# Patient Record
Sex: Male | Born: 1945 | Race: Black or African American | Hispanic: No | Marital: Single | Smoking: Former smoker
Health system: Southern US, Community
[De-identification: ages and names within clinical notes are randomized; demographics above are authoritative.]

## PROBLEM LIST (undated history)

## (undated) DIAGNOSIS — E785 Hyperlipidemia, unspecified: Secondary | ICD-10-CM

## (undated) DIAGNOSIS — I482 Chronic atrial fibrillation, unspecified: Secondary | ICD-10-CM

## (undated) DIAGNOSIS — E119 Type 2 diabetes mellitus without complications: Secondary | ICD-10-CM

## (undated) DIAGNOSIS — I10 Essential (primary) hypertension: Secondary | ICD-10-CM

## (undated) DIAGNOSIS — I428 Other cardiomyopathies: Secondary | ICD-10-CM

## (undated) HISTORY — PX: CARDIAC DEFIBRILLATOR PLACEMENT: SHX171

---

## 2006-01-05 ENCOUNTER — Emergency Department: Payer: Self-pay | Admitting: Emergency Medicine

## 2006-01-05 ENCOUNTER — Other Ambulatory Visit: Payer: Self-pay

## 2015-11-11 ENCOUNTER — Encounter: Payer: Self-pay | Admitting: Emergency Medicine

## 2015-11-11 ENCOUNTER — Inpatient Hospital Stay
Admission: EM | Admit: 2015-11-11 | Discharge: 2015-11-23 | DRG: 357 | Disposition: A | Discharge status: Home / Self Care | Payer: Medicare Other | Attending: Surgery | Admitting: Surgery

## 2015-11-11 ENCOUNTER — Inpatient Hospital Stay: Payer: Medicare Other

## 2015-11-11 ENCOUNTER — Emergency Department: Payer: Medicare Other

## 2015-11-11 ENCOUNTER — Encounter
Admission: EM | Disposition: A | Discharge status: Home / Self Care | Payer: Self-pay | Source: Home / Self Care | Attending: Surgery

## 2015-11-11 DIAGNOSIS — E43 Unspecified severe protein-calorie malnutrition: Secondary | ICD-10-CM | POA: Insufficient documentation

## 2015-11-11 DIAGNOSIS — I509 Heart failure, unspecified: Secondary | ICD-10-CM

## 2015-11-11 DIAGNOSIS — E1122 Type 2 diabetes mellitus with diabetic chronic kidney disease: Secondary | ICD-10-CM | POA: Diagnosis present

## 2015-11-11 DIAGNOSIS — K9189 Other postprocedural complications and disorders of digestive system: Secondary | ICD-10-CM

## 2015-11-11 DIAGNOSIS — I959 Hypotension, unspecified: Secondary | ICD-10-CM | POA: Diagnosis present

## 2015-11-11 DIAGNOSIS — Z7901 Long term (current) use of anticoagulants: Secondary | ICD-10-CM | POA: Diagnosis not present

## 2015-11-11 DIAGNOSIS — E118 Type 2 diabetes mellitus with unspecified complications: Secondary | ICD-10-CM

## 2015-11-11 DIAGNOSIS — I482 Chronic atrial fibrillation, unspecified: Secondary | ICD-10-CM

## 2015-11-11 DIAGNOSIS — I420 Dilated cardiomyopathy: Secondary | ICD-10-CM

## 2015-11-11 DIAGNOSIS — I130 Hypertensive heart and chronic kidney disease with heart failure and stage 1 through stage 4 chronic kidney disease, or unspecified chronic kidney disease: Secondary | ICD-10-CM | POA: Diagnosis present

## 2015-11-11 DIAGNOSIS — N183 Chronic kidney disease, stage 3 (moderate): Secondary | ICD-10-CM | POA: Diagnosis present

## 2015-11-11 DIAGNOSIS — D729 Disorder of white blood cells, unspecified: Secondary | ICD-10-CM

## 2015-11-11 DIAGNOSIS — Z9114 Patient's other noncompliance with medication regimen: Secondary | ICD-10-CM | POA: Diagnosis not present

## 2015-11-11 DIAGNOSIS — Z95810 Presence of automatic (implantable) cardiac defibrillator: Secondary | ICD-10-CM

## 2015-11-11 DIAGNOSIS — E1140 Type 2 diabetes mellitus with diabetic neuropathy, unspecified: Secondary | ICD-10-CM | POA: Diagnosis present

## 2015-11-11 DIAGNOSIS — E876 Hypokalemia: Secondary | ICD-10-CM | POA: Diagnosis present

## 2015-11-11 DIAGNOSIS — K56609 Unspecified intestinal obstruction, unspecified as to partial versus complete obstruction: Secondary | ICD-10-CM

## 2015-11-11 DIAGNOSIS — R140 Abdominal distension (gaseous): Secondary | ICD-10-CM

## 2015-11-11 DIAGNOSIS — K559 Vascular disorder of intestine, unspecified: Secondary | ICD-10-CM

## 2015-11-11 DIAGNOSIS — Z87891 Personal history of nicotine dependence: Secondary | ICD-10-CM | POA: Diagnosis not present

## 2015-11-11 DIAGNOSIS — Z880 Allergy status to penicillin: Secondary | ICD-10-CM

## 2015-11-11 DIAGNOSIS — K5660 Unspecified intestinal obstruction: Secondary | ICD-10-CM | POA: Diagnosis present

## 2015-11-11 DIAGNOSIS — K567 Ileus, unspecified: Secondary | ICD-10-CM | POA: Diagnosis present

## 2015-11-11 DIAGNOSIS — Z79899 Other long term (current) drug therapy: Secondary | ICD-10-CM

## 2015-11-11 DIAGNOSIS — Z833 Family history of diabetes mellitus: Secondary | ICD-10-CM | POA: Diagnosis not present

## 2015-11-11 DIAGNOSIS — E785 Hyperlipidemia, unspecified: Secondary | ICD-10-CM | POA: Diagnosis present

## 2015-11-11 DIAGNOSIS — I10 Essential (primary) hypertension: Secondary | ICD-10-CM

## 2015-11-11 DIAGNOSIS — I5022 Chronic systolic (congestive) heart failure: Secondary | ICD-10-CM

## 2015-11-11 DIAGNOSIS — Z4659 Encounter for fitting and adjustment of other gastrointestinal appliance and device: Secondary | ICD-10-CM

## 2015-11-11 DIAGNOSIS — Z794 Long term (current) use of insulin: Secondary | ICD-10-CM

## 2015-11-11 DIAGNOSIS — I472 Ventricular tachycardia: Secondary | ICD-10-CM | POA: Diagnosis present

## 2015-11-11 DIAGNOSIS — K55029 Acute infarction of small intestine, extent unspecified: Principal | ICD-10-CM

## 2015-11-11 DIAGNOSIS — I493 Ventricular premature depolarization: Secondary | ICD-10-CM | POA: Diagnosis present

## 2015-11-11 DIAGNOSIS — R1030 Lower abdominal pain, unspecified: Secondary | ICD-10-CM

## 2015-11-11 DIAGNOSIS — Z9119 Patient's noncompliance with other medical treatment and regimen: Secondary | ICD-10-CM

## 2015-11-11 HISTORY — DX: Other cardiomyopathies: I42.8

## 2015-11-11 HISTORY — DX: Hyperlipidemia, unspecified: E78.5

## 2015-11-11 HISTORY — DX: Essential (primary) hypertension: I10

## 2015-11-11 HISTORY — PX: PERIPHERAL VASCULAR CATHETERIZATION: SHX172C

## 2015-11-11 HISTORY — DX: Chronic atrial fibrillation, unspecified: I48.20

## 2015-11-11 HISTORY — DX: Type 2 diabetes mellitus without complications: E11.9

## 2015-11-11 LAB — COMPREHENSIVE METABOLIC PANEL
ALK PHOS: 92 U/L (ref 38–126)
ALT: 23 U/L (ref 17–63)
AST: 26 U/L (ref 15–41)
Albumin: 4 g/dL (ref 3.5–5.0)
Anion gap: 7 (ref 5–15)
BUN: 27 mg/dL — AB (ref 6–20)
CALCIUM: 8.8 mg/dL — AB (ref 8.9–10.3)
CO2: 24 mmol/L (ref 22–32)
CREATININE: 1.54 mg/dL — AB (ref 0.61–1.24)
Chloride: 104 mmol/L (ref 101–111)
GFR, EST AFRICAN AMERICAN: 52 mL/min — AB (ref >60)
GFR, EST NON AFRICAN AMERICAN: 45 mL/min — AB (ref >60)
Glucose, Bld: 267 mg/dL — ABNORMAL HIGH (ref 65–99)
Potassium: 4 mmol/L (ref 3.5–5.1)
Sodium: 135 mmol/L (ref 135–145)
Total Bilirubin: 1.6 mg/dL — ABNORMAL HIGH (ref 0.3–1.2)
Total Protein: 7.9 g/dL (ref 6.5–8.1)

## 2015-11-11 LAB — URINALYSIS COMPLETE WITH MICROSCOPIC (ARMC ONLY)
Bilirubin Urine: NEGATIVE
Glucose, UA: 50 mg/dL — AB
Leukocytes, UA: NEGATIVE
Nitrite: NEGATIVE
PH: 5 (ref 5.0–8.0)
SPECIFIC GRAVITY, URINE: 1.015 (ref 1.005–1.030)
SQUAMOUS EPITHELIAL / LPF: NONE SEEN

## 2015-11-11 LAB — LIPASE, BLOOD: Lipase: 26 U/L (ref 11–51)

## 2015-11-11 LAB — CBC
HCT: 41.2 % (ref 40.0–52.0)
Hemoglobin: 13.6 g/dL (ref 13.0–18.0)
MCH: 27.6 pg (ref 26.0–34.0)
MCHC: 33 g/dL (ref 32.0–36.0)
MCV: 83.8 fL (ref 80.0–100.0)
PLATELETS: 175 10*3/uL (ref 150–440)
RBC: 4.92 MIL/uL (ref 4.40–5.90)
RDW: 17.2 % — AB (ref 11.5–14.5)
WBC: 10.7 10*3/uL — AB (ref 3.8–10.6)

## 2015-11-11 LAB — MRSA PCR SCREENING: MRSA BY PCR: NEGATIVE

## 2015-11-11 LAB — APTT: aPTT: 34 seconds (ref 24–36)

## 2015-11-11 LAB — GLUCOSE, CAPILLARY
GLUCOSE-CAPILLARY: 234 mg/dL — AB (ref 65–99)
Glucose-Capillary: 246 mg/dL — ABNORMAL HIGH (ref 65–99)
Glucose-Capillary: 242 mg/dL — ABNORMAL HIGH (ref 65–99)

## 2015-11-11 LAB — PROTIME-INR
INR: 2.28
Prothrombin Time: 24.9 seconds — ABNORMAL HIGH (ref 11.4–15.0)

## 2015-11-11 LAB — TROPONIN I: Troponin I: 0.03 ng/mL (ref <0.03)

## 2015-11-11 LAB — LACTIC ACID, PLASMA: Lactic Acid, Venous: 1.9 mmol/L (ref 0.5–1.9)

## 2015-11-11 LAB — HEPARIN LEVEL (UNFRACTIONATED): HEPARIN UNFRACTIONATED: 2 [IU]/mL — AB (ref 0.30–0.70)

## 2015-11-11 SURGERY — VISCERAL ARTERY INTERVENTION
Anesthesia: Moderate Sedation

## 2015-11-11 MED ORDER — HEPARIN (PORCINE) IN NACL 2-0.9 UNIT/ML-% IJ SOLN
INTRAMUSCULAR | Status: AC
  Filled 2015-11-11: qty 1000

## 2015-11-11 MED ORDER — FAMOTIDINE IN NACL 20-0.9 MG/50ML-% IV SOLN
20.0000 mg | Freq: Two times a day (BID) | INTRAVENOUS | Status: DC
  Administered 2015-11-11 – 2015-11-14 (×6): 20 mg via INTRAVENOUS
  Filled 2015-11-11 (×9): qty 50

## 2015-11-11 MED ORDER — LIDOCAINE-EPINEPHRINE (PF) 1 %-1:200000 IJ SOLN
INTRAMUSCULAR | Status: DC | PRN
  Administered 2015-11-11: 10 mL

## 2015-11-11 MED ORDER — METRONIDAZOLE IN NACL 5-0.79 MG/ML-% IV SOLN
500.0000 mg | Freq: Once | INTRAVENOUS | Status: AC
  Administered 2015-11-11: 500 mg via INTRAVENOUS
  Filled 2015-11-11: qty 100

## 2015-11-11 MED ORDER — DIATRIZOATE MEGLUMINE & SODIUM 66-10 % PO SOLN
15.0000 mL | Freq: Once | ORAL | Status: AC
  Administered 2015-11-11: 15 mL via ORAL

## 2015-11-11 MED ORDER — CIPROFLOXACIN IN D5W 400 MG/200ML IV SOLN
400.0000 mg | Freq: Two times a day (BID) | INTRAVENOUS | Status: DC
  Administered 2015-11-12 – 2015-11-15 (×7): 400 mg via INTRAVENOUS
  Filled 2015-11-11 (×9): qty 200

## 2015-11-11 MED ORDER — CHLORHEXIDINE GLUCONATE CLOTH 2 % EX PADS: 6.0000 | MEDICATED_PAD | Freq: Once | CUTANEOUS | Status: DC

## 2015-11-11 MED ORDER — MORPHINE SULFATE (PF) 2 MG/ML IV SOLN
2.0000 mg | INTRAVENOUS | Status: DC | PRN
  Administered 2015-11-11 – 2015-11-12 (×3): 2 mg via INTRAVENOUS
  Filled 2015-11-11 (×3): qty 1

## 2015-11-11 MED ORDER — ONDANSETRON HCL 4 MG/2ML IJ SOLN
4.0000 mg | Freq: Once | INTRAMUSCULAR | Status: AC
  Administered 2015-11-11: 4 mg via INTRAVENOUS
  Filled 2015-11-11: qty 2

## 2015-11-11 MED ORDER — MORPHINE SULFATE (PF) 4 MG/ML IV SOLN
4.0000 mg | Freq: Once | INTRAVENOUS | Status: AC
  Administered 2015-11-11: 4 mg via INTRAVENOUS
  Filled 2015-11-11: qty 1

## 2015-11-11 MED ORDER — HEPARIN BOLUS VIA INFUSION
4000.0000 [IU] | Freq: Once | INTRAVENOUS | Status: DC
  Filled 2015-11-11: qty 4000

## 2015-11-11 MED ORDER — IOPAMIDOL (ISOVUE-300) INJECTION 61%
100.0000 mL | Freq: Once | INTRAVENOUS | Status: AC | PRN
  Administered 2015-11-11: 100 mL via INTRAVENOUS

## 2015-11-11 MED ORDER — ALTEPLASE 2 MG IJ SOLR
INTRAMUSCULAR | Status: DC | PRN
  Administered 2015-11-11: 4 mg

## 2015-11-11 MED ORDER — FENTANYL CITRATE (PF) 100 MCG/2ML IJ SOLN
INTRAMUSCULAR | Status: AC
  Filled 2015-11-11: qty 4

## 2015-11-11 MED ORDER — CIPROFLOXACIN IN D5W 400 MG/200ML IV SOLN
INTRAVENOUS | Status: AC
  Filled 2015-11-11: qty 200

## 2015-11-11 MED ORDER — CIPROFLOXACIN IN D5W 400 MG/200ML IV SOLN
400.0000 mg | Freq: Once | INTRAVENOUS | Status: AC
  Administered 2015-11-11: 400 mg via INTRAVENOUS
  Filled 2015-11-11: qty 200

## 2015-11-11 MED ORDER — IOPAMIDOL (ISOVUE-300) INJECTION 61%
INTRAVENOUS | Status: DC | PRN
Start: 2015-11-11 — End: 2015-11-11
  Administered 2015-11-11: 55 mL via INTRA_ARTERIAL

## 2015-11-11 MED ORDER — LIDOCAINE-EPINEPHRINE (PF) 1 %-1:200000 IJ SOLN
INTRAMUSCULAR | Status: AC
  Filled 2015-11-11: qty 30

## 2015-11-11 MED ORDER — SODIUM CHLORIDE 0.9 % IV BOLUS (SEPSIS)
1000.0000 mL | Freq: Once | INTRAVENOUS | Status: AC
  Administered 2015-11-11: 1000 mL via INTRAVENOUS

## 2015-11-11 MED ORDER — SODIUM CHLORIDE 0.9 % IV SOLN
INTRAVENOUS | Status: DC
  Administered 2015-11-12: 07:00:00 via INTRAVENOUS

## 2015-11-11 MED ORDER — FENTANYL CITRATE (PF) 100 MCG/2ML IJ SOLN
INTRAMUSCULAR | Status: DC | PRN
  Administered 2015-11-11: 50 ug via INTRAVENOUS

## 2015-11-11 MED ORDER — HYDRALAZINE HCL 20 MG/ML IJ SOLN: 10.0000 mg | INTRAMUSCULAR | Status: DC | PRN

## 2015-11-11 MED ORDER — SODIUM CHLORIDE 0.9 % IV BOLUS (SEPSIS): 1000.0000 mL | Freq: Once | INTRAVENOUS | Status: DC

## 2015-11-11 MED ORDER — MIDAZOLAM HCL 5 MG/5ML IJ SOLN
INTRAMUSCULAR | Status: AC
  Filled 2015-11-11: qty 10

## 2015-11-11 MED ORDER — ONDANSETRON HCL 4 MG/2ML IJ SOLN: 4.0000 mg | Freq: Four times a day (QID) | INTRAMUSCULAR | Status: DC | PRN

## 2015-11-11 MED ORDER — INSULIN ASPART 100 UNIT/ML ~~LOC~~ SOLN: 0.0000 [IU] | Freq: Three times a day (TID) | SUBCUTANEOUS | Status: DC

## 2015-11-11 MED ORDER — SODIUM CHLORIDE 0.9 % IV SOLN
INTRAVENOUS | Status: DC
  Administered 2015-11-11: 18:00:00 via INTRAVENOUS

## 2015-11-11 MED ORDER — HEPARIN (PORCINE) IN NACL 100-0.45 UNIT/ML-% IJ SOLN: 14.0000 [IU]/kg/h | INTRAMUSCULAR | Status: DC

## 2015-11-11 MED ORDER — HEPARIN (PORCINE) IN NACL 100-0.45 UNIT/ML-% IJ SOLN
1400.0000 [IU]/h | INTRAMUSCULAR | Status: DC
  Administered 2015-11-11: 1000 [IU]/h via INTRAVENOUS
  Administered 2015-11-12: 1300 [IU]/h via INTRAVENOUS
  Administered 2015-11-13: 1400 [IU]/h via INTRAVENOUS
  Filled 2015-11-11 (×6): qty 250

## 2015-11-11 MED ORDER — ONDANSETRON 4 MG PO TBDP
4.0000 mg | ORAL_TABLET | Freq: Four times a day (QID) | ORAL | Status: DC | PRN
  Administered 2015-11-15: 4 mg via ORAL
  Filled 2015-11-11 (×2): qty 1

## 2015-11-11 MED ORDER — METRONIDAZOLE IN NACL 5-0.79 MG/ML-% IV SOLN
500.0000 mg | Freq: Three times a day (TID) | INTRAVENOUS | Status: DC
  Administered 2015-11-11 – 2015-11-15 (×11): 500 mg via INTRAVENOUS
  Filled 2015-11-11 (×13): qty 100

## 2015-11-11 MED ORDER — MIDAZOLAM HCL 2 MG/2ML IJ SOLN
INTRAMUSCULAR | Status: DC | PRN
  Administered 2015-11-11: 2 mg via INTRAVENOUS

## 2015-11-11 MED ORDER — IPRATROPIUM-ALBUTEROL 0.5-2.5 (3) MG/3ML IN SOLN
3.0000 mL | Freq: Four times a day (QID) | RESPIRATORY_TRACT | Status: DC
  Administered 2015-11-11 – 2015-11-14 (×10): 3 mL via RESPIRATORY_TRACT
  Filled 2015-11-11 (×9): qty 3

## 2015-11-11 SURGICAL SUPPLY — 20 items
BALLN ULTRVRSE 3X80X75 (BALLOONS) ×3
BALLN ULTRVRSE 5X80X75C (BALLOONS) ×3
BALLOON ULTRVRSE 3X80X75 (BALLOONS) ×1 IMPLANT
BALLOON ULTRVRSE 5X80X75C (BALLOONS) ×1 IMPLANT
CATH 5FR REUT (CATHETERS) ×3 IMPLANT
CATH KA2 5FR 65CM (CATHETERS) ×3 IMPLANT
CATH PIG 70CM (CATHETERS) ×3 IMPLANT
DEVICE PRESTO INFLATION (MISCELLANEOUS) ×6 IMPLANT
DEVICE SAFEGUARD 24CM (GAUZE/BANDAGES/DRESSINGS) ×3 IMPLANT
DEVICE SOLENT PROXI 90CM (CATHETERS) ×3 IMPLANT
DEVICE STARCLOSE SE CLOSURE (Vascular Products) ×3 IMPLANT
GLIDEWIRE STIFF .35X180X3 HYDR (WIRE) ×3 IMPLANT
PACK ANGIOGRAPHY (CUSTOM PROCEDURE TRAY) ×6 IMPLANT
SHEATH ANL2 6FRX45 HC (SHEATH) ×3 IMPLANT
SHEATH BRITE TIP 5FRX11 (SHEATH) ×3 IMPLANT
SYR MEDRAD MARK V 150ML (SYRINGE) ×3 IMPLANT
TOWEL OR 17X26 4PK STRL BLUE (TOWEL DISPOSABLE) ×3 IMPLANT
TUBING CONTRAST HIGH PRESS 72 (TUBING) ×3 IMPLANT
WIRE J 3MM .035X145CM (WIRE) ×3 IMPLANT
WIRE MAGIC TOR.035 180C (WIRE) ×3 IMPLANT

## 2015-11-11 NOTE — Consult Note (Signed)
[facility] Vascular Consult Note  MRN : [id number]  Alex Vance is a 70 y.o. (07-04-1945) male who presents with chief complaint of  Chief Complaint  Patient presents with  . Dizziness  . Abdominal Pain  .  History of Present Illness: I am asked by Dr. Reita Cliche in the emergency department to see the patient regarding his abdominal pain. Patient presents to the emergency room with 2 day history of abdominal pain. It became most severe overnight and this morning. It is a dull, aching abdominal pain that is essentially unrelenting. It encompasses most of his abdomen. It has a crampy nature to it as well. He reports nausea and poor appetite as well. He has no fever. He has a long history of significant heart disease, and has a known dilated cardiomyopathy and congestive heart failure as well as atrial fibrillation. As part of his workup in the emergency room he had a CT scan which I have independently reviewed. This demonstrates an occlusion of the SMA beyond its second branch without significant atherosclerotic disease of the SMA consistent with embolic occlusion of the SMA. The celiac and IMA appear patent. There is associated bowel wall thickening consistent with intestinal ischemia. No pneumatosis or portal venous air was seen.  Current Facility-Administered Medications  Medication Dose Route Frequency Provider Last Rate Last Dose  . ciprofloxacin (CIPRO) 400 MG/200ML IVPB           . 0.9 %  sodium chloride infusion   Intravenous Continuous Diego F Pabon, MD      . 0.9 %  sodium chloride infusion   Intravenous Continuous Algernon Huxley, MD      . Chlorhexidine Gluconate Cloth 2 % PADS 6 each  6 each Topical Once Algernon Huxley, MD      . Derrill Memo ON 11/12/2015] ciprofloxacin (CIPRO) IVPB 400 mg  400 mg Intravenous Q12H Diego F Pabon, MD      . famotidine (PEPCID) IVPB 20 mg premix  20 mg Intravenous Q12H Diego F Pabon, MD      . fentaNYL (SUBLIMAZE) 100 MCG/2ML injection            . heparin bolus via infusion 4,000 Units  4,000 Units Intravenous Once Sheema M Hallaji, RPH       Followed by  . heparin ADULT infusion 100 units/mL (25000 units/274mL sodium chloride 0.45%)  1,000 Units/hr Intravenous Continuous Sheema M Hallaji, RPH 10 mL/hr at 11/11/15 1624 1,000 Units/hr at 11/11/15 1624  . hydrALAZINE (APRESOLINE) injection 10 mg  10 mg Intravenous Q2H PRN Jules Husbands, MD      . Derrill Memo ON 11/12/2015] insulin aspart (novoLOG) injection 0-15 Units  0-15 Units Subcutaneous TID WC Diego F Pabon, MD      . ipratropium-albuterol (DUONEB) 0.5-2.5 (3) MG/3ML nebulizer solution 3 mL  3 mL Nebulization QID Idelle Crouch, MD   3 mL at 11/11/15 1807  . [START ON 11/12/2015] metroNIDAZOLE (FLAGYL) IVPB 500 mg  500 mg Intravenous Q8H Diego F Pabon, MD      . midazolam (VERSED) 5 MG/5ML injection           . morphine 2 MG/ML injection 2 mg  2 mg Intravenous Q2H PRN Diego F Pabon, MD      . ondansetron (ZOFRAN-ODT) disintegrating tablet 4 mg  4 mg Oral Q6H PRN Diego F Pabon, MD       Or  . ondansetron (ZOFRAN) injection 4 mg  4 mg Intravenous Q6H PRN Diego  F Pabon, MD      . sodium chloride 0.9 % bolus 1,000 mL  1,000 mL Intravenous Once Jules Husbands, MD        Past Medical History:  Diagnosis Date  . Diabetes mellitus without complication ([place])   . Hyperlipemia   . Hypertension     Past Surgical History:  Procedure Laterality Date  . CARDIAC DEFIBRILLATOR PLACEMENT      Social History Social History  Substance Use Topics  . Smoking status: Former Research scientist (life sciences)  . Smokeless tobacco: Never Used  . Alcohol use No  Married, spouse accompanies today.  Family History No family history of bleeding disorders, clotting disorders, autoimmune diseases, or aneurysms  Allergies  Allergen Reactions  . Penicillins Rash    Has patient had a PCN reaction causing immediate rash, facial/tongue/throat swelling, SOB or lightheadedness with hypotension: Yes Has patient had a PCN  reaction causing severe rash involving mucus membranes or skin necrosis: No Has patient had a PCN reaction that required hospitalization No Has patient had a PCN reaction occurring within the last 10 years: No If all of the above answers are "NO", then may proceed with Cephalosporin use.     REVIEW OF SYSTEMS (Negative unless checked)  Constitutional: [] Weight loss  [] Fever  [] Chills Cardiac: [] Chest pain   [] Chest pressure   [x] Palpitations   [] Shortness of breath when laying flat   [] Shortness of breath at rest   [x] Shortness of breath with exertion. Vascular:  [] Pain in legs with walking   [] Pain in legs at rest   [] Pain in legs when laying flat   [] Claudication   [] Pain in feet when walking  [] Pain in feet at rest  [] Pain in feet when laying flat   [] History of DVT   [] Phlebitis   [x] Swelling in legs   [] Varicose veins   [] Non-healing ulcers Pulmonary:   [] Uses home oxygen   [] Productive cough   [] Hemoptysis   [] Wheeze  [] COPD   [] Asthma Neurologic:  [] Dizziness  [] Blackouts   [] Seizures   [] History of stroke   [] History of TIA  [] Aphasia   [] Temporary blindness   [] Dysphagia   [] Weakness or numbness in arms   [] Weakness or numbness in legs Musculoskeletal:  [] Arthritis   [] Joint swelling   [] Joint pain   [] Low back pain Hematologic:  [] Easy bruising  [] Easy bleeding   [] Hypercoagulable state   [] Anemic  [] Hepatitis Gastrointestinal:  [] Blood in stool   [] Vomiting blood  [] Gastroesophageal reflux/heartburn   [] Difficulty swallowing. Genitourinary:  [x] Chronic kidney disease   [] Difficult urination  [] Frequent urination  [] Burning with urination   [] Blood in urine Skin:  [] Rashes   [] Ulcers   [] Wounds Psychological:  [] History of anxiety   []  History of major depression.  Physical Examination  Vitals:   11/11/15 1500 11/11/15 1630 11/11/15 1645 11/11/15 1744  BP: (!) 170/146 (!) 165/93  (!) 152/91  Pulse: 65   63  Resp: (!) 23 (!) 22  20  Temp:    97.9 F (36.6 C)  TempSrc:     Axillary  SpO2: 98%  97% 99%  Weight:    111.9 kg (246 lb 11.1 oz)  Height:    6\' 2"  (1.88 m)   Body mass index is 31.67 kg/m. Gen:  WD/WN,Obese African-American male who appears uncomfortable in his bed Head: [place]/AT, No temporalis wasting. Prominent temp pulse not noted. Ear/Nose/Throat: Hearing grossly intact, nares w/o erythema or drainage, oropharynx w/o Erythema/Exudate Eyes: PERRLA, EOMI.  Neck: Supple, no nuchal rigidity.  No JVD.  Pulmonary:  Good air movement, equal bilaterally.  Cardiac: Irregularly irregular Vascular:  Vessel Right Left  Radial Palpable Palpable  Ulnar Palpable Palpable  Brachial Palpable Palpable  Carotid Palpable, without bruit Palpable, without bruit  Aorta Not palpable N/A  Femoral Palpable Palpable  Popliteal Palpable Not Palpable  PT Not Palpable Not Palpable  DP Palpable Palpable   Gastrointestinal: soft, No guarding or rebound but the patient is diffusely tender throughout his abdomen. Musculoskeletal: M/S 5/5 throughout.  Extremities without ischemic changes.  No deformity or atrophy. 2+ bilateral lower extremity edema Neurologic: CN 2-12 intact. Pain and light touch intact in extremities.  Symmetrical.  Speech is fluent. Motor exam as listed above. Psychiatric: Judgment intact, Mood & affect appropriate for pt's clinical situation. Dermatologic: No rashes or ulcers noted.  No cellulitis or open wounds. Lymph : No Cervical, Axillary, or Inguinal lymphadenopathy.      CBC Lab Results  Component Value Date   WBC 10.7 (H) 11/11/2015   HGB 13.6 11/11/2015   HCT 41.2 11/11/2015   MCV 83.8 11/11/2015   PLT 175 11/11/2015    BMET    Component Value Date/Time   NA 135 11/11/2015 1020   K 4.0 11/11/2015 1020   CL 104 11/11/2015 1020   CO2 24 11/11/2015 1020   GLUCOSE 267 (H) 11/11/2015 1020   BUN 27 (H) 11/11/2015 1020   CREATININE 1.54 (H) 11/11/2015 1020   CALCIUM 8.8 (L) 11/11/2015 1020   GFRNONAA 45 (L) 11/11/2015 1020   GFRAA  52 (L) 11/11/2015 1020   Estimated Creatinine Clearance: 62 mL/min (by C-G formula based on SCr of 1.54 mg/dL).  COAG Lab Results  Component Value Date   INR 2.28 11/11/2015    Radiology Dg Chest 2 View  Result Date: 11/11/2015 CLINICAL DATA:  70 year old male with CHF. EXAM: CHEST  2 VIEW COMPARISON:  01/05/2006 chest radiograph FINDINGS: Cardiomegaly and mild pulmonary vascular congestion noted. A left pacemaker/ ICD noted with tips overlying the right atrium and right ventricle. An NG tube is present with tip difficult to visualize. Mild bibasilar atelectasis noted. There is no evidence of pneumothorax or definite pleural effusion. IMPRESSION: Cardiomegaly with mild pulmonary vascular congestion and mild bibasilar atelectasis. Electronically Signed   By: Margarette Canada M.D.   On: 11/11/2015 17:46  Ct Abdomen Pelvis W Contrast  Result Date: 11/11/2015 CLINICAL DATA:  Lower abdominal pain for 2 days. EXAM: CT ABDOMEN AND PELVIS WITH CONTRAST TECHNIQUE: Multidetector CT imaging of the abdomen and pelvis was performed using the standard protocol following bolus administration of intravenous contrast. CONTRAST:  111mL ISOVUE-300 IOPAMIDOL (ISOVUE-300) INJECTION 61% COMPARISON:  None. FINDINGS: Mild atelectasis in the lung bases. No other acute abnormalities seen within the lung bases. There is a fat containing umbilical hernia. A small amount of free fluid is seen within the pelvis. The stomach is mildly distended. The duodenum is normal in appearance. There is dilatation of small bowel from the proximal jejunum to the distal jejunum or proximal ileum. The most proximal dilated loops of small bowel such as on series 2, image 38 are also thick walled and edematous in appearance. The thick walled small bowel covers a long segment. Beyond the transition point, best seen on coronal image 32, the small bowel is decompressed. Colonic diverticulosis is seen without diverticulitis. The appendix is normal in  appearance. Atherosclerosis is seen in the non aneurysmal aorta. There is hepatic steatosis. The gallbladder, portal vein, and adrenal glands are normal. No acute abnormalities  seen in the spleen. The pancreas is normal in appearance as well. The small cysts are seen in the kidneys with no hydronephrosis, stones, or suspicious masses. No adenopathy identified in the abdomen. The abdominal aorta is atherosclerotic. There is an abrupt cut off of the SMA as seen on axial image 44 and coronal image 65 with re- cannulization more distally. This is consistent with thrombus in the SMA, likely resulting in the thickened small bowel loops. The thrombus covers a distance of 4.7 cm. The pelvis demonstrates a small amount of free fluid. No adenopathy or mass. The bladder, prostate, and seminal vesicles are normal. The visualized bones are normal. IMPRESSION: 1. There is a small bowel obstruction with the transition point in the left lower quadrant. The thick walled loops of proximal small bowel are likely due to the SMA thrombus resulting in ischemia within these loops. 2. Low-attenuation in the right hepatic lobe on delayed images 1 and 2 is nonspecific and not seen on portal venous phase imaging. Recommend comparison with older films if available. 3. Prominent nodes in the gastrohepatic ligament and porta hepatis are likely reactive. Findings, including SMA thrombus and likely ischemic small bowel loops, were called to Dr. Reita Cliche at 3:45 p.m. Electronically Signed   By: Dorise Bullion III M.D   On: 11/11/2015 15:52    Assessment/Plan 1. 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 Abdominal pain likely due to intestinal ischemia from this.  Have discussed with Dr. Dahlia Byes and will attempt revascularization this evening.  Believe best option will be endovascular approach, and this was discussed with the patient.  May ultimately still require bowel resection, but will monitor closely.  Continue anticoagulation.  To angio  ASAP. 2. Dilated cardiomyopathy/CHF. Likely the cause of number 1. Agree with Echo and heparin drip 3. DM. Monitor sugars.  He has some atherosclerotic disease but the SMA is largely spared.  4. A. Fib. Along with number two, the cause of the embolus.  Agree with anticoagulation and Echo.   Ladawn Boullion, MD  11/11/2015 6:23 PM

## 2015-11-11 NOTE — Consult Note (Signed)
History and Physical    Maksymilian Steimle [id number] DOB: Sep 23, 1945 DOA: 11/11/2015  Referring physician: Dr. Dahlia Byes PCP: No primary care provider on file.  Specialists: none  Chief Complaint: abdominal pain with N/V/D  HPI: Nollie Dipirro is a 70 y.o. male has a past medical history significant for chronic a-fib on coumadin, dilated cardiomyopathy with AICD in place, Dm, and HTN being admitted by Surgery today for intestinal ischemia. Currently therapeutic on coumadin. Now on Heparin drip per Surgery. Denis CP or SOB. BP elevated but heart rate well controlled. Has mild CKD as well. Sugars are stable  Review of Systems: The patient denies anorexia, fever, weight loss,, vision loss, decreased hearing, hoarseness, chest pain, syncope, dyspnea on exertion,  balance deficits, hemoptysis,  melena, hematochezia, severe indigestion/heartburn, hematuria, incontinence, genital sores, muscle weakness, suspicious skin lesions, transient blindness, difficulty walking, depression, unusual weight change, abnormal bleeding, enlarged lymph nodes, angioedema, and breast masses.   Past Medical History:  Diagnosis Date  . Diabetes mellitus without complication ([place])   . Hyperlipemia   . Hypertension    Past Surgical History:  Procedure Laterality Date  . CARDIAC DEFIBRILLATOR PLACEMENT     Social History:  reports that he has quit smoking. He has never used smokeless tobacco. He reports that he does not drink alcohol or use drugs.  Allergies  Allergen Reactions  . Penicillins Rash    Has patient had a PCN reaction causing immediate rash, facial/tongue/throat swelling, SOB or lightheadedness with hypotension: Yes Has patient had a PCN reaction causing severe rash involving mucus membranes or skin necrosis: No Has patient had a PCN reaction that required hospitalization No Has patient had a PCN reaction occurring within the last 10 years: No If all of the above answers are "NO", then may proceed  with Cephalosporin use.    No family history on file.  Prior to Admission medications   Medication Sig Start Date End Date Taking? Authorizing Provider  carvedilol (COREG) 25 MG tablet Take 25 mg by mouth 2 (two) times daily. 05/11/15  Yes Historical Provider, MD  furosemide (LASIX) 40 MG tablet Take 40-80 mg by mouth See admin instructions. Take 2 tablets (80 mg) by mouth every morning, and Take 1 tablet by mouth every night at bedtime. 09/24/15  Yes Historical Provider, MD  gabapentin (NEURONTIN) 800 MG tablet Take 800 mg by mouth daily as needed (for pain.).  08/22/15  Yes Historical Provider, MD  LEVEMIR FLEXTOUCH 100 UNIT/ML Pen Inject 65 Units into the skin 2 (two) times daily. 11/09/15  Yes Historical Provider, MD  losartan (COZAAR) 100 MG tablet Take 100 mg by mouth daily. 08/22/15  Yes Historical Provider, MD  metFORMIN (GLUCOPHAGE) 500 MG tablet Take 1,000 mg by mouth 2 (two) times daily with a meal. 05/11/15  Yes Historical Provider, MD  omeprazole (PRILOSEC) 20 MG capsule Take 20 mg by mouth daily. 09/24/15  Yes Historical Provider, MD  simvastatin (ZOCOR) 20 MG tablet Take 20 mg by mouth at bedtime.   Yes Historical Provider, MD  spironolactone (ALDACTONE) 25 MG tablet Take 25 mg by mouth daily. 11/08/15  Yes Historical Provider, MD  VICTOZA 18 MG/3ML SOPN Inject 1.8 mg into the skin daily. 09/24/15  Yes Historical Provider, MD  XARELTO 20 MG TABS tablet Take 20 mg by mouth at bedtime. 09/24/15  Yes Historical Provider, MD   Physical Exam: Vitals:   11/11/15 1415 11/11/15 1500 11/11/15 1630 11/11/15 1645  BP:  (!) 170/146 (!) 165/93   Pulse: (!) 48  65    Resp: 13 (!) 23 (!) 22   SpO2: 96% 98%  97%  Weight:      Height:         General:  No apparent distress, WDWN, [place]/AT  Eyes: PERRL, EOMI, no scleral icterus, conjunctiva clear  ENT: moist oropharynx without exudate, TM's benign, dentition fair  Neck: supple, no lymphadenopathy. No bruits or thyromegaly  Cardiovascular: irregularly  irregular without MRG; 2+ peripheral pulses, no JVD, 2+ peripheral edema  Respiratory: basilar rales, good air movement without wheezing, rhonchi. No dullness. Respiratory effort normal  Abdomen: slightly distended, tender to palpation, hypoactive bowel sounds,  guarding, no rebound  Skin: no rashes or lesions  Musculoskeletal: normal bulk and tone, no joint swelling  Psychiatric: normal mood and affect, A&OX3  Neurologic: CN 2-12 grossly intact, Motor strength 5/5 in all 4 groups with symmetric DTR's and non-focal sensory exam  Labs on Admission:  Basic Metabolic Panel:  Recent Labs Lab 11/11/15 1020  NA 135  K 4.0  CL 104  CO2 24  GLUCOSE 267*  BUN 27*  CREATININE 1.54*  CALCIUM 8.8*   Liver Function Tests:  Recent Labs Lab 11/11/15 1020  AST 26  ALT 23  ALKPHOS 92  BILITOT 1.6*  PROT 7.9  ALBUMIN 4.0    Recent Labs Lab 11/11/15 1020  LIPASE 26   No results for input(s): AMMONIA in the last 168 hours. CBC:  Recent Labs Lab 11/11/15 1020  WBC 10.7*  HGB 13.6  HCT 41.2  MCV 83.8  PLT 175   Cardiac Enzymes:  Recent Labs Lab 11/11/15 1020  TROPONINI 0.03*    BNP (last 3 results) No results for input(s): BNP in the last 8760 hours.  ProBNP (last 3 results) No results for input(s): PROBNP in the last 8760 hours.  CBG:  Recent Labs Lab 11/11/15 1022  GLUCAP 246*    Radiological Exams on Admission: Ct Abdomen Pelvis W Contrast  Result Date: 11/11/2015 CLINICAL DATA:  Lower abdominal pain for 2 days. EXAM: CT ABDOMEN AND PELVIS WITH CONTRAST TECHNIQUE: Multidetector CT imaging of the abdomen and pelvis was performed using the standard protocol following bolus administration of intravenous contrast. CONTRAST:  145mL ISOVUE-300 IOPAMIDOL (ISOVUE-300) INJECTION 61% COMPARISON:  None. FINDINGS: Mild atelectasis in the lung bases. No other acute abnormalities seen within the lung bases. There is a fat containing umbilical hernia. A small amount  of free fluid is seen within the pelvis. The stomach is mildly distended. The duodenum is normal in appearance. There is dilatation of small bowel from the proximal jejunum to the distal jejunum or proximal ileum. The most proximal dilated loops of small bowel such as on series 2, image 38 are also thick walled and edematous in appearance. The thick walled small bowel covers a long segment. Beyond the transition point, best seen on coronal image 32, the small bowel is decompressed. Colonic diverticulosis is seen without diverticulitis. The appendix is normal in appearance. Atherosclerosis is seen in the non aneurysmal aorta. There is hepatic steatosis. The gallbladder, portal vein, and adrenal glands are normal. No acute abnormalities seen in the spleen. The pancreas is normal in appearance as well. The small cysts are seen in the kidneys with no hydronephrosis, stones, or suspicious masses. No adenopathy identified in the abdomen. The abdominal aorta is atherosclerotic. There is an abrupt cut off of the SMA as seen on axial image 44 and coronal image 65 with re- cannulization more distally. This is consistent with thrombus in  the SMA, likely resulting in the thickened small bowel loops. The thrombus covers a distance of 4.7 cm. The pelvis demonstrates a small amount of free fluid. No adenopathy or mass. The bladder, prostate, and seminal vesicles are normal. The visualized bones are normal. IMPRESSION: 1. There is a small bowel obstruction with the transition point in the left lower quadrant. The thick walled loops of proximal small bowel are likely due to the SMA thrombus resulting in ischemia within these loops. 2. Low-attenuation in the right hepatic lobe on delayed images 1 and 2 is nonspecific and not seen on portal venous phase imaging. Recommend comparison with older films if available. 3. Prominent nodes in the gastrohepatic ligament and porta hepatis are likely reactive. Findings, including SMA thrombus  and likely ischemic small bowel loops, were called to Dr. Reita Cliche at 3:45 p.m. Electronically Signed   By: Dorise Bullion III M.D   On: 11/11/2015 15:52   EKG: Independently reviewed.  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 CXR ordered. Will begin SVN's and prn O2. Please call with questions.  Diet: per Surgery Fluids: per Surgery DVT Prophylaxis: Heparin drip  Code Status: FULL  Family Communication: yes  Disposition Plan: SNF  Time spent: 45 min

## 2015-11-11 NOTE — ED Notes (Signed)
Patient transported to CT 

## 2015-11-11 NOTE — Op Note (Signed)
[facility] Percutaneous Study/Intervention Procedural Note   Date of Surgery: 11/11/2015  Surgeon(s): Janah Mcculloh   Assistants:None  Pre-operative Diagnosis: Acute mesenteric ischemia with SMA occlusion Post-operative diagnosis: Same  Procedure(s) Performed: 1. Ultrasound guidance for vascular access right femoral artery 2. Catheter placement into SMA and into ileocolic branch of SMA from right femoral approach 3. Aortogram and selective SMA angiogram 4. Catheter directed thrombolysis with 4 mg of TPA to the superior mesenteric artery and ileocolic branch of the SMA  5.  Mechanical rheolytic thrombectomy to the SMA and the ileocolic branch of the SMA with the AngioJet proxy catheter  6.  Percutaneous transluminal angioplasty of the superior mesenteric artery in its mid to distal segment with 5 mm diameter by 6 cm length angioplasty balloon  7.  Percutaneous transluminal angioplasty of the ileocolic branch of the SMA with 3 mm diameter by 8 cm length angioplasty balloon 8. StarClose closure device right femoral artery  Anesthesia: local with moderate conscious sedation for approximately 30 minutes using 2 mg of Versed and 50 mcg of Fentanyl  Fluoro Time: 8.5 minutes  Contrast: 55 cc  Indications: Patient is a 70 year old male who presents with acute mesenteric ischemia and a CT scan suggesting occlusion of the SMA.  Angiogram is performed to evaluate the lesion more thoroughly and potentially allow treatment. Risks and benefits were discussed and informed consent was obtained  Procedure: The patient was identified and appropriate procedural time out was performed. The patient was then placed supine on the table and prepped and draped in the usual sterile fashion.Moderate conscious sedation was administered during a face to face encounter with the patient with the RN monitoring  their vital signs, mental status, telemetry and pulse oximetry throughout the procedure.  Ultrasound was used to evaluate the right common femoral artery. It was patent . A digital ultrasound image was acquired. A Seldinger needle was used to access the right common femoral artery under direct ultrasound guidance and a permanent image was performed. A 0.035 J wire was advanced without resistance and a 5Fr sheath was placed. Pigtail catheter was then placed into the aorta and initially an AP aortogram was performed which showed normal origins of the renal artery and what appeared to be normal flow of the celiac artery. Even on the AP aortogram image it was clear that the SMA was occluded beyond its initial branches abruptly. Lateral projection view was performed which showed typical orientations of the celiac and SMA.  I then selective cannulated the SMA with a VS 1 catheter. Selective imaging of the superior mesenteric artery demonstrated Abrupt occlusion just beyond the middle colic vessel consistent with embolization and essentially no flow distally on initial imaging .  I exchanged for a 6 [place] Ansell sheath over a Magic torque wire and used a Kumpe catheter to get out into what was the ileocolic branch of the SMA to perform treatment. I then instilled 4 mg of TPA and a power pulse spray fashion using the AngioJet proxy catheter the mid to distal SMA and the ileocolic branch. This was allowed to dwell for about 10-15 minutes and then I performed mechanical rheolytic thrombectomy to the mid to distal SMA as well as out the ileocolic branch. 3 passes were performed with the AngioJet proxy catheter on separate instances with a total of about 200 cc of effluent returned with the mechanical rheolytic thrombectomy. After the first pass with the AngioJet catheter, there was still a significant lesion in the mid to  distal SMA. I elected to treat this lesion with a 5 mm diameter by 6 cm length angioplasty balloon  to help marginate the thrombus. This was inflated to 8 atm for 1 minute. On completion imaging, the main SMA down to the ileocolic branch now had reasonably flow. 2 more passes were then performed with the AngioJet proxy catheter but after the first when there was still very little flow in the ileocolic branch. The second one there was some flow but still a clearly occlusive thrombus was seen at the origin of the ileocolic branch on the SMA. I elected to treat this area with a 3 mm diameter by 8 cm length angioplasty balloon this was used to treat the ileocolic branch and was inflated to 12 atm for 1 minute. Completion angiogram showed restore patency of this branch with less than 50% residual stenosis. There did still appear to be some abrupt termination some of the tertiary branches out near the bowel, but the main artery was now opened. I felt we had improved her perfusion to her bowel as much as possible and I elected to terminate the procedure. The guide catheter was removed and oblique arteriogram was performed of the right femoral artery. StarClose closure device was deployed in the usual fashion with excellent hemostatic result.  Findings:  Aortogram: normal renal arteries and origins of celiac and SMA.  Normal aorta and iliac arteries SMA: Abrupt occlusion just beyond the middle colic vessel consistent with embolization and essentially no flow distally on initial imaging.     Disposition: Patient was taken to the recovery room in stable condition having tolerated the procedure well.   Alex Vance 11/11/2015 7:45 PM

## 2015-11-11 NOTE — ED Triage Notes (Signed)
Pt reports dizziness for past weeks and for past 4 days abd pain, vomiting and diarrhea. Also c/o ulcer like area to mid back. Family thinks it may have been a bug bite.

## 2015-11-11 NOTE — ED Provider Notes (Addendum)
[facility] Emergency Department Provider Note   ____________________________________________  Time seen: I have reviewed the triage vital signs and the triage nursing note.  HISTORY  Chief Complaint Dizziness and Abdominal Pain   Historian Patient and spouse and daughter  HPI Alex Vance is a 70 y.o. male with a history of hypertension, hyperlipidemia and diabetes is here for abdominal pain and cramping in the mid and lower abdomen also with vomiting and diarrhea since this morning. Watery diarrhea, nonbloody. No bloody or bilious emesis. No fever. No sick contacts. No significant travel or food exposures.  No chest pain or trouble breathing.  Pain is moderate in about 7 or 8 out of 10 at present.    Past Medical History:  Diagnosis Date  . Diabetes mellitus without complication ([place])   . Hyperlipemia   . Hypertension     There are no active problems to display for this patient.   Past Surgical History:  Procedure Laterality Date  . CARDIAC DEFIBRILLATOR PLACEMENT        Allergies Penicillins  No family history on file.  Social History Social History  Substance Use Topics  . Smoking status: Former Research scientist (life sciences)  . Smokeless tobacco: Never Used  . Alcohol use No    Review of Systems  Constitutional: Negative for fever. Eyes: Negative for visual changes. ENT: Negative for sore throat. Cardiovascular: Negative for chest pain. Respiratory: Negative for shortness of breath. Gastrointestinal: Positive for abdominal pain, vomiting and diarrhea as per history of present illness. Genitourinary: Negative for dysuria. Musculoskeletal: Negative for back pain. Skin: Negative for rash. Neurological: Negative for headache. 10 point Review of Systems otherwise negative ____________________________________________   PHYSICAL EXAM:  VITAL SIGNS: ED Triage Vitals  Enc Vitals Group     BP 11/11/15 0956 (!) 163/96     Pulse Rate 11/11/15  0956 (!) 56     Resp 11/11/15 0956 20     Temp --      Temp src --      SpO2 11/11/15 0956 100 %     Weight 11/11/15 0956 241 lb (109.3 kg)     Height 11/11/15 0956 6\' 2"  (1.88 m)     Head Circumference --      Peak Flow --      Pain Score 11/11/15 1003 10     Pain Loc --      Pain Edu? --      Excl. in [place]? --      Constitutional: Alert and oriented. Well appearing and in no distress. HEENT   Head: Normocephalic and atraumatic.      Eyes: Conjunctivae are normal. PERRL. Normal extraocular movements.      Ears:         Nose: No congestion/rhinnorhea.   Mouth/Throat: Mucous membranes are moist.   Neck: No stridor. Cardiovascular/Chest: Bradycardic irregularly irregular rhythm.  No murmurs, rubs, or gallops. Respiratory: Normal respiratory effort without tachypnea nor retractions. Breath sounds are clear and equal bilaterally. No wheezes/rales/rhonchi. Gastrointestinal: Soft. No distention, no guarding, no rebound. Moderate tenderness in the mid and lower abdomen..    Genitourinary/rectal:Deferred Musculoskeletal: Nontender with normal range of motion in all extremities. No joint effusions.  No lower extremity tenderness.  No edema. Neurologic:  Normal speech and language. No gross or focal neurologic deficits are appreciated. Skin:  Skin is warm, dry and intact. No rash noted. Psychiatric: Mood and affect are normal. Speech and behavior are normal. Patient exhibits appropriate insight and judgment.  ____________________________________________  EKG I, Lisa Roca, MD, the attending physician have personally viewed and interpreted all ECGs.  52 bpm. Atrial fibrillation with slow ventricular response. Left axis deviation. Intermittent paced rhythm ____________________________________________  LABS (pertinent positives/negatives)  Labs Reviewed  COMPREHENSIVE METABOLIC PANEL - Abnormal; Notable for the following:       Result Value   Glucose, Bld 267 (*)    BUN 27  (*)    Creatinine, Ser 1.54 (*)    Calcium 8.8 (*)    Total Bilirubin 1.6 (*)    GFR calc non Af Amer 45 (*)    GFR calc Af Amer 52 (*)    All other components within normal limits  CBC - Abnormal; Notable for the following:    WBC 10.7 (*)    RDW 17.2 (*)    All other components within normal limits  URINALYSIS COMPLETEWITH MICROSCOPIC ([facility] ONLY) - Abnormal; Notable for the following:    Color, Urine YELLOW (*)    APPearance CLEAR (*)    Glucose, UA 50 (*)    Ketones, ur TRACE (*)    Hgb urine dipstick 1+ (*)    Protein, ur >500 (*)    Bacteria, UA RARE (*)    All other components within normal limits  TROPONIN I - Abnormal; Notable for the following:    Troponin I 0.03 (*)    All other components within normal limits  GLUCOSE, CAPILLARY - Abnormal; Notable for the following:    Glucose-Capillary 246 (*)    All other components within normal limits  LIPASE, BLOOD  CBG MONITORING, ED    ____________________________________________  RADIOLOGY All Xrays were viewed by me. Imaging interpreted by Radiologist.  CT abdomen and pelvis with contrast:  I spoke with the reading radiologist who indicated there is a SMA thrombus and concern for ischemic bowel as well as small bowel obstruction  IMPRESSION: 1. There is a small bowel obstruction with the transition point in the left lower quadrant. The thick walled loops of proximal small bowel are likely due to the SMA thrombus resulting in ischemia within these loops. 2. Low-attenuation in the right hepatic lobe on delayed images 1 and 2 is nonspecific and not seen on portal venous phase imaging. Recommend comparison with older films if available. 3. Prominent nodes in the gastrohepatic ligament and porta hepatis are likely reactive. Findings, including SMA thrombus and likely ischemic small bowel loops, were called to Dr. Reita Cliche at 3:45 p.m. __________________________________________  PROCEDURES  Procedure(s) performed:  None  Critical Care performed: CRITICAL CARE Performed by: Lisa Roca   Total critical care time: 30 minutes  Critical care time was exclusive of separately billable procedures and treating other patients.  Critical care was necessary to treat or prevent imminent or life-threatening deterioration.  Critical care was time spent personally by me on the following activities: development of treatment plan with patient and/or surrogate as well as nursing, discussions with consultants, evaluation of patient's response to treatment, examination of patient, obtaining history from patient or surrogate, ordering and performing treatments and interventions, ordering and review of laboratory studies, ordering and review of radiographic studies, pulse oximetry and re-evaluation of patient's condition.   ____________________________________________   ED COURSE / ASSESSMENT AND PLAN  Pertinent labs & imaging results that were available during my care of the patient were reviewed by me and considered in my medical decision making (see chart for details).   In summary this patient's symptoms seem to be potentially viral gastritis try to [place] with vomiting  and diarrhea and abdominal cramping, however given the amount of his pain I did discuss with them obtaining CT for further evaluation for intra-abdominal source of moderate to severe pain.  Patient was given IV fluids as well as pain and nausea medication.   Addended to include I spoke with the radiologist that there is not only a small bowel obstruction but also thrombus in the SMA with thickened jejunum bowel loops concerning for ischemic colitis.  Patient started on heparin bolus and drip. Antibiotics were also initiated, patient reports allergy to penicillin and so Cipro metronidazole were chosen.  I consulted vascular surgery as well as general surgery.  I spoke with the OR nurse and Dr. Dahlia Byes will come down to the ED to evaluate the  patient.  I updated the family. It turns out he is supposed to be on Xarelto, but has been missing his doses.  Emergency Department care transferred to Dr. Dineen Kid at shift change 4p.m.  General surgery consultation is pending.   CONSULTATIONS:   Vascular surgery, Dr. Lucky Cowboy recommends heparin anticoagulation.  General surgery, Dr. Dahlia Byes.   Patient / Family / Caregiver informed of clinical course, medical decision-making process, and agree with plan.   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 Any transcriptional errors that result from this process are unintentional    Lisa Roca, MD 11/11/15 [place], MD 11/11/15 [phone]

## 2015-11-11 NOTE — ED Notes (Signed)
Critical lab reported to Reita Cliche, MD. Charge nurse informed. Preparing room family informed

## 2015-11-11 NOTE — H&P (Addendum)
Patient ID: Alex Vance, male   DOB: 1945-06-09, 70 y.o.   MRN: [id number]  HPI Morio Dukart is a 70 y.o. male multiple comorbidities including a fib on xarelto ( not compliant) CHF, ( seen [facility] has defibrillator no recent echos, assuming EF < 35% NYH II) DM. Presented with diffuse abdominal pain for the last 2 days associated with nausea and vomiting. The pain is in the lower abdomen is intermittent and aching in nature, improved with morphine received in the ER. No precipitating factors. He had some loose bowel movements but no hematochezia or melena. He is only able to walk with a walker for about 20 yards before developing dyspnea on exertion. Apparently was supposed to be answered all to but hasn't taken the doses in the last few days. Workup in the ER included a CT scan of the abdomen and pelvis that I have personally reviewed showing evidence of dilated loops of small bowel and also evidence of a thrombosed on the SMA. There is no evidence of pneumatosis and there is no evidence of free air. Changes are consistent with ischemic bowel from an embolic event  HPI  Past Medical History:  Diagnosis Date  . Diabetes mellitus without complication ([place])   . Hyperlipemia   . Hypertension     Past Surgical History:  Procedure Laterality Date  . CARDIAC DEFIBRILLATOR PLACEMENT      No family history on file.  Social History Social History  Substance Use Topics  . Smoking status: Former Research scientist (life sciences)  . Smokeless tobacco: Never Used  . Alcohol use No    Allergies  Allergen Reactions  . Penicillins Rash    Has patient had a PCN reaction causing immediate rash, facial/tongue/throat swelling, SOB or lightheadedness with hypotension: Yes Has patient had a PCN reaction causing severe rash involving mucus membranes or skin necrosis: No Has patient had a PCN reaction that required hospitalization No Has patient had a PCN reaction occurring within the last 10 years: No If all of the above  answers are "NO", then may proceed with Cephalosporin use.    Current Facility-Administered Medications  Medication Dose Route Frequency Provider Last Rate Last Dose  . 0.9 %  sodium chloride infusion   Intravenous Continuous Diego F Pabon, MD      . ciprofloxacin (CIPRO) IVPB 400 mg  400 mg Intravenous Once Lisa Roca, MD 200 mL/hr at 11/11/15 1615 400 mg at 11/11/15 1615  . ciprofloxacin (CIPRO) IVPB 400 mg  400 mg Intravenous Q12H Diego F Pabon, MD      . famotidine (PEPCID) IVPB 20 mg premix  20 mg Intravenous Q12H Diego F Pabon, MD      . heparin bolus via infusion 4,000 Units  4,000 Units Intravenous Once Sheema M Hallaji, RPH       Followed by  . heparin ADULT infusion 100 units/mL (25000 units/23mL sodium chloride 0.45%)  1,000 Units/hr Intravenous Continuous Sheema M Hallaji, RPH 10 mL/hr at 11/11/15 1624 1,000 Units/hr at 11/11/15 1624  . hydrALAZINE (APRESOLINE) injection 10 mg  10 mg Intravenous Q2H PRN Jules Husbands, MD      . Derrill Memo ON 11/12/2015] insulin aspart (novoLOG) injection 0-15 Units  0-15 Units Subcutaneous TID WC Diego F Pabon, MD      . metroNIDAZOLE (FLAGYL) IVPB 500 mg  500 mg Intravenous Once Lisa Roca, MD 100 mL/hr at 11/11/15 1610 500 mg at 11/11/15 1610  . metroNIDAZOLE (FLAGYL) IVPB 500 mg  500 mg Intravenous Q8H Diego Sarita Haver, MD      .  morphine 2 MG/ML injection 2 mg  2 mg Intravenous Q2H PRN Diego F Pabon, MD      . ondansetron (ZOFRAN-ODT) disintegrating tablet 4 mg  4 mg Oral Q6H PRN Diego F Pabon, MD       Or  . ondansetron (ZOFRAN) injection 4 mg  4 mg Intravenous Q6H PRN Diego F Pabon, MD      . sodium chloride 0.9 % bolus 1,000 mL  1,000 mL Intravenous Once Jules Husbands, MD       Current Outpatient Prescriptions  Medication Sig Dispense Refill  . carvedilol (COREG) 25 MG tablet Take 25 mg by mouth 2 (two) times daily.    . furosemide (LASIX) 40 MG tablet Take 40-80 mg by mouth See admin instructions. Take 2 tablets (80 mg) by mouth every  morning, and Take 1 tablet by mouth every night at bedtime.    . gabapentin (NEURONTIN) 800 MG tablet Take 800 mg by mouth daily as needed (for pain.).     Marland Kitchen LEVEMIR FLEXTOUCH 100 UNIT/ML Pen Inject 65 Units into the skin 2 (two) times daily.    Marland Kitchen losartan (COZAAR) 100 MG tablet Take 100 mg by mouth daily.    . metFORMIN (GLUCOPHAGE) 500 MG tablet Take 1,000 mg by mouth 2 (two) times daily with a meal.    . omeprazole (PRILOSEC) 20 MG capsule Take 20 mg by mouth daily.    . simvastatin (ZOCOR) 20 MG tablet Take 20 mg by mouth at bedtime.    Marland Kitchen spironolactone (ALDACTONE) 25 MG tablet Take 25 mg by mouth daily.    Marland Kitchen VICTOZA 18 MG/3ML SOPN Inject 1.8 mg into the skin daily.    Alveda Reasons 20 MG TABS tablet Take 20 mg by mouth at bedtime.       Review of Systems A 10 point review of systems was asked and was negative except for the information on the HPI  Physical Exam Blood pressure (!) 170/146, pulse 65, resp. rate (!) 23, height 6\' 2"  (1.88 m), weight 109.3 kg (241 lb), SpO2 98 %. CONSTITUTIONAL: NAD chronically ill EYES: Pupils are equal, round, and reactive to light, Sclera are non-icteric. EARS, NOSE, MOUTH AND THROAT: The oropharynx is clear. The oral mucosa is pink and moist. Hearing is intact to voice. LYMPH NODES:  Lymph nodes in the neck are normal. RESPIRATORY:  Lungs are clear. There is normal respiratory effort, with equal breath sounds bilaterally, and without pathologic use of accessory muscles. CARDIOVASCULAR: Heart is iregular without murmurs, gallops, or rubs. GI: The abdomen is soft,mild diffuse tenderness, no peritonitis GU: Rectal deferred.   MUSCULOSKELETAL: Normal muscle strength and tone. Pedal edema pitting upt o. knees  SKIN: Turgor is good and there are no pathologic skin lesions or ulcers. NEUROLOGIC: Motor and sensation is grossly normal. Cranial nerves are grossly intact. PSYCH:  Oriented to person, place and time. Affect is normal.  Data Reviewed  I have  personally reviewed the patient's imaging, laboratory findings and medical records.    Assessment Plan Giving bowel from SMA embolus likely from his A. fib on a debilitated patient with multiple comorbidities including heart failure, hypertension, diabetes and a fib. We will start with medical management with heparin drip, NG tube, nothing by mouth, IV antibiotics and gentle resuscitation given his heart failure. I have discussed with Dr. Lucky Cowboy from vascular surgery in detail about this case and he will taking to the [facility] for an angiogram and possible catheter directed TPA. Interestingly his INR is  therapeutic suggesting that he is anticoagulated.  No Need for emergent General  surgical intervention. If he does not improve with thrombolyzes and anticoagulation may need exploratory laparotomy with bowel resection. Does not seem to be the case at this time. We will also get echocardiogram and asked narcotics from internal medicine and cardiology to consult in this complex patient.  Caroleen Hamman, MD FACS General Surgeon 11/11/2015, 5:01 PM

## 2015-11-11 NOTE — Progress Notes (Signed)
Patient seen and examined for serial abdominal exam. Patient had B awoken from sleep for his exam. He is resting in bed comfortably without current complaints.  On exam the patient's abdomen is soft and nontender  Given his presenting symptoms and findings during vascular intervention Gen. surgery will continue performing serial abdominal exams throughout the night.  Clayburn Pert, MD FACS General Surgeon [facility]

## 2015-11-11 NOTE — ED Notes (Signed)
Pts family upset about pts BP, report he did not take his medication today. EDP made aware. pts family states that they will give him his home medications that they brought with them

## 2015-11-11 NOTE — Progress Notes (Addendum)
ANTICOAGULATION CONSULT NOTE - Initial Consult  Pharmacy Consult for heparin drip Indication: Superior Mesenteric Artery Thrombosis   Allergies  Allergen Reactions  . Penicillins Rash    Patient Measurements: Height: 6\' 2"  (188 cm) Weight: 241 lb (109.3 kg) IBW/kg (Calculated) : 82.2 Heparin Dosing Weight: 104 kg  Vital Signs: BP: 170/146 (07/23 1500) Pulse Rate: 65 (07/23 1500)  Labs:  Recent Labs  11/11/15 1020  HGB 13.6  HCT 41.2  PLT 175  CREATININE 1.54*  TROPONINI 0.03*    Estimated Creatinine Clearance: 61.2 mL/min (by C-G formula based on SCr of 1.54 mg/dL).   Medical History: Past Medical History:  Diagnosis Date  . Diabetes mellitus without complication ([place])   . Hyperlipemia   . 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 Pharmacy consulted for heparin drip management.     Goal of Therapy:  Heparin level 0.3-0.7 units/ml Monitor platelets by anticoagulation protocol: Yes   Plan:  Give 4000 units bolus x 1 Start heparin infusion at 1000 units/hr Check anti-Xa level in 6 hours and daily while on heparin Continue to monitor H&H and platelets   After labs resulted it was discovered that patient was on rivaroxaban 20mg  daily at home.  Ordered baseline heparin level.  Will need to use HL and aPTT to titrate heparin.   Nancy Fetter, PharmD Clinical Pharmacist 11/11/2015 4:25 PM

## 2015-11-11 NOTE — Consult Note (Signed)
Full note to follow Acute mesenteric ischemia, likely embolic Will attempt angiogram with possible revascularization today

## 2015-11-11 NOTE — Progress Notes (Signed)
To radiology special procedures for angiogram and possible revascularization of SMA by Dr Lucky Cowboy. Consent on chart. Heparin at 1000u/hr. Report given to vascular lab staff and Dr Lucky Cowboy. Afib on monitor. VSS. sats good on room air.

## 2015-11-11 NOTE — ED Notes (Signed)
Report called to CCU pam

## 2015-11-12 ENCOUNTER — Encounter: Payer: Self-pay | Admitting: Student

## 2015-11-12 ENCOUNTER — Inpatient Hospital Stay: Payer: Medicare Other

## 2015-11-12 ENCOUNTER — Inpatient Hospital Stay
Admit: 2015-11-12 | Discharge: 2015-11-12 | Disposition: A | Discharge status: Home / Self Care | Payer: Medicare Other | Attending: Vascular Surgery | Admitting: Vascular Surgery

## 2015-11-12 DIAGNOSIS — I5022 Chronic systolic (congestive) heart failure: Secondary | ICD-10-CM

## 2015-11-12 DIAGNOSIS — K559 Vascular disorder of intestine, unspecified: Secondary | ICD-10-CM

## 2015-11-12 DIAGNOSIS — I482 Chronic atrial fibrillation: Secondary | ICD-10-CM

## 2015-11-12 LAB — ECHOCARDIOGRAM COMPLETE
HEIGHTINCHES: 74 in
Weight: 3947.12 oz

## 2015-11-12 LAB — BASIC METABOLIC PANEL
Anion gap: 8 (ref 5–15)
BUN: 31 mg/dL — ABNORMAL HIGH (ref 6–20)
CALCIUM: 7.8 mg/dL — AB (ref 8.9–10.3)
CHLORIDE: 108 mmol/L (ref 101–111)
CO2: 22 mmol/L (ref 22–32)
CREATININE: 1.48 mg/dL — AB (ref 0.61–1.24)
GFR calc non Af Amer: 47 mL/min — ABNORMAL LOW (ref >60)
GFR, EST AFRICAN AMERICAN: 55 mL/min — AB (ref >60)
GLUCOSE: 293 mg/dL — AB (ref 65–99)
Potassium: 3.8 mmol/L (ref 3.5–5.1)
Sodium: 138 mmol/L (ref 135–145)

## 2015-11-12 LAB — CBC
HCT: 37.2 % — ABNORMAL LOW (ref 40.0–52.0)
HEMOGLOBIN: 12.5 g/dL — AB (ref 13.0–18.0)
MCH: 28 pg (ref 26.0–34.0)
MCHC: 33.6 g/dL (ref 32.0–36.0)
MCV: 83.3 fL (ref 80.0–100.0)
PLATELETS: 160 10*3/uL (ref 150–440)
RBC: 4.47 MIL/uL (ref 4.40–5.90)
RDW: 17.4 % — ABNORMAL HIGH (ref 11.5–14.5)
WBC: 15.6 10*3/uL — ABNORMAL HIGH (ref 3.8–10.6)

## 2015-11-12 LAB — GLUCOSE, CAPILLARY
GLUCOSE-CAPILLARY: 295 mg/dL — AB (ref 65–99)
GLUCOSE-CAPILLARY: 205 mg/dL — AB (ref 65–99)
Glucose-Capillary: 164 mg/dL — ABNORMAL HIGH (ref 65–99)
Glucose-Capillary: 176 mg/dL — ABNORMAL HIGH (ref 65–99)
Glucose-Capillary: 282 mg/dL — ABNORMAL HIGH (ref 65–99)

## 2015-11-12 LAB — LACTIC ACID, PLASMA
LACTIC ACID, VENOUS: 2.8 mmol/L — AB (ref 0.5–1.9)
Lactic Acid, Venous: 3.5 mmol/L (ref 0.5–1.9)
Lactic Acid, Venous: 2.8 mmol/L (ref 0.5–1.9)

## 2015-11-12 LAB — APTT
APTT: 43 s — AB (ref 24–36)
APTT: 48 s — AB (ref 24–36)
APTT: 57 s — AB (ref 24–36)
aPTT: 49 seconds — ABNORMAL HIGH (ref 24–36)

## 2015-11-12 LAB — HEMOGLOBIN A1C: Hgb A1c MFr Bld: 9.1 % — ABNORMAL HIGH (ref 4.0–6.0)

## 2015-11-12 LAB — HEPARIN LEVEL (UNFRACTIONATED): HEPARIN UNFRACTIONATED: 0.97 [IU]/mL — AB (ref 0.30–0.70)

## 2015-11-12 MED ORDER — INSULIN DETEMIR 100 UNIT/ML ~~LOC~~ SOLN
8.0000 [IU] | Freq: Two times a day (BID) | SUBCUTANEOUS | Status: DC
  Administered 2015-11-12 (×2): 8 [IU] via SUBCUTANEOUS
  Filled 2015-11-12 (×5): qty 0.08

## 2015-11-12 MED ORDER — INSULIN ASPART 100 UNIT/ML ~~LOC~~ SOLN
0.0000 [IU] | SUBCUTANEOUS | Status: DC
  Administered 2015-11-12: 8 [IU] via SUBCUTANEOUS
  Administered 2015-11-12: 3 [IU] via SUBCUTANEOUS
  Administered 2015-11-12: 5 [IU] via SUBCUTANEOUS
  Administered 2015-11-12: 8 [IU] via SUBCUTANEOUS
  Administered 2015-11-13 (×4): 3 [IU] via SUBCUTANEOUS
  Administered 2015-11-13: 5 [IU] via SUBCUTANEOUS
  Filled 2015-11-12: qty 5
  Filled 2015-11-12 (×3): qty 3
  Filled 2015-11-12: qty 8
  Filled 2015-11-12: qty 4
  Filled 2015-11-12: qty 5
  Filled 2015-11-12: qty 8
  Filled 2015-11-12: qty 3

## 2015-11-12 MED ORDER — SODIUM CHLORIDE 0.9 % IV BOLUS (SEPSIS)
1000.0000 mL | Freq: Once | INTRAVENOUS | Status: AC
  Administered 2015-11-12: 1000 mL via INTRAVENOUS

## 2015-11-12 NOTE — Progress Notes (Signed)
*  PRELIMINARY RESULTS* Echocardiogram 2D Echocardiogram has been performed.  Sherrie Sport 11/12/2015, 2:35 PM

## 2015-11-12 NOTE — Progress Notes (Signed)
[facility]  Daily Progress Note   Subjective  - 1 Day Post-Op  No pain this morning.  Feeling much better.  No N/V.  NG tube out  Objective Vitals:   11/12/15 0000 11/12/15 0100 11/12/15 0500 11/12/15 0759  BP:  139/90 128/83   Pulse:  90 68   Resp: 20 (!) 21 17   Temp: 98.8 F (37.1 C)  98.2 F (36.8 C)   TempSrc: Oral  Oral   SpO2:   94% 94%  Weight:      Height:        Intake/Output Summary (Last 24 hours) at 11/12/15 0851 Last data filed at 11/12/15 0600  Gross per 24 hour  Intake             1660 ml  Output             1800 ml  Net             -140 ml    PULM  CTAB CV  RRR VASC  Access site C/D/I ABD    Soft, ND/NT  Laboratory CBC    Component Value Date/Time   WBC 15.6 (H) 11/12/2015 0414   HGB 12.5 (L) 11/12/2015 0414   HCT 37.2 (L) 11/12/2015 0414   PLT 160 11/12/2015 0414    BMET    Component Value Date/Time   NA 138 11/12/2015 0414   K 3.8 11/12/2015 0414   CL 108 11/12/2015 0414   CO2 22 11/12/2015 0414   GLUCOSE 293 (H) 11/12/2015 0414   BUN 31 (H) 11/12/2015 0414   CREATININE 1.48 (H) 11/12/2015 0414   CALCIUM 7.8 (L) 11/12/2015 0414   GFRNONAA 47 (L) 11/12/2015 0414   GFRAA 55 (L) 11/12/2015 0414    Assessment/Planning: POD #1 s/p SMA intervention for embolic occlusion   Doing well this am  Denies pain  Abdomen soft  Looks much better this am.  Hopefully will not need any surgery/resection  Appreciate General Surgery evaluation as well.  Will defer decision on whether or not to start clears to General Surgery service.  No other recs from vascular POV at this point other than continued heparin drip    Rushton Early  11/12/2015, 8:51 AM

## 2015-11-12 NOTE — Progress Notes (Signed)
[facility] NAME: Alex Vance    MR#:  [id number]  DATE OF BIRTH:  [birth year]  SUBJECTIVE:  Came in with abdominal pain found to have ischemic colon S/p angiogram/plasty of SMA due to occlusion Doing well. In the chair. No fever or abd pain REVIEW OF SYSTEMS:   Review of Systems  Constitutional: Negative for chills, fever and weight loss.  HENT: Negative for ear discharge, ear pain and nosebleeds.   Eyes: Negative for blurred vision, pain and discharge.  Respiratory: Negative for sputum production, shortness of breath, wheezing and stridor.   Cardiovascular: Negative for chest pain, palpitations, orthopnea and PND.  Gastrointestinal: Negative for abdominal pain, diarrhea, nausea and vomiting.  Genitourinary: Negative for frequency and urgency.  Musculoskeletal: Negative for back pain and joint pain.  Neurological: Positive for weakness. Negative for sensory change, speech change and focal weakness.  Psychiatric/Behavioral: Negative for depression and hallucinations. The patient is not nervous/anxious.    Tolerating Diet:npo Tolerating [id number]   DRUG ALLERGIES:   Allergies  Allergen Reactions  . Penicillins Rash    Has patient had a PCN reaction causing immediate rash, facial/tongue/throat swelling, SOB or lightheadedness with hypotension: Yes Has patient had a PCN reaction causing severe rash involving mucus membranes or skin necrosis: No Has patient had a PCN reaction that required hospitalization No Has patient had a PCN reaction occurring within the last 10 years: No If all of the above answers are "NO", then may proceed with Cephalosporin use.    VITALS:  Blood pressure 108/65, pulse 70, temperature 97.7 F (36.5 C), temperature source Oral, resp. rate 16, height 6\' 2"  (1.88 m), weight 111.9 kg (246 lb 11.1 oz), SpO2 95 %.  PHYSICAL EXAMINATION:   Physical Exam  GENERAL:  70 y.o.-year-old patient  lying in the bed with no acute distress.  EYES: Pupils equal, round, reactive to light and accommodation. No scleral icterus. Extraocular muscles intact.  HEENT: Head atraumatic, normocephalic. Oropharynx and nasopharynx clear.  NECK:  Supple, no jugular venous distention. No thyroid enlargement, no tenderness.  LUNGS: Normal breath sounds bilaterally, no wheezing, rales, rhonchi. No use of accessory muscles of respiration.  CARDIOVASCULAR: S1, S2 normal. No murmurs, rubs, or gallops.  ABDOMEN: Soft, nontender, nondistended. Bowel sounds present. No organomegaly or mass.  EXTREMITIES: No cyanosis, clubbing or edema b/l.    NEUROLOGIC: Cranial nerves II through XII are intact. No focal Motor or sensory deficits b/l.   PSYCHIATRIC:  patient is alert and oriented x 3.  SKIN: No obvious rash, lesion, or ulcer.   LABORATORY PANEL:  CBC  Recent Labs Lab 11/12/15 0414  WBC 15.6*  HGB 12.5*  HCT 37.2*  PLT 160    Chemistries   Recent Labs Lab 11/11/15 1020 11/12/15 0414  NA 135 138  K 4.0 3.8  CL 104 108  CO2 24 22  GLUCOSE 267* 293*  BUN 27* 31*  CREATININE 1.54* 1.48*  CALCIUM 8.8* 7.8*  AST 26  --   ALT 23  --   ALKPHOS 92  --   BILITOT 1.6*  --    Cardiac Enzymes  Recent Labs Lab 11/11/15 1020  TROPONINI 0.03*   RADIOLOGY:  Dg Chest 2 View  Result Date: 11/11/2015 CLINICAL DATA:  70 year old male with CHF. EXAM: CHEST  2 VIEW COMPARISON:  01/05/2006 chest radiograph FINDINGS: Cardiomegaly and mild pulmonary vascular congestion noted. A left pacemaker/ ICD noted with tips overlying the right atrium and right ventricle.  An NG tube is present with tip difficult to visualize. Mild bibasilar atelectasis noted. There is no evidence of pneumothorax or definite pleural effusion. IMPRESSION: Cardiomegaly with mild pulmonary vascular congestion and mild bibasilar atelectasis. Electronically Signed   By: Margarette Canada M.D.   On: 11/11/2015 17:46  Ct Abdomen Pelvis W  Contrast  Result Date: 11/11/2015 CLINICAL DATA:  Lower abdominal pain for 2 days. EXAM: CT ABDOMEN AND PELVIS WITH CONTRAST TECHNIQUE: Multidetector CT imaging of the abdomen and pelvis was performed using the standard protocol following bolus administration of intravenous contrast. CONTRAST:  146mL ISOVUE-300 IOPAMIDOL (ISOVUE-300) INJECTION 61% COMPARISON:  None. FINDINGS: Mild atelectasis in the lung bases. No other acute abnormalities seen within the lung bases. There is a fat containing umbilical hernia. A small amount of free fluid is seen within the pelvis. The stomach is mildly distended. The duodenum is normal in appearance. There is dilatation of small bowel from the proximal jejunum to the distal jejunum or proximal ileum. The most proximal dilated loops of small bowel such as on series 2, image 38 are also thick walled and edematous in appearance. The thick walled small bowel covers a long segment. Beyond the transition point, best seen on coronal image 32, the small bowel is decompressed. Colonic diverticulosis is seen without diverticulitis. The appendix is normal in appearance. Atherosclerosis is seen in the non aneurysmal aorta. There is hepatic steatosis. The gallbladder, portal vein, and adrenal glands are normal. No acute abnormalities seen in the spleen. The pancreas is normal in appearance as well. The small cysts are seen in the kidneys with no hydronephrosis, stones, or suspicious masses. No adenopathy identified in the abdomen. The abdominal aorta is atherosclerotic. There is an abrupt cut off of the SMA as seen on axial image 44 and coronal image 65 with re- cannulization more distally. This is consistent with thrombus in the SMA, likely resulting in the thickened small bowel loops. The thrombus covers a distance of 4.7 cm. The pelvis demonstrates a small amount of free fluid. No adenopathy or mass. The bladder, prostate, and seminal vesicles are normal. The visualized bones are normal.  IMPRESSION: 1. There is a small bowel obstruction with the transition point in the left lower quadrant. The thick walled loops of proximal small bowel are likely due to the SMA thrombus resulting in ischemia within these loops. 2. Low-attenuation in the right hepatic lobe on delayed images 1 and 2 is nonspecific and not seen on portal venous phase imaging. Recommend comparison with older films if available. 3. Prominent nodes in the gastrohepatic ligament and porta hepatis are likely reactive. Findings, including SMA thrombus and likely ischemic small bowel loops, were called to Dr. Reita Cliche at 3:45 p.m. Electronically Signed   By: Dorise Bullion III M.D   On: 11/11/2015 15:52  Dg Abd Acute W/chest  Result Date: 11/12/2015 CLINICAL DATA:  Mesenteric ischemia. EXAM: DG ABDOMEN ACUTE W/ 1V CHEST COMPARISON:  11/11/2015.  CT 11/11/2015. FINDINGS: Cardiac pacer with lead tips in right atrium right ventricle. Cardiomegaly with normal pulmonary vascularity. Low lung volumes with mild bibasilar atelectasis. Mild infiltrate right lung base cannot be excluded. Multiple dilated loops of small bowel noted. These findings are worrisome for small bowel obstruction. No free air. Vascular calcifications are noted. IMPRESSION: 1.  Mild right base atelectasis and/or infiltrate. 2. Dilated loops of small bowel worrisome for small bowel obstruction. 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  Dilated cardiomyopathy ([place]) -stable -cont cardiac meds  3.  Type II diabetes mellitus with complication (HCC) -cont home meds and started on levemir 8 units bid  4.  Chronic systolic CHF (congestive heart failure) (HCC) -stable. Pt not in HF  5.  HTN -stable  6  Chronic atrial fibrillation (HCC) -will resume xarelto once heparin is  d/ced Case discussed with Care Management/Social Worker. Management plans discussed with the patient, family and they are in agreement.  CODE STATUS:full  DVT Prophylaxis: heparin gtt  TOTAL TIME TAKING CARE OF THIS PATIENT: 30* minutes.  >50% time spent on counselling and coordination of care pt and wife  POSSIBLE D/C IN 1-2DAYS, DEPENDING ON CLINICAL CONDITION.  Note: This dictation was prepared with Dragon dictation along with smaller phrase technology. Any transcriptional errors that result from this process are unintentional.  Ronisha Herringshaw M.D on 11/12/2015 at 2:00 PM  Between 7am to 6pm - Pager - [phone]  After 6pm go to [email or web address] - password [email or web address] [facility]  [facility]  Office  [phone]  CC: Primary care physician; No primary care provider on file.

## 2015-11-12 NOTE — Progress Notes (Signed)
70 yr old male POD#1 from SMA intervention for embolic occlusion.  Patient states that he is feeling much better.  He denies any abdominal pain since procedure performed.  He is up and sitting in chair, very comforatble.  He denies any flatus or BM but also has done well with NG tube discontinued.    Vitals:   11/12/15 1500 11/12/15 1600  BP: 104/71 104/61  Pulse: 82 71  Resp: 16 19  Temp:     I/O last 3 completed shifts: In: 82 [I.V.:2621; Other:450; IV Piggyback:1000] Out: 1800 [Urine:1800] Total I/O In: 400 [IV Piggyback:400] Out: 400 [Urine:400]   PE:  Gen: NAD Res: CTAb/L  Cardio: RRR Abd: soft, non-tender, non-distended   A/P:  Patient continuing to improve, benign exam now and throughout the day.  Lactic acid trended and coming down from 3.5 to 2.8, more saline given today.  Will continue to trend.  Given that this and the creatine are continuing to improve, along with benign exam no emergent need for surgical intervention at this time.  At present OR is down and if he were to change and need intervention before noon tomorrow he would have to be transferred to another facility.  This was discussed with the patient and family and they are happy that his labs are slowly improving and understand the need for transfer if he were to worsen.    DM with hyperglycemia: appreciate IM help, lantus started today and q 4 checks

## 2015-11-12 NOTE — Progress Notes (Signed)
Critical Lactic of 2.8 called to Dr. Marko Plume in general surgery. Per Dr Ronnell Guadal[place]ara will continue to trend lactic value again in 3 hrs to ensure it is trending down. Per Dr Marko Plume she will update family at bedside shortly.

## 2015-11-12 NOTE — Progress Notes (Signed)
  Serial Exam Performed  Patient inadvertently pulled NG Tube. He denies any Nausea at this time. Had minimal output prior to pulling tube.  Abdomen is benign and non-tender at this time.  Urine in Foley is sanguinous  Discussed with nurse that it is ok to leave NGT out for now. If patient has any nausea then they are to replace the NGT. Will continue to follow closely  Clayburn Pert, MD [facility]

## 2015-11-12 NOTE — Care Management (Signed)
Patient sitting in bedside chair currently sleeping. He is on CPAP. Patient is on Chronic Xarelto however has missed some doses per wife due to forgetting to take. RNCM should continue to follow for new treatment medications. Patient is diabetic also.

## 2015-11-12 NOTE — Progress Notes (Signed)
ANTICOAGULATION CONSULT NOTE - Initial Consult  Pharmacy Consult for heparin drip Indication: Superior Mesenteric Artery Thrombosis   Allergies  Allergen Reactions  . Penicillins Rash    Has patient had a PCN reaction causing immediate rash, facial/tongue/throat swelling, SOB or lightheadedness with hypotension: Yes Has patient had a PCN reaction causing severe rash involving mucus membranes or skin necrosis: No Has patient had a PCN reaction that required hospitalization No Has patient had a PCN reaction occurring within the last 10 years: No If all of the above answers are "NO", then may proceed with Cephalosporin use.    Patient Measurements: Height: 6\' 2"  (188 cm) Weight: 246 lb 11.1 oz (111.9 kg) IBW/kg (Calculated) : 82.2 Heparin Dosing Weight: 104 kg  Vital Signs: Temp: 98 F (36.7 C) (07/24 1400) Temp Source: Oral (07/24 1400) BP: 108/65 (07/24 1100) Pulse Rate: 70 (07/24 1200)  Labs:  Recent Labs  11/11/15 1020 11/12/15 0009 11/12/15 0414 11/12/15 0800 11/12/15 1428  HGB 13.6  --  12.5*  --   --   HCT 41.2  --  37.2*  --   --   PLT 175  --  160  --   --   APTT 34 43*  --  49* 57*  LABPROT 24.9*  --   --   --   --   INR 2.28  --   --   --   --   HEPARINUNFRC 2.00* 0.97*  --   --   --   CREATININE 1.54*  --  1.48*  --   --   TROPONINI 0.03*  --   --   --   --     Estimated Creatinine Clearance: 64.5 mL/min (by C-G formula based on SCr of 1.48 mg/dL).   Medical History: Past Medical History:  Diagnosis Date  . Chronic atrial fibrillation (HCC)    a. on Xarelto  . Diabetes mellitus without complication ([place])   . Hyperlipemia   . Hypertension   . Nonischemic cardiomyopathy [facility])    a. s/p [employer] ICD placement 2008, generator change 11/2013  b. 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 Patient has a h/o afib on Xarelto with noncompliance.  Pharmacy consulted for heparin drip management.     Goal of Therapy:  APTT 68-109 Heparin level 0.3-0.7 units/ml Monitor platelets by anticoagulation protocol: Yes   Plan:  APTT 57. Will increase heparin infusion to 1300 units/hr and recheck aPTT in 6 hours.   Ulice Dash, PharmD Clinical Pharmacist  11/12/2015 3:25 PM

## 2015-11-12 NOTE — Progress Notes (Signed)
ANTICOAGULATION CONSULT NOTE - Initial Consult  Pharmacy Consult for heparin drip Indication: Superior Mesenteric Artery Thrombosis   Allergies  Allergen Reactions  . Penicillins Rash    Has patient had a PCN reaction causing immediate rash, facial/tongue/throat swelling, SOB or lightheadedness with hypotension: Yes Has patient had a PCN reaction causing severe rash involving mucus membranes or skin necrosis: No Has patient had a PCN reaction that required hospitalization No Has patient had a PCN reaction occurring within the last 10 years: No If all of the above answers are "NO", then may proceed with Cephalosporin use.    Patient Measurements: Height: 6\' 2"  (188 cm) Weight: 246 lb 11.1 oz (111.9 kg) IBW/kg (Calculated) : 82.2 Heparin Dosing Weight: 104 kg  Vital Signs: Temp: 97.7 F (36.5 C) (07/24 0800) Temp Source: Oral (07/24 0800) BP: 100/60 (07/24 1000) Pulse Rate: 76 (07/24 1000)  Labs:  Recent Labs  11/11/15 1020 11/12/15 0009 11/12/15 0414 11/12/15 0800  HGB 13.6  --  12.5*  --   HCT 41.2  --  37.2*  --   PLT 175  --  160  --   APTT 34 43*  --  49*  LABPROT 24.9*  --   --   --   INR 2.28  --   --   --   HEPARINUNFRC 2.00* 0.97*  --   --   CREATININE 1.54*  --  1.48*  --   TROPONINI 0.03*  --   --   --     Estimated Creatinine Clearance: 64.5 mL/min (by C-G formula based on SCr of 1.48 mg/dL).   Medical History: Past Medical History:  Diagnosis Date  . Chronic atrial fibrillation (HCC)    a. on Xarelto  . Diabetes mellitus without complication ([place])   . Hyperlipemia   . Hypertension   . Nonischemic cardiomyopathy [facility])    a. s/p [employer] ICD placement 2008, generator change 11/2013  b. 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 Patient has a h/o afib on Xarelto with noncompliance. Pharmacy consulted for heparin drip management.     Goal of  Therapy:  APTT 68-109 Heparin level 0.3-0.7 units/ml Monitor platelets by anticoagulation protocol: Yes   Plan:  APTT 49. Will increase heparin infusion to 1200 units/hr and recheck aPTT in 6 hours.   Ulice Dash, PharmD Clinical Pharmacist  11/12/2015 11:51 AM

## 2015-11-12 NOTE — Progress Notes (Signed)
Inpatient Diabetes Program Recommendations  AACE/ADA: New Consensus Statement on Inpatient Glycemic Control (2015)  Target Ranges:  Prepandial:   less than 140 mg/dL      Peak postprandial:   less than 180 mg/dL (1-2 hours)      Critically ill patients:  140 - 180 mg/dL   Results for LADD, GLAWSON (MRN [id number]) as of 11/12/2015 09:02  Ref. Range 11/11/2015 10:22 11/11/2015 17:43 11/11/2015 21:46 11/12/2015 07:40  Glucose-Capillary Latest Ref Range: 65 - 99 mg/dL 246 (H) 234 (H) 242 (H) 295 (H)   Review of Glycemic Control  Diabetes history: DM2 Outpatient Diabetes medications: Levemir 65 units BID, Metformin 1000 mg BID, Victoza 1.8 mg daily Current orders for Inpatient glycemic control: Novolog 0-15 units Q4H  Inpatient Diabetes Program Recommendations:  Insulin - Basal: Please consider ordering Levemir 8 units BID (based on 111 kg x 0.15 units). HgbA1C: Last A1C by PCP 10.9% on 07/19/15. Current A1C in process.  Thanks, Barnie Alderman, RN, MSN, CDE Diabetes Coordinator Inpatient Diabetes Program [phone] (Team Pager from [place] to [place]) [phone] ([place] office) [phone] [facility] office) [phone] [facility] office)

## 2015-11-12 NOTE — Progress Notes (Signed)
ANTICOAGULATION CONSULT NOTE - Initial Consult  Pharmacy Consult for heparin drip Indication: Superior Mesenteric Artery Thrombosis   Allergies  Allergen Reactions  . Penicillins Rash    Has patient had a PCN reaction causing immediate rash, facial/tongue/throat swelling, SOB or lightheadedness with hypotension: Yes Has patient had a PCN reaction causing severe rash involving mucus membranes or skin necrosis: No Has patient had a PCN reaction that required hospitalization No Has patient had a PCN reaction occurring within the last 10 years: No If all of the above answers are "NO", then may proceed with Cephalosporin use.    Patient Measurements: Height: 6\' 2"  (188 cm) Weight: 246 lb 11.1 oz (111.9 kg) IBW/kg (Calculated) : 82.2 Heparin Dosing Weight: 104 kg  Vital Signs: Temp: 98.8 F (37.1 C) (07/24 0000) Temp Source: Oral (07/24 0000) BP: 139/90 (07/24 0100) Pulse Rate: 90 (07/24 0100)  Labs:  Recent Labs  11/11/15 1020 11/12/15 0009  HGB 13.6  --   HCT 41.2  --   PLT 175  --   APTT 34 43*  LABPROT 24.9*  --   INR 2.28  --   HEPARINUNFRC 2.00* 0.97*  CREATININE 1.54*  --   TROPONINI 0.03*  --     Estimated Creatinine Clearance: 62 mL/min (by C-G formula based on SCr of 1.54 mg/dL).   Medical History: Past Medical History:  Diagnosis Date  . Diabetes mellitus without complication ([place])   . Hyperlipemia   . 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 Pharmacy consulted for heparin drip management.     Goal of Therapy:  Heparin level 0.3-0.7 units/ml Monitor platelets by anticoagulation protocol: Yes   Plan:  HL 0.97, aPTT 43. Patient takes rivaroxaban at home, so will dose off aPTT until levels correlate. Increase to 1100 units/hr, will recheck aPTT in 6 hours.  Tyreke Kaeser A. [place], [place].D., BCPS Clinical Pharmacist 11/12/2015 1:59 AM

## 2015-11-12 NOTE — Consult Note (Signed)
Cardiology Consult    Patient ID: Alex Vance MRN: [id number], DOB/AGE: March 16, 1946   Admit date: 11/11/2015 Date of Consult: 11/12/2015  Primary Physician: No primary care provider on file. Reason for Consult: Cardiomyopathy, Atrial Fibrillation Primary Cardiologist: [facility] - Dr. Lehman Prom Requesting Provider: Dr. Dahlia Byes   History of Present Illness    Alex Vance is a 70 y.o. male with past medical history of nonischemic cardiomyopathy (s/p [employer] ICD implant in 2008, recent generator change in [id number]), chronic systolic CHF (EF 99991111 by echo in 2015), chronic atrial fibrillation (on Xarelto), Type 2 DM, HTN, and HLD who presented to [facility] on 11/11/2015 for evaluation of abdominal pain.  Patient reports his symptoms started earlier that morning with the abdominal pain presenting first, then subsequent nausea, vomiting, and diarrhea. Denies any melena, hematochezia, or hematemesis.  He reports having chronic atrial fibrillation for many years and was switched from Coumadin to Xarelto over two years ago. Over the past few weeks, he has missed several doses of his Xarelto. Says he just forgets to take the medication at night.   From a CHF standpoint, he denies any recent orthopnea or PND. Reports having chronic dyspnea with exertion but denies any acute worsening of his symptoms. No chest discomfort or palpitations. Does have lower extremity edema but takes daily Furosemide.   Labs upon admission showed a WBC of 10.7, Hgb 13.6, platelets 175. Na+ 135, K+ 4.0, creatinine 1.54. Lipase 25. Initial troponin 0.03. EKG showing atrial fibrillation, HR 52 with V-paced complexes. Abdominal CT showed a small bowel obstruction with the transition point in the left lower quadrant, with the thick walled loops of proximal small bowel likely due to the SMA thrombus resulting in ischemia within these loops.   He was started on Heparin and General Surgery along with Vascular Surgery were  consulted. He was taken for angiography with catheter directed thrombolysis and mechanical thrombectomy to the SMA on 7/23.   This morning, he reports his abdominal pain has resolved along with his nausea and vomiting. Telemetry shows rate-controlled atrial fibrillation.   Past Medical History   Past Medical History:  Diagnosis Date  . Diabetes mellitus without complication ([place])   . Hyperlipemia   . Hypertension     Past Surgical History:  Procedure Laterality Date  . CARDIAC DEFIBRILLATOR PLACEMENT       Allergies  Allergies  Allergen Reactions  . Penicillins Rash    Has patient had a PCN reaction causing immediate rash, facial/tongue/throat swelling, SOB or lightheadedness with hypotension: Yes Has patient had a PCN reaction causing severe rash involving mucus membranes or skin necrosis: No Has patient had a PCN reaction that required hospitalization No Has patient had a PCN reaction occurring within the last 10 years: No If all of the above answers are "NO", then may proceed with Cephalosporin use.    Inpatient Medications    . ciprofloxacin  400 mg Intravenous Q12H  . famotidine (PEPCID) IV  20 mg Intravenous Q12H  . heparin  4,000 Units Intravenous Once  . insulin aspart  0-15 Units Subcutaneous Q4H  . ipratropium-albuterol  3 mL Nebulization QID  . metronidazole  500 mg Intravenous Q8H  . sodium chloride  1,000 mL Intravenous Once    Family History    Family History  Problem Relation Age of Onset  . Diabetes Father     Social History    Social History   Social History  . Marital status: Single    Spouse name: N/A  .  Number of children: N/A  . Years of education: N/A   Occupational History  . Not on file.   Social History Main Topics  . Smoking status: Former Research scientist (life sciences)  . Smokeless tobacco: Never Used  . Alcohol use No  . Drug use: No  . Sexual activity: Not on file   Other Topics Concern  . Not on file   Social History Narrative  . No  narrative on file     Review of Systems    General:  No chills, fever, night sweats or weight changes.  Cardiovascular:  No chest pain, dyspnea on exertion, orthopnea, palpitations, paroxysmal nocturnal dyspnea. Positive for lower extremity edema.  Dermatological: No rash, lesions/masses Respiratory: No cough, dyspnea Urologic: No hematuria, dysuria Abdominal:   No bright red blood per rectum, melena, or hematemesis. Positive for abdominal pain, nausea, vomiting, and diarrhea. Neurologic:  No visual changes, wkns, changes in mental status. All other systems reviewed and are otherwise negative except as noted above.  Physical Exam    Blood pressure 128/83, pulse 68, temperature 98.2 F (36.8 C), temperature source Oral, resp. rate 17, height 6\' 2"  (1.88 m), weight 246 lb 11.1 oz (111.9 kg), SpO2 94 %.  General: Pleasant, overweight African American male appearing in NAD. Psych: Normal affect. Neuro: Alert and oriented X 3. Moves all extremities spontaneously. HEENT: Normal  Neck: Supple without bruits or JVD. Lungs:  Resp regular and unlabored, CTA without wheezing or rales. Heart: Irregularly irregular, no s3, s4, or murmurs. Abdomen: Soft, non-tender, non-distended, BS + x 4.  Extremities: No clubbing or cyanosis. Trace lower extremity edema bilaterally. DP/PT/Radials 1+ and equal bilaterally.  Labs    Troponin (Point of Care Test) No results for input(s): TROPIPOC in the last 72 hours.  Recent Labs  11/11/15 1020  TROPONINI 0.03*   Lab Results  Component Value Date   WBC 15.6 (H) 11/12/2015   HGB 12.5 (L) 11/12/2015   HCT 37.2 (L) 11/12/2015   MCV 83.3 11/12/2015   PLT 160 11/12/2015     Recent Labs Lab 11/11/15 1020 11/12/15 0414  NA 135 138  K 4.0 3.8  CL 104 108  CO2 24 22  BUN 27* 31*  CREATININE 1.54* 1.48*  CALCIUM 8.8* 7.8*  PROT 7.9  --   BILITOT 1.6*  --   ALKPHOS 92  --   ALT 23  --   AST 26  --   GLUCOSE 267* 293*   No results found for:  CHOL, HDL, LDLCALC, TRIG No results found for: [facility]   Radiology Studies    Dg Chest 2 View: Result Date: 11/11/2015 CLINICAL DATA:  70 year old male with CHF. EXAM: CHEST  2 VIEW COMPARISON:  01/05/2006 chest radiograph FINDINGS: Cardiomegaly and mild pulmonary vascular congestion noted. A left pacemaker/ ICD noted with tips overlying the right atrium and right ventricle. An NG tube is present with tip difficult to visualize. Mild bibasilar atelectasis noted. There is no evidence of pneumothorax or definite pleural effusion. IMPRESSION: Cardiomegaly with mild pulmonary vascular congestion and mild bibasilar atelectasis. Electronically Signed   By: Margarette Canada M.D.   On: 11/11/2015 17:46  Ct Abdomen Pelvis W Contrast: Result Date: 11/11/2015 CLINICAL DATA:  Lower abdominal pain for 2 days. EXAM: CT ABDOMEN AND PELVIS WITH CONTRAST TECHNIQUE: Multidetector CT imaging of the abdomen and pelvis was performed using the standard protocol following bolus administration of intravenous contrast. CONTRAST:  149mL ISOVUE-300 IOPAMIDOL (ISOVUE-300) INJECTION 61% COMPARISON:  None. FINDINGS: Mild atelectasis in the  lung bases. No other acute abnormalities seen within the lung bases. There is a fat containing umbilical hernia. A small amount of free fluid is seen within the pelvis. The stomach is mildly distended. The duodenum is normal in appearance. There is dilatation of small bowel from the proximal jejunum to the distal jejunum or proximal ileum. The most proximal dilated loops of small bowel such as on series 2, image 38 are also thick walled and edematous in appearance. The thick walled small bowel covers a long segment. Beyond the transition point, best seen on coronal image 32, the small bowel is decompressed. Colonic diverticulosis is seen without diverticulitis. The appendix is normal in appearance. Atherosclerosis is seen in the non aneurysmal aorta. There is hepatic steatosis. The gallbladder, portal vein,  and adrenal glands are normal. No acute abnormalities seen in the spleen. The pancreas is normal in appearance as well. The small cysts are seen in the kidneys with no hydronephrosis, stones, or suspicious masses. No adenopathy identified in the abdomen. The abdominal aorta is atherosclerotic. There is an abrupt cut off of the SMA as seen on axial image 44 and coronal image 65 with re- cannulization more distally. This is consistent with thrombus in the SMA, likely resulting in the thickened small bowel loops. The thrombus covers a distance of 4.7 cm. The pelvis demonstrates a small amount of free fluid. No adenopathy or mass. The bladder, prostate, and seminal vesicles are normal. The visualized bones are normal. IMPRESSION: 1. There is a small bowel obstruction with the transition point in the left lower quadrant. The thick walled loops of proximal small bowel are likely due to the SMA thrombus resulting in ischemia within these loops. 2. Low-attenuation in the right hepatic lobe on delayed images 1 and 2 is nonspecific and not seen on portal venous phase imaging. Recommend comparison with older films if available. 3. Prominent nodes in the gastrohepatic ligament and porta hepatis are likely reactive. Findings, including SMA thrombus and likely ischemic small bowel loops, were called to Dr. Reita Cliche at 3:45 p.m. Electronically Signed   By: Dorise Bullion III M.D   On: 11/11/2015 15:52  Dg Abd Acute W/chest: Result Date: 11/12/2015 CLINICAL DATA:  Mesenteric ischemia. EXAM: DG ABDOMEN ACUTE W/ 1V CHEST COMPARISON:  11/11/2015.  CT 11/11/2015. FINDINGS: Cardiac pacer with lead tips in right atrium right ventricle. Cardiomegaly with normal pulmonary vascularity. Low lung volumes with mild bibasilar atelectasis. Mild infiltrate right lung base cannot be excluded. Multiple dilated loops of small bowel noted. These findings are worrisome for small bowel obstruction. No free air. Vascular calcifications are noted.  IMPRESSION: 1.  Mild right base atelectasis and/or infiltrate. 2. Dilated loops of small bowel worrisome for small bowel obstruction. Electronically Signed   By: Marcello Moores  Register   On: 11/12/2015 09:09   EKG & Cardiac Imaging    EKG: Atrial fibrillation, HR 52 with V-paced complexes.  Echocardiogram: 11/2013 Clinical Diagnosesand EchocardiographicFindings Left ventricularhypertrophy Contractileleft ventriculardysfunction(severe) Decreasedleft ventricularejection 0000000) Diastolicleft ventriculardysfunction Elevatedleft ventricularfilling pressures Degenerativemitral valvedisease Mitralregurgitation (mild) Dilatedleft atrium Aorticsclerosis Dilatedthoracic aorta(see detail below) Pulmonaryhypertension(moderate - see detailbelow) Pulmonaryregurgitation(trivial) Contractileright ventriculardysfunction(moderate to severe) Tricuspidregurgitation(mild) Elevatedcentral venousand right atrialpressures (see detailbelow) Uppernormal to mildlydilated right atrium  Assessment & Plan    1. Chronic Atrial Fibrillation - This patients CHA2DS2-VASc Score and unadjusted Ischemic Stroke Rate (% per year) is equal to 9.7 % stroke rate/year from a score of 6 (CHF, HTN, DM, Age, TE (2)). Currently on Heparin. Resume Xarelto prior to discharge. Patient reports missing  several doses of Xarelto prior to admission due to forgetting to take the medications and presented with an embolic event. The importance of anticoagulation has been reviewed with the patient and his wife thoroughly. - rates currently well-controlled in the 70's. Resume BB once tolerating PO's. If HR becomes > 100, would use IV Lopressor.  2. Nonischemic Cardiomyopathy/ Chronic Systolic CHF - EF 99991111 by echo in 2015. S/p [employer] ICD implant in 2008, recent generator change in 11/2013. -  does not appear acutely volume overloaded on physical exam. - currently receiving IVF's in the setting of ischemic bowel and being NPO. Recommend being conservative with IVF in the setting of known reduced EF. - resume PTA BB, ARB, Spironolactone, and Lasix once able to take PO's.   3. HLD - continue statin therapy.  4. HTN - SBP elevated to 210 upon arrival to the ED, BP improved to 128/83 this AM.  - restart Coreg, Losartan, and Spironolactone once able to take PO's and as BP allows.  5. Ischemic Bowel, s/p SMA intervention on [id number] - embolic in etiology. Has chronic atrial fibrillation and has missed several doses of his Xarelto in the past few weeks. - reports significant improvement in his abdominal pain, nausea, and vomiting this AM.  - per Vascular and General Surgery  Signed, Erma Heritage, PA-C 11/12/2015, 9:52 AM Pager: [phone]

## 2015-11-13 DIAGNOSIS — Z794 Long term (current) use of insulin: Secondary | ICD-10-CM

## 2015-11-13 DIAGNOSIS — K55029 Acute infarction of small intestine, extent unspecified: Principal | ICD-10-CM

## 2015-11-13 DIAGNOSIS — E118 Type 2 diabetes mellitus with unspecified complications: Secondary | ICD-10-CM

## 2015-11-13 DIAGNOSIS — I420 Dilated cardiomyopathy: Secondary | ICD-10-CM

## 2015-11-13 LAB — GLUCOSE, CAPILLARY
GLUCOSE-CAPILLARY: 186 mg/dL — AB (ref 65–99)
GLUCOSE-CAPILLARY: 186 mg/dL — AB (ref 65–99)
GLUCOSE-CAPILLARY: 214 mg/dL — AB (ref 65–99)
Glucose-Capillary: 192 mg/dL — ABNORMAL HIGH (ref 65–99)
Glucose-Capillary: 210 mg/dL — ABNORMAL HIGH (ref 65–99)
Glucose-Capillary: 276 mg/dL — ABNORMAL HIGH (ref 65–99)

## 2015-11-13 LAB — CBC
HEMATOCRIT: 35.9 % — AB (ref 40.0–52.0)
HEMOGLOBIN: 11.7 g/dL — AB (ref 13.0–18.0)
MCH: 27.5 pg (ref 26.0–34.0)
MCHC: 32.6 g/dL (ref 32.0–36.0)
MCV: 84.3 fL (ref 80.0–100.0)
Platelets: 156 10*3/uL (ref 150–440)
RBC: 4.26 MIL/uL — ABNORMAL LOW (ref 4.40–5.90)
RDW: 18 % — ABNORMAL HIGH (ref 11.5–14.5)
WBC: 12.1 10*3/uL — ABNORMAL HIGH (ref 3.8–10.6)

## 2015-11-13 LAB — COMPREHENSIVE METABOLIC PANEL
ALK PHOS: 58 U/L (ref 38–126)
ALT: 16 U/L — AB (ref 17–63)
AST: 29 U/L (ref 15–41)
Albumin: 2.9 g/dL — ABNORMAL LOW (ref 3.5–5.0)
Anion gap: 7 (ref 5–15)
BUN: 37 mg/dL — AB (ref 6–20)
CALCIUM: 7.9 mg/dL — AB (ref 8.9–10.3)
CO2: 23 mmol/L (ref 22–32)
CREATININE: 1.89 mg/dL — AB (ref 0.61–1.24)
Chloride: 113 mmol/L — ABNORMAL HIGH (ref 101–111)
GFR calc non Af Amer: 35 mL/min — ABNORMAL LOW (ref >60)
GFR, EST AFRICAN AMERICAN: 41 mL/min — AB (ref >60)
GLUCOSE: 191 mg/dL — AB (ref 65–99)
Potassium: 3.7 mmol/L (ref 3.5–5.1)
SODIUM: 143 mmol/L (ref 135–145)
Total Bilirubin: 2.1 mg/dL — ABNORMAL HIGH (ref 0.3–1.2)
Total Protein: 6.1 g/dL — ABNORMAL LOW (ref 6.5–8.1)

## 2015-11-13 LAB — HEPARIN LEVEL (UNFRACTIONATED)
Heparin Unfractionated: 0.7 IU/mL (ref 0.30–0.70)
Heparin Unfractionated: 0.56 IU/mL (ref 0.30–0.70)

## 2015-11-13 LAB — APTT: aPTT: 106 seconds — ABNORMAL HIGH (ref 24–36)

## 2015-11-13 LAB — LACTIC ACID, PLASMA: Lactic Acid, Venous: 1.7 mmol/L (ref 0.5–1.9)

## 2015-11-13 MED ORDER — RIVAROXABAN 20 MG PO TABS
20.0000 mg | ORAL_TABLET | Freq: Every day | ORAL | Status: DC
  Administered 2015-11-13: 20 mg via ORAL
  Filled 2015-11-13 (×2): qty 2

## 2015-11-13 MED ORDER — HEPARIN BOLUS VIA INFUSION
3000.0000 [IU] | Freq: Once | INTRAVENOUS | Status: DC
  Administered 2015-11-13: 3 [IU] via INTRAVENOUS
  Filled 2015-11-13: qty 3000

## 2015-11-13 MED ORDER — INSULIN ASPART 100 UNIT/ML ~~LOC~~ SOLN
0.0000 [IU] | Freq: Three times a day (TID) | SUBCUTANEOUS | Status: DC
  Administered 2015-11-13: 5 [IU] via SUBCUTANEOUS
  Administered 2015-11-14: 8 [IU] via SUBCUTANEOUS
  Administered 2015-11-14: 3 [IU] via SUBCUTANEOUS
  Administered 2015-11-14: 8 [IU] via SUBCUTANEOUS
  Administered 2015-11-14: 5 [IU] via SUBCUTANEOUS
  Administered 2015-11-15 – 2015-11-16 (×3): 2 [IU] via SUBCUTANEOUS
  Administered 2015-11-17: 3 [IU] via SUBCUTANEOUS
  Administered 2015-11-17 (×2): 5 [IU] via SUBCUTANEOUS
  Administered 2015-11-17: 3 [IU] via SUBCUTANEOUS
  Administered 2015-11-18: 2 [IU] via SUBCUTANEOUS
  Administered 2015-11-18 (×3): 3 [IU] via SUBCUTANEOUS
  Administered 2015-11-19: 2 [IU] via SUBCUTANEOUS
  Administered 2015-11-19: 3 [IU] via SUBCUTANEOUS
  Administered 2015-11-19 (×2): 2 [IU] via SUBCUTANEOUS
  Administered 2015-11-20 (×2): 3 [IU] via SUBCUTANEOUS
  Administered 2015-11-20: 2 [IU] via SUBCUTANEOUS
  Administered 2015-11-21 – 2015-11-22 (×6): 3 [IU] via SUBCUTANEOUS
  Administered 2015-11-22: 5 [IU] via SUBCUTANEOUS
  Administered 2015-11-22 – 2015-11-23 (×3): 3 [IU] via SUBCUTANEOUS
  Filled 2015-11-13: qty 3
  Filled 2015-11-13: qty 8
  Filled 2015-11-13 (×4): qty 3
  Filled 2015-11-13: qty 2
  Filled 2015-11-13 (×3): qty 3
  Filled 2015-11-13 (×2): qty 2
  Filled 2015-11-13 (×3): qty 3
  Filled 2015-11-13: qty 2
  Filled 2015-11-13: qty 3
  Filled 2015-11-13: qty 2
  Filled 2015-11-13 (×5): qty 3
  Filled 2015-11-13: qty 8
  Filled 2015-11-13: qty 5
  Filled 2015-11-13: qty 2
  Filled 2015-11-13: qty 5
  Filled 2015-11-13: qty 2
  Filled 2015-11-13: qty 5
  Filled 2015-11-13: qty 3
  Filled 2015-11-13: qty 5
  Filled 2015-11-13: qty 3
  Filled 2015-11-13: qty 5

## 2015-11-13 MED ORDER — CARVEDILOL 3.125 MG PO TABS
3.1250 mg | ORAL_TABLET | Freq: Two times a day (BID) | ORAL | Status: DC
  Administered 2015-11-13 – 2015-11-16 (×7): 3.125 mg via ORAL
  Filled 2015-11-13 (×7): qty 1

## 2015-11-13 MED ORDER — INSULIN DETEMIR 100 UNIT/ML ~~LOC~~ SOLN
12.0000 [IU] | Freq: Two times a day (BID) | SUBCUTANEOUS | Status: DC
  Administered 2015-11-13 (×2): 12 [IU] via SUBCUTANEOUS
  Filled 2015-11-13 (×4): qty 0.12

## 2015-11-13 NOTE — Care Management (Signed)
Message left for patient's wife to contact RNCM to discuss discharge planning and any medication assistant needs. RNCM will continue to follow.

## 2015-11-13 NOTE — Progress Notes (Signed)
[facility] [place] NAME: Zam Ent    MR#:  [id number]  DATE OF BIRTH:  May 08, 1945  SUBJECTIVE:  Came in with abdominal pain found to have ischemic colon S/p angiogram/plasty of SMA due to occlusion Day2 Doing well. In the chair. No fever or abd pain REVIEW OF SYSTEMS:   Review of Systems  Constitutional: Negative for chills, fever and weight loss.  HENT: Negative for ear discharge, ear pain and nosebleeds.   Eyes: Negative for blurred vision, pain and discharge.  Respiratory: Negative for sputum production, shortness of breath, wheezing and stridor.   Cardiovascular: Negative for chest pain, palpitations, orthopnea and PND.  Gastrointestinal: Negative for abdominal pain, diarrhea, nausea and vomiting.  Genitourinary: Negative for frequency and urgency.  Musculoskeletal: Negative for back pain and joint pain.  Neurological: Positive for weakness. Negative for sensory change, speech change and focal weakness.  Psychiatric/Behavioral: Negative for depression and hallucinations. The patient is not nervous/anxious.    Tolerating Diet:sips Tolerating [id number]   DRUG ALLERGIES:   Allergies  Allergen Reactions  . Penicillins Rash    Has patient had a PCN reaction causing immediate rash, facial/tongue/throat swelling, SOB or lightheadedness with hypotension: Yes Has patient had a PCN reaction causing severe rash involving mucus membranes or skin necrosis: No Has patient had a PCN reaction that required hospitalization No Has patient had a PCN reaction occurring within the last 10 years: No If all of the above answers are "NO", then may proceed with Cephalosporin use.    VITALS:  Blood pressure 104/86, pulse 88, temperature 98 F (36.7 C), temperature source Oral, resp. rate (!) 21, height 6\' 2"  (1.88 m), weight 111.9 kg (246 lb 11.1 oz), SpO2 96 %.  PHYSICAL EXAMINATION:   Physical Exam  GENERAL:  70 y.o.-year-old  patient lying in the bed with no acute distress.  EYES: Pupils equal, round, reactive to light and accommodation. No scleral icterus. Extraocular muscles intact.  HEENT: Head atraumatic, normocephalic. Oropharynx and nasopharynx clear.  NECK:  Supple, no jugular venous distention. No thyroid enlargement, no tenderness.  LUNGS: Normal breath sounds bilaterally, no wheezing, rales, rhonchi. No use of accessory muscles of respiration.  CARDIOVASCULAR: S1, S2 normal. No murmurs, rubs, or gallops.  ABDOMEN: Soft, nontender, nondistended. Bowel sounds present. No organomegaly or mass.  EXTREMITIES: No cyanosis, clubbing or edema b/l.    NEUROLOGIC: Cranial nerves II through XII are intact. No focal Motor or sensory deficits b/l.   PSYCHIATRIC:  patient is alert and oriented x 3.  SKIN: No obvious rash, lesion, or ulcer.   LABORATORY PANEL:  CBC  Recent Labs Lab 11/13/15 0558  WBC 12.1*  HGB 11.7*  HCT 35.9*  PLT 156    Chemistries   Recent Labs Lab 11/13/15 0558  NA 143  K 3.7  CL 113*  CO2 23  GLUCOSE 191*  BUN 37*  CREATININE 1.89*  CALCIUM 7.9*  AST 29  ALT 16*  ALKPHOS 58  BILITOT 2.1*   Cardiac Enzymes  Recent Labs Lab 11/11/15 1020  TROPONINI 0.03*   RADIOLOGY:  Dg Chest 2 View  Result Date: 11/11/2015 CLINICAL DATA:  70 year old male with CHF. EXAM: CHEST  2 VIEW COMPARISON:  01/05/2006 chest radiograph FINDINGS: Cardiomegaly and mild pulmonary vascular congestion noted. A left pacemaker/ ICD noted with tips overlying the right atrium and right ventricle. An NG tube is present with tip difficult to visualize. Mild bibasilar atelectasis noted. There is no evidence of pneumothorax  or definite pleural effusion. IMPRESSION: Cardiomegaly with mild pulmonary vascular congestion and mild bibasilar atelectasis. Electronically Signed   By: Margarette Canada M.D.   On: 11/11/2015 17:46  Ct Abdomen Pelvis W Contrast  Result Date: 11/11/2015 CLINICAL DATA:  Lower abdominal pain  for 2 days. EXAM: CT ABDOMEN AND PELVIS WITH CONTRAST TECHNIQUE: Multidetector CT imaging of the abdomen and pelvis was performed using the standard protocol following bolus administration of intravenous contrast. CONTRAST:  150mL ISOVUE-300 IOPAMIDOL (ISOVUE-300) INJECTION 61% COMPARISON:  None. FINDINGS: Mild atelectasis in the lung bases. No other acute abnormalities seen within the lung bases. There is a fat containing umbilical hernia. A small amount of free fluid is seen within the pelvis. The stomach is mildly distended. The duodenum is normal in appearance. There is dilatation of small bowel from the proximal jejunum to the distal jejunum or proximal ileum. The most proximal dilated loops of small bowel such as on series 2, image 38 are also thick walled and edematous in appearance. The thick walled small bowel covers a long segment. Beyond the transition point, best seen on coronal image 32, the small bowel is decompressed. Colonic diverticulosis is seen without diverticulitis. The appendix is normal in appearance. Atherosclerosis is seen in the non aneurysmal aorta. There is hepatic steatosis. The gallbladder, portal vein, and adrenal glands are normal. No acute abnormalities seen in the spleen. The pancreas is normal in appearance as well. The small cysts are seen in the kidneys with no hydronephrosis, stones, or suspicious masses. No adenopathy identified in the abdomen. The abdominal aorta is atherosclerotic. There is an abrupt cut off of the SMA as seen on axial image 44 and coronal image 65 with re- cannulization more distally. This is consistent with thrombus in the SMA, likely resulting in the thickened small bowel loops. The thrombus covers a distance of 4.7 cm. The pelvis demonstrates a small amount of free fluid. No adenopathy or mass. The bladder, prostate, and seminal vesicles are normal. The visualized bones are normal. IMPRESSION: 1. There is a small bowel obstruction with the transition  point in the left lower quadrant. The thick walled loops of proximal small bowel are likely due to the SMA thrombus resulting in ischemia within these loops. 2. Low-attenuation in the right hepatic lobe on delayed images 1 and 2 is nonspecific and not seen on portal venous phase imaging. Recommend comparison with older films if available. 3. Prominent nodes in the gastrohepatic ligament and porta hepatis are likely reactive. Findings, including SMA thrombus and likely ischemic small bowel loops, were called to Dr. Reita Cliche at 3:45 p.m. Electronically Signed   By: Dorise Bullion III M.D   On: 11/11/2015 15:52  Dg Abd Acute W/chest  Result Date: 11/12/2015 CLINICAL DATA:  Mesenteric ischemia. EXAM: DG ABDOMEN ACUTE W/ 1V CHEST COMPARISON:  11/11/2015.  CT 11/11/2015. FINDINGS: Cardiac pacer with lead tips in right atrium right ventricle. Cardiomegaly with normal pulmonary vascularity. Low lung volumes with mild bibasilar atelectasis. Mild infiltrate right lung base cannot be excluded. Multiple dilated loops of small bowel noted. These findings are worrisome for small bowel obstruction. No free air. Vascular calcifications are noted. IMPRESSION: 1.  Mild right base atelectasis and/or infiltrate. 2. Dilated loops of small bowel worrisome for small bowel obstruction. Electronically Signed   By: Marcello Moores  Register   On: 11/12/2015 09:09  ASSESSMENT AND PLAN:    1.Ischemic bowel syndrome ([place]) -s/p angiogram/plasty of occluded branch of SMA -feels better -LA elevated although repeat trending down back  to normal  2.  Dilated cardiomyopathy ([place]) -stable -cont cardiac meds  3.  Type II diabetes mellitus with complication (HCC) -cont home meds and started on levemir 8 units bid  4.  Chronic systolic CHF (congestive heart failure) (HCC) -stable. Pt not in HF  5.  HTN -stable  6  Chronic atrial fibrillation (HCC) -will resume xarelto once heparin is d/ced  Case discussed with Care Management/Social  Worker. Management plans discussed with the patient, family and they are in agreement.  CODE STATUS:full  DVT Prophylaxis: heparin gtt  TOTAL TIME TAKING CARE OF THIS PATIENT: 30* minutes.  >50% time spent on counselling and coordination of care pt and wife  POSSIBLE D/C IN 1-2DAYS, DEPENDING ON CLINICAL CONDITION.  Note: This dictation was prepared with Dragon dictation along with smaller phrase technology. Any transcriptional errors that result from this process are unintentional.  Calyn Rubi M.D on 11/13/2015 at 12:05 PM  Between 7am to 6pm - Pager - [phone]  After 6pm go to [email or web address] - password [email or web address] [facility]  [facility]  Office  [phone]  CC: Primary care physician; No primary care provider on file.

## 2015-11-13 NOTE — Progress Notes (Signed)
70 yr old male POD#2 from SMA intervention for embolic occlusion.  Patient states doing well states no abdominal pain.  He does state that he feels slightly bloated and has not had any flatus. He states feeling very well today.  He was up and walking in room.   Vitals:   11/13/15 1107 11/13/15 1200  BP: 104/86 (!) 152/93  Pulse: 88 78  Resp:  18  Temp:     I/O last 3 completed shifts: In: [id number] [I.V.:5199; Other:450; IV Piggyback:2500] Out: 3350 [Urine:3350] Total I/O In: 735.7 [P.O.:240; I.V.:195.7; IV Piggyback:300] Out: 1000 [Urine:1000]   PE:  Gen: NAD Res: CTAb/L  Cardio: RRR Abd: soft, non-tender, non-distended Ext: 1+ edema, warm    A/P:  Patient continuing to improve, benign exam and lactic acid 1.7 today.  Will start clear liquids.  Will need to confer with Dr. Lucky Cowboy and Rockey Situ, patient was non-compliant with Xarelto, likely can restart that and transition off heparin.   Acute kidney injury: improving, baseline chronic kidney disease with Cr around 1.5, will d/c foley today  DM with hyperglycemia: appreciate IM help, lantus started today and q 4 checks  Will tx to floor today

## 2015-11-13 NOTE — Progress Notes (Signed)
ANTICOAGULATION CONSULT NOTE - Initial Consult  Pharmacy Consult for heparin drip Indication: Superior Mesenteric Artery Thrombosis   Allergies  Allergen Reactions  . Penicillins Rash    Has patient had a PCN reaction causing immediate rash, facial/tongue/throat swelling, SOB or lightheadedness with hypotension: Yes Has patient had a PCN reaction causing severe rash involving mucus membranes or skin necrosis: No Has patient had a PCN reaction that required hospitalization No Has patient had a PCN reaction occurring within the last 10 years: No If all of the above answers are "NO", then may proceed with Cephalosporin use.    Patient Measurements: Height: 6\' 2"  (188 cm) Weight: 246 lb 11.1 oz (111.9 kg) IBW/kg (Calculated) : 82.2 Heparin Dosing Weight: 104 kg  Vital Signs: Temp: 97.8 F (36.6 C) (07/25 0700) Temp Source: Oral (07/25 0700) BP: 123/64 (07/25 0700) Pulse Rate: 80 (07/25 0700)  Labs:  Recent Labs  11/11/15 1020 11/12/15 0009 11/12/15 0414  11/12/15 1428 11/12/15 2247 11/13/15 0558  HGB 13.6  --  12.5*  --   --   --  11.7*  HCT 41.2  --  37.2*  --   --   --  35.9*  PLT 175  --  160  --   --   --  156  APTT 34 43*  --   < > 57* 48* 106*  LABPROT 24.9*  --   --   --   --   --   --   INR 2.28  --   --   --   --   --   --   HEPARINUNFRC 2.00* 0.97*  --   --   --   --  0.70  CREATININE 1.54*  --  1.48*  --   --   --  1.89*  TROPONINI 0.03*  --   --   --   --   --   --   < > = values in this interval not displayed.  Estimated Creatinine Clearance: 50.5 mL/min (by C-G formula based on SCr of 1.89 mg/dL).   Medical History: Past Medical History:  Diagnosis Date  . Chronic atrial fibrillation (HCC)    a. on Xarelto  . Diabetes mellitus without complication ([place])   . Hyperlipemia   . Hypertension   . Nonischemic cardiomyopathy [facility])    a. s/p [employer] ICD placement 2008, generator change 11/2013  b. 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 Patient has a h/o afib on Xarelto with noncompliance. Pharmacy consulted for heparin drip management.     Goal of Therapy:  APTT 68-109 Heparin level 0.3-0.7 units/ml Monitor platelets by anticoagulation protocol: Yes   Plan:  Heparin level= 0.7. APTT= 106. Levels are in agreement and within range. Will continue heparin infusion at 1400 units/hr and recheck a HL in 6 hours.   Ulice Dash, PharmD Clinical Pharmacist  11/13/2015 8:55 AM

## 2015-11-13 NOTE — Progress Notes (Signed)
Inpatient Diabetes Program Recommendations  AACE/ADA: New Consensus Statement on Inpatient Glycemic Control (2015)  Target Ranges:  Prepandial:   less than 140 mg/dL      Peak postprandial:   less than 180 mg/dL (1-2 hours)      Critically ill patients:  140 - 180 mg/dL  Results for YOSSEF, RETANA (MRN [id number]) as of 11/13/2015 08:59  Ref. Range 11/12/2015 07:40 11/12/2015 11:02 11/12/2015 15:30 11/12/2015 20:36 11/12/2015 23:58 11/13/2015 04:03 11/13/2015 07:32  Glucose-Capillary Latest Ref Range: 65 - 99 mg/dL 295 (H) 282 (H) 205 (H) 176 (H) 164 (H) 186 (H) 192 (H)  Results for PRATT, REDA (MRN [id number]) as of 11/13/2015 08:59  Ref. Range 11/11/2015 10:20  Hemoglobin A1C Latest Ref Range: 4.0 - 6.0 % 9.1 (H)    Review of Glycemic Control  Diabetes history: DM2 Outpatient Diabetes medications: Levemir 65 units BID, Metformin 1000 mg BID, Victoza 1.8 mg daily Current orders for Inpatient glycemic control: Levemir 8 units BID, Novolog 0-15 units Q4H  Inpatient Diabetes Program Recommendations: Insulin - Basal: Please consider increasing Levemir to 11 units BID (based on 111 kg x 0.2 units). HgbA1C: A1C 9.1% on 11/11/15 indicating an average glucose of 214 mg/dl over the past 2-3 months. Recommend patient follow up with PCP or Endocrinologist regarding glycemic control.  Thanks, Barnie Alderman, RN, MSN, CDE Diabetes Coordinator Inpatient Diabetes Program [phone] (Team Pager from [place] to [place]) [phone] ([place] office) [phone] [facility] office) [phone] [facility] office)

## 2015-11-13 NOTE — Progress Notes (Signed)
ANTICOAGULATION CONSULT NOTE - Initial Consult  Pharmacy Consult for heparin drip Indication: Superior Mesenteric Artery Thrombosis   Allergies  Allergen Reactions  . Penicillins Rash    Has patient had a PCN reaction causing immediate rash, facial/tongue/throat swelling, SOB or lightheadedness with hypotension: Yes Has patient had a PCN reaction causing severe rash involving mucus membranes or skin necrosis: No Has patient had a PCN reaction that required hospitalization No Has patient had a PCN reaction occurring within the last 10 years: No If all of the above answers are "NO", then may proceed with Cephalosporin use.    Patient Measurements: Height: 6\' 2"  (188 cm) Weight: 246 lb 11.1 oz (111.9 kg) IBW/kg (Calculated) : 82.2 Heparin Dosing Weight: 104 kg  Vital Signs: Temp: 97.7 F (36.5 C) (07/24 1900) Temp Source: Oral (07/24 1900) BP: 95/70 (07/24 2300) Pulse Rate: 60 (07/24 2300)  Labs:  Recent Labs  11/11/15 1020 11/12/15 0009 11/12/15 0414 11/12/15 0800 11/12/15 1428 11/12/15 2247  HGB 13.6  --  12.5*  --   --   --   HCT 41.2  --  37.2*  --   --   --   PLT 175  --  160  --   --   --   APTT 34 43*  --  49* 57* 48*  LABPROT 24.9*  --   --   --   --   --   INR 2.28  --   --   --   --   --   HEPARINUNFRC 2.00* 0.97*  --   --   --   --   CREATININE 1.54*  --  1.48*  --   --   --   TROPONINI 0.03*  --   --   --   --   --     Estimated Creatinine Clearance: 64.5 mL/min (by C-G formula based on SCr of 1.48 mg/dL).   Medical History: Past Medical History:  Diagnosis Date  . Chronic atrial fibrillation (HCC)    a. on Xarelto  . Diabetes mellitus without complication ([place])   . Hyperlipemia   . Hypertension   . Nonischemic cardiomyopathy [facility])    a. s/p [employer] ICD placement 2008, generator change 11/2013  b. 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  Patient has a h/o afib on Xarelto with noncompliance. Pharmacy consulted for heparin drip management.     Goal of Therapy:  APTT 68-109 Heparin level 0.3-0.7 units/ml Monitor platelets by anticoagulation protocol: Yes   Plan:  APTT 57. Will increase heparin infusion to 1300 units/hr and recheck aPTT in 6 hours.   7/24 23:00 aPTT 48. Increase rate to 1400 units/hr. Recheck aPTT, CBC, and HL in 6 hours.  Ulice Dash, PharmD Clinical Pharmacist  11/13/2015 12:29 AM

## 2015-11-13 NOTE — Progress Notes (Addendum)
Patient: Alex Vance / Admit Date: 11/11/2015 / Date of Encounter: 11/13/2015, 8:25 AM   Subjective: Doing well this morning. No further abdominal pain. No chest pain or SOB. Has ambulated in his room without issues. Remains on heparin gtt. BP in the [id number] systolic this morning.   Review of Systems: Review of Systems  Constitutional: Positive for malaise/fatigue. Negative for chills, diaphoresis, fever and weight loss.  HENT: Negative for congestion.   Eyes: Negative for discharge and redness.  Respiratory: Negative for cough, sputum production, shortness of breath and wheezing.   Cardiovascular: Negative for chest pain, palpitations, orthopnea, claudication, leg swelling and PND.  Gastrointestinal: Negative for abdominal pain, heartburn, nausea and vomiting.  Musculoskeletal: Negative for falls and myalgias.  Skin: Negative for rash.  Neurological: Positive for weakness. Negative for dizziness, tingling, tremors, sensory change, speech change, focal weakness and loss of consciousness.  Endo/Heme/Allergies: Does not bruise/bleed easily.  Psychiatric/Behavioral: Negative for substance abuse. The patient is not nervous/anxious.     Objective: Telemetry: Afib, 80's bpm Physical Exam: Blood pressure 123/64, pulse 80, temperature 97.8 F (36.6 C), temperature source Oral, resp. rate 17, height 6\' 2"  (1.88 m), weight 246 lb 11.1 oz (111.9 kg), SpO2 96 %. Body mass index is 31.67 kg/m. General: Well developed, well nourished, in no acute distress. Head: Normocephalic, atraumatic, sclera non-icteric, no xanthomas, nares are without discharge. Neck: Negative for carotid bruits. JVP not elevated. Lungs: Clear bilaterally to auscultation without wheezes, rales, or rhonchi. Breathing is unlabored. Heart: Irregularly-irregular S1 S2 without murmurs, rubs, or gallops.  Abdomen: Soft, non-tender, non-distended with normoactive bowel sounds. No rebound/guarding. Extremities: No clubbing or  cyanosis. No edema. Distal pedal pulses are 2+ and equal bilaterally. Neuro: Alert and oriented X 3. Moves all extremities spontaneously. Psych:  Responds to questions appropriately with a normal affect.   Intake/Output Summary (Last 24 hours) at 11/13/15 0825 Last data filed at 11/13/15 0743  Gross per 24 hour  Intake          4489.65 ml  Output             2450 ml  Net          2039.65 ml    Inpatient Medications:  . ciprofloxacin  400 mg Intravenous Q12H  . famotidine (PEPCID) IV  20 mg Intravenous Q12H  . heparin  4,000 Units Intravenous Once  . insulin aspart  0-15 Units Subcutaneous Q4H  . insulin detemir  8 Units Subcutaneous BID  . ipratropium-albuterol  3 mL Nebulization QID  . metronidazole  500 mg Intravenous Q8H   Infusions:  . sodium chloride 100 mL/hr at 11/12/15 0700  . heparin 1,400 Units/hr (11/13/15 0736)    Labs:  Recent Labs  11/12/15 0414 11/13/15 0558  NA 138 143  K 3.8 3.7  CL 108 113*  CO2 22 23  GLUCOSE 293* 191*  BUN 31* 37*  CREATININE 1.48* 1.89*  CALCIUM 7.8* 7.9*    Recent Labs  11/11/15 1020 11/13/15 0558  AST 26 29  ALT 23 16*  ALKPHOS 92 58  BILITOT 1.6* 2.1*  PROT 7.9 6.1*  ALBUMIN 4.0 2.9*    Recent Labs  11/12/15 0414 11/13/15 0558  WBC 15.6* 12.1*  HGB 12.5* 11.7*  HCT 37.2* 35.9*  MCV 83.3 84.3  PLT 160 156    Recent Labs  11/11/15 1020  TROPONINI 0.03*   Invalid input(s): POCBNP  Recent Labs  11/11/15 1020  HGBA1C 9.1*     Weights: [employer]  Weights   11/11/15 0956 11/11/15 1744  Weight: 241 lb (109.3 kg) 246 lb 11.1 oz (111.9 kg)     Radiology/Studies:  Dg Chest 2 View  Result Date: 11/11/2015 CLINICAL DATA:  70 year old male with CHF. EXAM: CHEST  2 VIEW COMPARISON:  01/05/2006 chest radiograph FINDINGS: Cardiomegaly and mild pulmonary vascular congestion noted. A left pacemaker/ ICD noted with tips overlying the right atrium and right ventricle. An NG tube is present with tip difficult to  visualize. Mild bibasilar atelectasis noted. There is no evidence of pneumothorax or definite pleural effusion. IMPRESSION: Cardiomegaly with mild pulmonary vascular congestion and mild bibasilar atelectasis. Electronically Signed   By: Margarette Canada M.D.   On: 11/11/2015 17:46  Ct Abdomen Pelvis W Contrast  Result Date: 11/11/2015 CLINICAL DATA:  Lower abdominal pain for 2 days. EXAM: CT ABDOMEN AND PELVIS WITH CONTRAST TECHNIQUE: Multidetector CT imaging of the abdomen and pelvis was performed using the standard protocol following bolus administration of intravenous contrast. CONTRAST:  150mL ISOVUE-300 IOPAMIDOL (ISOVUE-300) INJECTION 61% COMPARISON:  None. FINDINGS: Mild atelectasis in the lung bases. No other acute abnormalities seen within the lung bases. There is a fat containing umbilical hernia. A small amount of free fluid is seen within the pelvis. The stomach is mildly distended. The duodenum is normal in appearance. There is dilatation of small bowel from the proximal jejunum to the distal jejunum or proximal ileum. The most proximal dilated loops of small bowel such as on series 2, image 38 are also thick walled and edematous in appearance. The thick walled small bowel covers a long segment. Beyond the transition point, best seen on coronal image 32, the small bowel is decompressed. Colonic diverticulosis is seen without diverticulitis. The appendix is normal in appearance. Atherosclerosis is seen in the non aneurysmal aorta. There is hepatic steatosis. The gallbladder, portal vein, and adrenal glands are normal. No acute abnormalities seen in the spleen. The pancreas is normal in appearance as well. The small cysts are seen in the kidneys with no hydronephrosis, stones, or suspicious masses. No adenopathy identified in the abdomen. The abdominal aorta is atherosclerotic. There is an abrupt cut off of the SMA as seen on axial image 44 and coronal image 65 with re- cannulization more distally. This is  consistent with thrombus in the SMA, likely resulting in the thickened small bowel loops. The thrombus covers a distance of 4.7 cm. The pelvis demonstrates a small amount of free fluid. No adenopathy or mass. The bladder, prostate, and seminal vesicles are normal. The visualized bones are normal. IMPRESSION: 1. There is a small bowel obstruction with the transition point in the left lower quadrant. The thick walled loops of proximal small bowel are likely due to the SMA thrombus resulting in ischemia within these loops. 2. Low-attenuation in the right hepatic lobe on delayed images 1 and 2 is nonspecific and not seen on portal venous phase imaging. Recommend comparison with older films if available. 3. Prominent nodes in the gastrohepatic ligament and porta hepatis are likely reactive. Findings, including SMA thrombus and likely ischemic small bowel loops, were called to Dr. Reita Cliche at 3:45 p.m. Electronically Signed   By: Dorise Bullion III M.D   On: 11/11/2015 15:52  Dg Abd Acute W/chest  Result Date: 11/12/2015 CLINICAL DATA:  Mesenteric ischemia. EXAM: DG ABDOMEN ACUTE W/ 1V CHEST COMPARISON:  11/11/2015.  CT 11/11/2015. FINDINGS: Cardiac pacer with lead tips in right atrium right ventricle. Cardiomegaly with normal pulmonary vascularity. Low lung volumes with mild  bibasilar atelectasis. Mild infiltrate right lung base cannot be excluded. Multiple dilated loops of small bowel noted. These findings are worrisome for small bowel obstruction. No free air. Vascular calcifications are noted. IMPRESSION: 1.  Mild right base atelectasis and/or infiltrate. 2. Dilated loops of small bowel worrisome for small bowel obstruction. 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 Chronic Afib: -Remains rate controlled -If BP remains in the [id number]' systolic ([id number] systolic last evening) would slowly restart medications, including BB at titrate to goal -On heparin gtt at this time, continue for now until cleared by surgery to restart Xarelto  -CHADs2VASc at least 4 (CHF, HTN, DM, age x 1)  2. NICM/chronic systolic CHF: -EF 99991111 by echo in 2015 -S/p [employer] ICD in 2008 with generator change in 11/2013 -He does not appear to be volume overloaded -Limit IV fluids -Restart lower dose Coreg as above -If BP remains stable plan to restart ARB, spiro, and Lasix  3. Ischemic bowel s/p SMA intervention 11/11/2015: -No further pain -In the setting of medication noncompliance with his Xarelto 2/2 Afib -Per surgery   4. HTN: -BP not as soft this morning -Restart medications as BP allows  5. HLD: -Statin  Signed, Christell Faith, PA-C [place] Pager: [phone] 11/13/2015, 8:25 AM   Attending Note Patient seen and examined, agree with detailed note above,  Patient presentation and plan discussed on rounds.   Comfortable this morning, Tolerating ice chips On IV fluids at 100 cc per hour Long discussion concerning importance of anticoagulation.  Reports his outpatient primary care and cardiology physicians have been changing. Being set up with a new "team"  Heart rate relatively well-controlled Telemetry reviewed, chronic atrial fibrillation Lungs relatively clear, heart sounds are regular no murmur appreciated, abdomen soft, mildly tender though not bad, no leg edema  Foley catheter in place Slight worsening in renal function with creatinine up to 1.89 He is on low-dose carvedilol,  We can reintroduce his medications as he starts eating in the next few days Heparin infusion for now until eating normal food and can change over to his NOAC   Greater than 50%  was spent in counseling and coordination of care with patient Total encounter time 25 minutes or more   Signed: Esmond Plants  M.D., Ph.D. [facility]

## 2015-11-13 NOTE — Progress Notes (Signed)
ANTICOAGULATION CONSULT NOTE - Initial Consult  Pharmacy Consult for heparin drip Indication: Superior Mesenteric Artery Thrombosis   Allergies  Allergen Reactions  . Penicillins Rash    Has patient had a PCN reaction causing immediate rash, facial/tongue/throat swelling, SOB or lightheadedness with hypotension: Yes Has patient had a PCN reaction causing severe rash involving mucus membranes or skin necrosis: No Has patient had a PCN reaction that required hospitalization No Has patient had a PCN reaction occurring within the last 10 years: No If all of the above answers are "NO", then may proceed with Cephalosporin use.    Patient Measurements: Height: 6\' 2"  (188 cm) Weight: 246 lb 11.1 oz (111.9 kg) IBW/kg (Calculated) : 82.2 Heparin Dosing Weight: 104 kg  Vital Signs: Temp: 98 F (36.7 C) (07/25 1100) Temp Source: Oral (07/25 1100) BP: 104/86 (07/25 1107) Pulse Rate: 88 (07/25 1107)  Labs:  Recent Labs  11/11/15 1020 11/12/15 0009 11/12/15 0414  11/12/15 1428 11/12/15 2247 11/13/15 0558 11/13/15 1234  HGB 13.6  --  12.5*  --   --   --  11.7*  --   HCT 41.2  --  37.2*  --   --   --  35.9*  --   PLT 175  --  160  --   --   --  156  --   APTT 34 43*  --   < > 57* 48* 106*  --   LABPROT 24.9*  --   --   --   --   --   --   --   INR 2.28  --   --   --   --   --   --   --   HEPARINUNFRC 2.00* 0.97*  --   --   --   --  0.70 0.56  CREATININE 1.54*  --  1.48*  --   --   --  1.89*  --   TROPONINI 0.03*  --   --   --   --   --   --   --   < > = values in this interval not displayed.  Estimated Creatinine Clearance: 50.5 mL/min (by C-G formula based on SCr of 1.89 mg/dL).   Medical History: Past Medical History:  Diagnosis Date  . Chronic atrial fibrillation (HCC)    a. on Xarelto  . Diabetes mellitus without complication ([place])   . Hyperlipemia   . Hypertension   . Nonischemic cardiomyopathy [facility])    a. s/p [employer] ICD placement 2008, generator change  11/2013  b. 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 Patient has a h/o afib on Xarelto with noncompliance. Pharmacy consulted for heparin drip management.     Goal of Therapy:  Heparin level 0.3-0.7 units/ml Monitor platelets by anticoagulation protocol: Yes   Plan:  Heparin level= 0.56. Will continue heparin drip at 1400 units/hr and f/u AM labs.   Ulice Dash, PharmD Clinical Pharmacist  11/13/2015 1:00 PM

## 2015-11-14 LAB — CBC
HEMATOCRIT: 33.7 % — AB (ref 40.0–52.0)
Hemoglobin: 11.1 g/dL — ABNORMAL LOW (ref 13.0–18.0)
MCH: 27.6 pg (ref 26.0–34.0)
MCHC: 33 g/dL (ref 32.0–36.0)
MCV: 83.6 fL (ref 80.0–100.0)
PLATELETS: 157 10*3/uL (ref 150–440)
RBC: 4.03 MIL/uL — ABNORMAL LOW (ref 4.40–5.90)
RDW: 17.6 % — AB (ref 11.5–14.5)
WBC: 6.6 10*3/uL (ref 3.8–10.6)

## 2015-11-14 LAB — GLUCOSE, CAPILLARY
GLUCOSE-CAPILLARY: 266 mg/dL — AB (ref 65–99)
GLUCOSE-CAPILLARY: 156 mg/dL — AB (ref 65–99)
Glucose-Capillary: 245 mg/dL — ABNORMAL HIGH (ref 65–99)

## 2015-11-14 LAB — APTT: APTT: 37 s — AB (ref 24–36)

## 2015-11-14 LAB — HEPARIN LEVEL (UNFRACTIONATED): Heparin Unfractionated: >3.6 IU/mL — ABNORMAL HIGH (ref 0.30–0.70)

## 2015-11-14 MED ORDER — RIVAROXABAN 20 MG PO TABS
20.0000 mg | ORAL_TABLET | Freq: Every day | ORAL | Status: DC
  Administered 2015-11-14 – 2015-11-16 (×3): 20 mg via ORAL
  Filled 2015-11-14 (×3): qty 1

## 2015-11-14 MED ORDER — IPRATROPIUM-ALBUTEROL 0.5-2.5 (3) MG/3ML IN SOLN: 3.0000 mL | RESPIRATORY_TRACT | Status: DC | PRN

## 2015-11-14 MED ORDER — INSULIN ASPART 100 UNIT/ML ~~LOC~~ SOLN: 4.0000 [IU] | Freq: Three times a day (TID) | SUBCUTANEOUS | Status: DC

## 2015-11-14 MED ORDER — INSULIN DETEMIR 100 UNIT/ML ~~LOC~~ SOLN
20.0000 [IU] | Freq: Two times a day (BID) | SUBCUTANEOUS | Status: DC
Start: 2015-11-14 — End: 2015-11-16
  Administered 2015-11-14 – 2015-11-15 (×4): 20 [IU] via SUBCUTANEOUS
  Filled 2015-11-14 (×6): qty 0.2

## 2015-11-14 MED ORDER — FUROSEMIDE 40 MG PO TABS
40.0000 mg | ORAL_TABLET | Freq: Every day | ORAL | Status: DC
  Administered 2015-11-14: 40 mg via ORAL
  Filled 2015-11-14: qty 1

## 2015-11-14 MED ORDER — SIMVASTATIN 20 MG PO TABS
20.0000 mg | ORAL_TABLET | Freq: Every day | ORAL | Status: DC
  Administered 2015-11-14 – 2015-11-22 (×7): 20 mg via ORAL
  Filled 2015-11-14 (×9): qty 1

## 2015-11-14 MED ORDER — PANTOPRAZOLE SODIUM 40 MG PO TBEC
40.0000 mg | DELAYED_RELEASE_TABLET | Freq: Every day | ORAL | Status: DC
  Administered 2015-11-14 – 2015-11-23 (×10): 40 mg via ORAL
  Filled 2015-11-14 (×10): qty 1

## 2015-11-14 MED ORDER — HEPARIN (PORCINE) IN NACL 100-0.45 UNIT/ML-% IJ SOLN
1400.0000 [IU]/h | INTRAMUSCULAR | Status: DC
  Filled 2015-11-14 (×2): qty 250

## 2015-11-14 MED ORDER — LOSARTAN POTASSIUM 50 MG PO TABS
100.0000 mg | ORAL_TABLET | Freq: Every day | ORAL | Status: DC
  Administered 2015-11-14 – 2015-11-21 (×8): 100 mg via ORAL
  Filled 2015-11-14 (×8): qty 2

## 2015-11-14 MED ORDER — SPIRONOLACTONE 25 MG PO TABS
25.0000 mg | ORAL_TABLET | Freq: Every day | ORAL | Status: DC
  Administered 2015-11-14 – 2015-11-21 (×8): 25 mg via ORAL
  Filled 2015-11-14 (×8): qty 1

## 2015-11-14 MED ORDER — INSULIN DETEMIR 100 UNIT/ML ~~LOC~~ SOLN
15.0000 [IU] | Freq: Two times a day (BID) | SUBCUTANEOUS | Status: DC
Start: 2015-11-14 — End: 2015-11-14
  Filled 2015-11-14 (×2): qty 0.15

## 2015-11-14 MED ORDER — FUROSEMIDE 40 MG PO TABS: 40.0000 mg | ORAL_TABLET | ORAL | Status: DC

## 2015-11-14 MED ORDER — INSULIN ASPART 100 UNIT/ML ~~LOC~~ SOLN
8.0000 [IU] | Freq: Three times a day (TID) | SUBCUTANEOUS | Status: DC
  Administered 2015-11-14 – 2015-11-15 (×3): 8 [IU] via SUBCUTANEOUS
  Filled 2015-11-14 (×3): qty 8

## 2015-11-14 MED ORDER — HEPARIN BOLUS VIA INFUSION
2000.0000 [IU] | Freq: Once | INTRAVENOUS | Status: DC
  Filled 2015-11-14: qty 2000

## 2015-11-14 MED ORDER — FUROSEMIDE 40 MG PO TABS: 80.0000 mg | ORAL_TABLET | Freq: Every day | ORAL | Status: DC

## 2015-11-14 NOTE — Progress Notes (Signed)
MD aware of 14 beat run of [place]. Now new orders.

## 2015-11-14 NOTE — Progress Notes (Signed)
    He remains in rate-controlled Afib with heart rates in the 80's to 90's bpm. No heart failure symptoms. Continue heparin gtt until we are sure he is absorbing PO. At that time can transition to PO anticoagulation and reintroduce heart failure medications.

## 2015-11-14 NOTE — Progress Notes (Addendum)
Inpatient Diabetes Program Recommendations  AACE/ADA: New Consensus Statement on Inpatient Glycemic Control (2015)  Target Ranges:  Prepandial:   less than 140 mg/dL      Peak postprandial:   less than 180 mg/dL (1-2 hours)      Critically ill patients:  140 - 180 mg/dL   Results for Alex Vance, Alex Vance (MRN [id number]) as of 11/14/2015 08:42  Ref. Range 11/13/2015 07:32 11/13/2015 11:13 11/13/2015 11:32 11/13/2015 15:20 11/13/2015 21:51 11/14/2015 07:58  Glucose-Capillary Latest Ref Range: 65 - 99 mg/dL 192 (H) 186 (H) 214 (H) 210 (H) 276 (H) 266 (H)   Review of Glycemic Control  Diabetes history: DM2 Outpatient Diabetes medications: Levemir 65 units BID, Metformin 1000 mg BID, Victoza 1.8 mg daily Current orders for Inpatient glycemic control: Levemir 12 units BID, Novolog 0-15 units ACHS  Inpatient Diabetes Program Recommendations: Insulin - Basal: Please consider increasing Levemir to 15 units BID. Insulin - Meal Coverage: Please consider ordering Novolog 4 units TID with meals for meal coverage if patient is eating at least 50% of meals. HgbA1C: A1C 9.1% on 11/11/15 indicating an average glucose of 214 mg/dl over the past 2-3 months. Recommend patient follow up with PCP or Endocrinologist regarding glycemic control.  Thanks, Barnie Alderman, RN, MSN, CDE Diabetes Coordinator Inpatient Diabetes Program [phone] (Team Pager from [place] to [place]) [phone] ([place] office) [phone] [facility] office) [phone] [facility] office)

## 2015-11-14 NOTE — Consult Note (Signed)
ANTICOAGULATION CONSULT NOTE - Initial Consult  Pharmacy Consult for heparin drip Indication: Superior Mesenteric Artery Thrombosis s/p intervention  Allergies  Allergen Reactions  . Penicillins Rash    Has patient had a PCN reaction causing immediate rash, facial/tongue/throat swelling, SOB or lightheadedness with hypotension: Yes Has patient had a PCN reaction causing severe rash involving mucus membranes or skin necrosis: No Has patient had a PCN reaction that required hospitalization No Has patient had a PCN reaction occurring within the last 10 years: No If all of the above answers are "NO", then may proceed with Cephalosporin use.    Patient Measurements: Height: 6\' 2"  (188 cm) Weight: 246 lb 11.1 oz (111.9 kg) IBW/kg (Calculated) : 82.2 Heparin Dosing Weight: 104kg  Vital Signs: Temp: 97.4 F (36.3 C) (07/26 0450) Temp Source: Oral (07/26 0450) BP: 144/77 (07/26 0840) Pulse Rate: 76 (07/26 0840)  Labs:  Recent Labs  11/12/15 0414  11/12/15 1428 11/12/15 2247 11/13/15 0558 11/13/15 1234 11/14/15 0435  HGB 12.5*  --   --   --  11.7*  --  11.1*  HCT 37.2*  --   --   --  35.9*  --  33.7*  PLT 160  --   --   --  156  --  157  APTT  --   < > 57* 48* 106*  --   --   HEPARINUNFRC  --   --   --   --  0.70 0.56 >3.60*  CREATININE 1.48*  --   --   --  1.89*  --   --   < > = values in this interval not displayed.  Estimated Creatinine Clearance: 50.5 mL/min (by C-G formula based on SCr of 1.89 mg/dL).   Medical History: Past Medical History:  Diagnosis Date  . Chronic atrial fibrillation (HCC)    a. on Xarelto  . Diabetes mellitus without complication ([place])   . Hyperlipemia   . Hypertension   . Nonischemic cardiomyopathy [facility])    a. s/p [employer] ICD placement 2008, generator change 11/2013  b. Echo 2015: EF 30-35%    Medications:  Scheduled:  . carvedilol  3.125 mg Oral BID WC  . ciprofloxacin  400 mg Intravenous Q12H  . famotidine (PEPCID) IV  20  mg Intravenous Q12H  . insulin aspart  0-15 Units Subcutaneous TID AC & HS  . insulin aspart  8 Units Subcutaneous TID WC  . insulin detemir  20 Units Subcutaneous BID  . ipratropium-albuterol  3 mL Nebulization QID  . metronidazole  500 mg Intravenous Q8H    Assessment: Pt is a 70 year old male with a superior mesenteric artery occlusion. S/p intervention. Patient was on heparin drip and was transitioned to xarelto last evening. Cardiology is concerned that patient is not yet able to absorb oral medications and would like pt to be restarted on heparin drip.  Patient HL this AM off of the heparin was 3.60 therefore he probably absorb at least some of the medication. Per protocol must wait 24hr from last xarelto dose before starting heparin drip. Discussed this with PA Dunn who is in agreement.   Goal of Therapy:  Heparin level 0.3-0.7 units/ml Monitor platelets by anticoagulation protocol: Yes  Will need to use APTT initially goal 66-102, until HL and APTT corollate.   Plan:  Will restart drip at previous rate as pt was therapeutic at 1400 units/hr. Will bolus 2000 units Start heparin infusion at 1400 units/hr Check anti-Xa level in 6  hours and daily while on heparin Continue to monitor H&H and platelets Called RN and informed her of the plan Carollee Massed Oryn Casanova 11/14/2015,11:25 AM

## 2015-11-14 NOTE — Care Management Important Message (Signed)
Important Message  Patient Details  Name: Alex Vance MRN: [id number] Date of Birth: [birth year]   Medicare Important Message Given:  Yes    Beverly Sessions, RN 11/14/2015, 2:09 PM

## 2015-11-14 NOTE — Care Management (Signed)
Patient admitted for Ischemic bowel syndrome .  Patient states that he lives at home alone.  Has a girlfriend of 25 years, who is available for support.  She provides his transportation. Patient obtains his medication from [facility].  Patient denies any issues obtaining his medications.  Patient states "i am able to get my xarelto and plan on taking it when I get home".  Patient receives Medicaid aid  Services 3 days a week for a total of 5.5  Hours per week.  Per nursing patient has no mobility needs.  Patient states that he has a cane in the home should he need it.  RNCM available for discharge planning

## 2015-11-14 NOTE — Consult Note (Addendum)
ANTICOAGULATION CONSULT NOTE - Initial Consult  Pharmacy Consult for Rivaroxaban Indication: AFib  Allergies  Allergen Reactions  . Penicillins Rash    Has patient had a PCN reaction causing immediate rash, facial/tongue/throat swelling, SOB or lightheadedness with hypotension: Yes Has patient had a PCN reaction causing severe rash involving mucus membranes or skin necrosis: No Has patient had a PCN reaction that required hospitalization No Has patient had a PCN reaction occurring within the last 10 years: No If all of the above answers are "NO", then may proceed with Cephalosporin use.    Patient Measurements: Height: 6\' 2"  (188 cm) Weight: 246 lb 11.1 oz (111.9 kg) IBW/kg (Calculated) : 82.2  Vital Signs: Temp: 97.5 F (36.4 C) (07/26 1316) Temp Source: Oral (07/26 1316) BP: 141/75 (07/26 1316) Pulse Rate: 91 (07/26 1316)  Labs:  Recent Labs  11/12/15 0414  11/12/15 2247 11/13/15 0558 11/13/15 1234 11/14/15 0435 11/14/15 1157  HGB 12.5*  --   --  11.7*  --  11.1*  --   HCT 37.2*  --   --  35.9*  --  33.7*  --   PLT 160  --   --  156  --  157  --   APTT  --   < > 48* 106*  --   --  37*  HEPARINUNFRC  --   --   --  0.70 0.56 >3.60*  --   CREATININE 1.48*  --   --  1.89*  --   --   --   < > = values in this interval not displayed.  Estimated Creatinine Clearance: 50.5 mL/min (by C-G formula based on SCr of 1.89 mg/dL).  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 S/p intervention. Patient was on heparin drip and was transitioned to xarelto last evening. Per cards plan was to restart heparin drip tonight.  Patient HL this AM off of the heparin was 3.60. Per protocol must wait 24hr from last xarelto dose before starting heparin drip.  Per surgery plan to d/c heparin drip and restart rivaroxaban.  CrCl ~ 60 ml/min with total body weight.     Plan:  D/c heparin drip (heparin drip has not yet been started today). Will restart  rivaroxaban 20 mg PO daily with supper.  Pt will need SCr and CBC q3 days.  Called RN and informed her of the plan   Alex Vance 11/14/2015,6:25 PM

## 2015-11-14 NOTE — Progress Notes (Signed)
70 yr old male POD#3 from SMA intervention for embolic occlusion.  Patient doing well tolerated clear liquids without any nausea.  He has been up and moving around in room.  He has had a BM, states minimal flatus.   Vitals:   11/14/15 0840 11/14/15 1316  BP: (!) 144/77 (!) 141/75  Pulse: 76 91  Resp:  19  Temp:  97.5 F (36.4 C)   I/O last 3 completed shifts: In: 3505.5 [P.O.:240; I.V.:2565.5; IV Piggyback:700] Out: 2776 [Urine:2775; Stool:1] Total I/O In: 369 [I.V.:369] Out: 275 [Urine:275]   PE:  Gen: NAD Res: CTAb/L  Cardio: RRR Abd: soft, non-tender, slightly distended Ext: 1+ edema, warm   CBC Latest Ref Rng & Units 11/14/2015 11/13/2015 11/12/2015  WBC 3.8 - 10.6 K/uL 6.6 12.1(H) 15.6(H)  Hemoglobin 13.0 - 18.0 g/dL 11.1(L) 11.7(L) 12.5(L)  Hematocrit 40.0 - 52.0 % 33.7(L) 35.9(L) 37.2(L)  Platelets 150 - 440 K/uL 157 156 160   CMP Latest Ref Rng & Units 11/13/2015 11/12/2015 11/11/2015  Glucose 65 - 99 mg/dL 191(H) 293(H) 267(H)  BUN 6 - 20 mg/dL 37(H) 31(H) 27(H)  Creatinine 0.61 - 1.24 mg/dL 1.89(H) 1.48(H) 1.54(H)  Sodium 135 - 145 mmol/L 143 138 135  Potassium 3.5 - 5.1 mmol/L 3.7 3.8 4.0  Chloride 101 - 111 mmol/L 113(H) 108 104  CO2 22 - 32 mmol/L 23 22 24   Calcium 8.9 - 10.3 mg/dL 7.9(L) 7.8(L) 8.8(L)  Total Protein 6.5 - 8.1 g/dL 6.1(L) - 7.9  Total Bilirubin 0.3 - 1.2 mg/dL 2.1(H) - 1.6(H)  Alkaline Phos 38 - 126 U/L 58 - 92  AST 15 - 41 U/L 29 - 26  ALT 17 - 63 U/L 16(L) - 23    A/P:  Patient doing well.  Will start Diabetic diet today and start Xarelto and d/c heparin tomorrow  Acute on chronic kidney injury: improving, voiding well  DM with hyperglycemia: appreciate IM help,levemir increased today  Likely will d/c home in the next couple days.

## 2015-11-14 NOTE — Progress Notes (Signed)
[place] at [place] NAME: Alex Vance    MR#:  [id number]  DATE OF BIRTH:  [birth year]  SUBJECTIVE:  Came in with abdominal pain found to have ischemic colon S/p angiogram/plasty of SMA due to occlusion Day 3 Doing well. In the chair. No fever or abd pain REVIEW OF SYSTEMS:   Review of Systems  Constitutional: Negative for chills, fever and weight loss.  HENT: Negative for ear discharge, ear pain and nosebleeds.   Eyes: Negative for blurred vision, pain and discharge.  Respiratory: Negative for sputum production, shortness of breath, wheezing and stridor.   Cardiovascular: Negative for chest pain, palpitations, orthopnea and PND.  Gastrointestinal: Negative for abdominal pain, diarrhea, nausea and vomiting.  Genitourinary: Negative for frequency and urgency.  Musculoskeletal: Negative for back pain and joint pain.  Neurological: Positive for weakness. Negative for sensory change, speech change and focal weakness.  Psychiatric/Behavioral: Negative for depression and hallucinations. The patient is not nervous/anxious.    Tolerating Diet:regular food Tolerating [id number]   DRUG ALLERGIES:   Allergies  Allergen Reactions  . Penicillins Rash    Has patient had a PCN reaction causing immediate rash, facial/tongue/throat swelling, SOB or lightheadedness with hypotension: Yes Has patient had a PCN reaction causing severe rash involving mucus membranes or skin necrosis: No Has patient had a PCN reaction that required hospitalization No Has patient had a PCN reaction occurring within the last 10 years: No If all of the above answers are "NO", then may proceed with Cephalosporin use.    VITALS:  Blood pressure (!) 141/75, pulse 91, temperature 97.5 F (36.4 C), temperature source Oral, resp. rate 19, height 6\' 2"  (1.88 m), weight 111.9 kg (246 lb 11.1 oz), SpO2 100 %.  PHYSICAL EXAMINATION:   Physical Exam  GENERAL:  70  y.o.-year-old patient lying in the bed with no acute distress.  EYES: Pupils equal, round, reactive to light and accommodation. No scleral icterus. Extraocular muscles intact.  HEENT: Head atraumatic, normocephalic. Oropharynx and nasopharynx clear.  NECK:  Supple, no jugular venous distention. No thyroid enlargement, no tenderness.  LUNGS: Normal breath sounds bilaterally, no wheezing, rales, rhonchi. No use of accessory muscles of respiration.  CARDIOVASCULAR: S1, S2 normal. No murmurs, rubs, or gallops.  ABDOMEN: Soft, nontender, nondistended. Bowel sounds present. No organomegaly or mass.  EXTREMITIES: No cyanosis, clubbing or edema b/l.    NEUROLOGIC: Cranial nerves II through XII are intact. No focal Motor or sensory deficits b/l.   PSYCHIATRIC:  patient is alert and oriented x 3.  SKIN: No obvious rash, lesion, or ulcer.   LABORATORY PANEL:  CBC  Recent Labs Lab 11/14/15 0435  WBC 6.6  HGB 11.1*  HCT 33.7*  PLT 157    Chemistries   Recent Labs Lab 11/13/15 0558  NA 143  K 3.7  CL 113*  CO2 23  GLUCOSE 191*  BUN 37*  CREATININE 1.89*  CALCIUM 7.9*  AST 29  ALT 16*  ALKPHOS 58  BILITOT 2.1*   Cardiac Enzymes  Recent Labs Lab 11/11/15 1020  TROPONINI 0.03*   RADIOLOGY:  No results found. ASSESSMENT AND PLAN:    1.Ischemic bowel syndrome ([place]) -s/p angiogram/plasty of occluded branch of SMA -feels better -LA elevated although repeat trending down back to normal  2.  Dilated cardiomyopathy ([place]) -stable -cont cardiac meds  3.  Type II diabetes mellitus with complication (HCC) -cont home meds and started on levemir 8 units bid  4.  Chronic  systolic CHF (congestive heart failure) (HCC) -stable. Pt not in HF  5.  HTN -stable  6  Chronic atrial fibrillation (HCC) -will resume xarelto once heparin is d/ced  Case discussed with Care Management/Social Worker. Management plans discussed with the patient, family and they are in agreement.  CODE  STATUS:full  DVT Prophylaxis: heparin gtt  TOTAL TIME TAKING CARE OF THIS PATIENT: 30* minutes.  >50% time spent on counselling and coordination of care pt and wife  POSSIBLE D/C IN 1-2DAYS, DEPENDING ON CLINICAL CONDITION.  Note: This dictation was prepared with Dragon dictation along with smaller phrase technology. Any transcriptional errors that result from this process are unintentional.  Natika Geyer M.D on 11/14/2015 at 1:28 PM  Between 7am to 6pm - Pager - [phone]  After 6pm go to [email or web address] - password [email or web address] [facility]  [place] [facility]  Office  [phone]  CC: Primary care physician; No primary care provider on file.

## 2015-11-15 ENCOUNTER — Inpatient Hospital Stay: Payer: Medicare Other

## 2015-11-15 DIAGNOSIS — K567 Ileus, unspecified: Secondary | ICD-10-CM

## 2015-11-15 DIAGNOSIS — K9189 Other postprocedural complications and disorders of digestive system: Secondary | ICD-10-CM

## 2015-11-15 DIAGNOSIS — K913 Postprocedural intestinal obstruction: Secondary | ICD-10-CM

## 2015-11-15 DIAGNOSIS — I509 Heart failure, unspecified: Secondary | ICD-10-CM

## 2015-11-15 LAB — GLUCOSE, CAPILLARY
GLUCOSE-CAPILLARY: 91 mg/dL (ref 65–99)
GLUCOSE-CAPILLARY: 61 mg/dL — AB (ref 65–99)
GLUCOSE-CAPILLARY: 73 mg/dL (ref 65–99)
Glucose-Capillary: 121 mg/dL — ABNORMAL HIGH (ref 65–99)
Glucose-Capillary: 64 mg/dL — ABNORMAL LOW (ref 65–99)
Glucose-Capillary: 72 mg/dL (ref 65–99)
Glucose-Capillary: 75 mg/dL (ref 65–99)

## 2015-11-15 LAB — CBC
HCT: 36.5 % — ABNORMAL LOW (ref 40.0–52.0)
HEMOGLOBIN: 12 g/dL — AB (ref 13.0–18.0)
MCH: 27.3 pg (ref 26.0–34.0)
MCHC: 32.9 g/dL (ref 32.0–36.0)
MCV: 82.9 fL (ref 80.0–100.0)
Platelets: 168 10*3/uL (ref 150–440)
RBC: 4.4 MIL/uL (ref 4.40–5.90)
RDW: 17.6 % — ABNORMAL HIGH (ref 11.5–14.5)
WBC: 8.7 10*3/uL (ref 3.8–10.6)

## 2015-11-15 LAB — BASIC METABOLIC PANEL
ANION GAP: 7 (ref 5–15)
BUN: 17 mg/dL (ref 6–20)
CHLORIDE: 114 mmol/L — AB (ref 101–111)
CO2: 23 mmol/L (ref 22–32)
Calcium: 8.4 mg/dL — ABNORMAL LOW (ref 8.9–10.3)
Creatinine, Ser: 1.23 mg/dL (ref 0.61–1.24)
GFR calc Af Amer: >60 mL/min (ref >60)
GFR, EST NON AFRICAN AMERICAN: 59 mL/min — AB (ref >60)
Glucose, Bld: 92 mg/dL (ref 65–99)
POTASSIUM: 3.2 mmol/L — AB (ref 3.5–5.1)
SODIUM: 144 mmol/L (ref 135–145)

## 2015-11-15 MED ORDER — BISACODYL 10 MG RE SUPP
10.0000 mg | Freq: Every day | RECTAL | Status: DC
  Administered 2015-11-15 – 2015-11-21 (×7): 10 mg via RECTAL
  Filled 2015-11-15 (×7): qty 1

## 2015-11-15 MED ORDER — FUROSEMIDE 40 MG PO TABS
40.0000 mg | ORAL_TABLET | Freq: Every day | ORAL | Status: DC
  Administered 2015-11-15 – 2015-11-21 (×7): 40 mg via ORAL
  Filled 2015-11-15 (×7): qty 1

## 2015-11-15 MED ORDER — SODIUM CHLORIDE 0.9 % IV SOLN: INTRAVENOUS | Status: DC

## 2015-11-15 MED ORDER — HYDROCODONE-ACETAMINOPHEN 5-325 MG PO TABS
1.0000 | ORAL_TABLET | Freq: Four times a day (QID) | ORAL | Status: DC | PRN
  Administered 2015-11-15 (×2): 2 via ORAL
  Filled 2015-11-15 (×2): qty 2

## 2015-11-15 MED ORDER — POLYETHYLENE GLYCOL 3350 17 G PO PACK
17.0000 g | PACK | Freq: Every day | ORAL | Status: DC
  Filled 2015-11-15: qty 1

## 2015-11-15 NOTE — Progress Notes (Signed)
70 yr old male POD#4 from SMA intervention for embolic occlusion.  Patient tolerated regular diet yesterday but did have one episode of vomiting this AM.  Patient states no more nausea afterward and has been eating fine since.  He did have another BM but has not been passing any flatus.  He was up and walking yesterday, will walk today as well. He is anxious to go home.   Vitals:   11/15/15 0427 11/15/15 1243  BP: 133/72 (!) 150/78  Pulse: 84 87  Resp: 20 17  Temp: 98.6 F (37 C) 97.5 F (36.4 C)   I/O last 3 completed shifts: In: 2309.8 [P.O.:240; I.V.:1489.8; IV Piggyback:580] Out: 2151 [Urine:1150; Emesis/NG output:1000; Stool:1] Total I/O In: 240 [P.O.:240] Out: 250 [Urine:250]   PE:  Gen: NAD Res: CTAb/L  Cardio: RRR Abd: soft, non-tender, slightly distended Ext: 1+ edema, warm   CBC Latest Ref Rng & Units 11/15/2015 11/14/2015 11/13/2015  WBC 3.8 - 10.6 K/uL 8.7 6.6 12.1(H)  Hemoglobin 13.0 - 18.0 g/dL 12.0(L) 11.1(L) 11.7(L)  Hematocrit 40.0 - 52.0 % 36.5(L) 33.7(L) 35.9(L)  Platelets 150 - 440 K/uL 168 157 156   CMP Latest Ref Rng & Units 11/15/2015 11/13/2015 11/12/2015  Glucose 65 - 99 mg/dL 92 191(H) 293(H)  BUN 6 - 20 mg/dL 17 37(H) 31(H)  Creatinine 0.61 - 1.24 mg/dL 1.23 1.89(H) 1.48(H)  Sodium 135 - 145 mmol/L 144 143 138  Potassium 3.5 - 5.1 mmol/L 3.2(L) 3.7 3.8  Chloride 101 - 111 mmol/L 114(H) 113(H) 108  CO2 22 - 32 mmol/L 23 23 22   Calcium 8.9 - 10.3 mg/dL 8.4(L) 7.9(L) 7.8(L)  Total Protein 6.5 - 8.1 g/dL - 6.1(L) -  Total Bilirubin 0.3 - 1.2 mg/dL - 2.1(H) -  Alkaline Phos 38 - 126 U/L - 58 -  AST 15 - 41 U/L - 29 -  ALT 17 - 63 U/L - 16(L) -    A/P:  Patient doing well.  Likely with a postop ileus, Abd xray showed some dilated loops of small bowel but with air in the colon.  Will give suppository and miralax today  Afib: rate controlled, restarted xarelto, appreciate cards help  Acute on chronic kidney injury: improving, voiding well  DM with  hyperglycemia: appreciate IM help,levemir increased today  Likely will d/c home in the next couple days.

## 2015-11-15 NOTE — Progress Notes (Signed)
[place] at [place] NAME: Alex Vance    MR#:  [id number]  DATE OF BIRTH:  [birth year]  SUBJECTIVE:  Came in with abdominal pain found to have ischemic colon S/p angiogram/plasty of SMA due to occlusion Day 4 Episode of vomiting last nite about 1000 ml  No fever or abd pain  Pt wants to go home REVIEW OF SYSTEMS:   Review of Systems  Constitutional: Negative for chills, fever and weight loss.  HENT: Negative for ear discharge, ear pain and nosebleeds.   Eyes: Negative for blurred vision, pain and discharge.  Respiratory: Negative for sputum production, shortness of breath, wheezing and stridor.   Cardiovascular: Negative for chest pain, palpitations, orthopnea and PND.  Gastrointestinal: Negative for abdominal pain, diarrhea, nausea and vomiting.  Genitourinary: Negative for frequency and urgency.  Musculoskeletal: Negative for back pain and joint pain.  Neurological: Positive for weakness. Negative for sensory change, speech change and focal weakness.  Psychiatric/Behavioral: Negative for depression and hallucinations. The patient is not nervous/anxious.    Tolerating Diet:regular food Tolerating [id number]  DRUG ALLERGIES:   Allergies  Allergen Reactions  . Penicillins Rash    Has patient had a PCN reaction causing immediate rash, facial/tongue/throat swelling, SOB or lightheadedness with hypotension: Yes Has patient had a PCN reaction causing severe rash involving mucus membranes or skin necrosis: No Has patient had a PCN reaction that required hospitalization No Has patient had a PCN reaction occurring within the last 10 years: No If all of the above answers are "NO", then may proceed with Cephalosporin use.    VITALS:  Blood pressure 133/72, pulse 84, temperature 98.6 F (37 C), temperature source Oral, resp. rate 20, height 6\' 2"  (1.88 m), weight 111.9 kg (246 lb 11.1 oz), SpO2 99 %.  PHYSICAL EXAMINATION:    Physical Exam  GENERAL:  70 y.o.-year-old patient lying in the bed with no acute distress.  EYES: Pupils equal, round, reactive to light and accommodation. No scleral icterus. Extraocular muscles intact.  HEENT: Head atraumatic, normocephalic. Oropharynx and nasopharynx clear.  NECK:  Supple, no jugular venous distention. No thyroid enlargement, no tenderness.  LUNGS: Normal breath sounds bilaterally, no wheezing, rales, rhonchi. No use of accessory muscles of respiration.  CARDIOVASCULAR: S1, S2 normal. No murmurs, rubs, or gallops.  ABDOMEN: Soft, nontender, nondistended. Bowel sounds present. No organomegaly or mass.  EXTREMITIES: No cyanosis, clubbing or edema b/l.    NEUROLOGIC: Cranial nerves II through XII are intact. No focal Motor or sensory deficits b/l.   PSYCHIATRIC:  patient is alert and oriented x 3.  SKIN: No obvious rash, lesion, or ulcer.   LABORATORY PANEL:  CBC  Recent Labs Lab 11/15/15 0352  WBC 8.7  HGB 12.0*  HCT 36.5*  PLT 168    Chemistries   Recent Labs Lab 11/13/15 0558  NA 143  K 3.7  CL 113*  CO2 23  GLUCOSE 191*  BUN 37*  CREATININE 1.89*  CALCIUM 7.9*  AST 29  ALT 16*  ALKPHOS 58  BILITOT 2.1*   Cardiac Enzymes  Recent Labs Lab 11/11/15 1020  TROPONINI 0.03*   RADIOLOGY:  No results found. ASSESSMENT AND PLAN:    1.Ischemic bowel syndrome ([place]) -s/p angiogram/plasty of occluded branch of SMA POD#4 -feels better -asymptomatic however vomited last pm -IV Cipro + flagyl (per surgery)  2.  Dilated cardiomyopathy (HCC) Chronic systolic CHF (congestive heart failure) (HCC) -stable.Pt not in HF -cont cardiac meds  3.  Type II diabetes mellitus with complication (HCC) -cont home meds and started on levemir 20 units bid and Aspart 8 units tid  4.CKD-III Takes a lot of lasix at home (80 mg in and and 40 mg in pm)  Reduced to 40 mg daily Check BMP today (vomited a liter last nite)  5.  HTN -stable  6  Chronic atrial  fibrillation (HCC) -resumed xarelto and discussed compliance -pt was on heparin gtt due to #1  Case discussed with Care Management/Social Worker. Management plans discussed with the patient and  in agreement.  CODE STATUS:full  DVT Prophylaxis: xarelto  TOTAL TIME TAKING CARE OF THIS PATIENT: 25 minutes.  >50% time spent on counselling and coordination of care pt and wife  POSSIBLE D/C IN 1-2DAYS, DEPENDING ON CLINICAL CONDITION.  Note: This dictation was prepared with Dragon dictation along with smaller phrase technology. Any transcriptional errors that result from this process are unintentional.  Darice Vicario M.D on 11/15/2015 at 7:55 AM  Between 7am to 6pm - Pager - [phone]  After 6pm go to [email or web address] - password [email or web address] [facility]  [place] [facility]  Office  [phone]  CC: Primary care physician; No primary care provider on file.

## 2015-11-15 NOTE — Op Note (Deleted)
, °

## 2015-11-15 NOTE — Progress Notes (Signed)
Hypoglycemic Event  CBG: 61 at 1638  Treatment: 1 4oz. Of apple juice was given to pt  Symptoms: drowsiness   Follow-up CBG: Time: 1719 CBG Result: 73  Possible Reasons for Event: poor intake     Angus Seller

## 2015-11-15 NOTE — Progress Notes (Addendum)
MD aware of pt having 4 beat runs of VTACH. No new orders given. Will continue to monitor pt.   Angus Seller

## 2015-11-15 NOTE — Progress Notes (Signed)
Vomited a liter of green watery emesis overnight X1; Zofran given Ellamae Sia, RN 7:32 AM 11/15/2015

## 2015-11-15 NOTE — Progress Notes (Signed)
    Patient is rate-controlled Afib on tele. Has had BM with flatus. Tolerating PO. Ok to resume Xarelto at this time. This has been ordered by IM. Will need cardiology follow up.

## 2015-11-16 ENCOUNTER — Inpatient Hospital Stay: Payer: Medicare Other

## 2015-11-16 LAB — GLUCOSE, CAPILLARY
GLUCOSE-CAPILLARY: 68 mg/dL (ref 65–99)
GLUCOSE-CAPILLARY: 56 mg/dL — AB (ref 65–99)
GLUCOSE-CAPILLARY: 60 mg/dL — AB (ref 65–99)
GLUCOSE-CAPILLARY: 144 mg/dL — AB (ref 65–99)
Glucose-Capillary: 58 mg/dL — ABNORMAL LOW (ref 65–99)
Glucose-Capillary: 63 mg/dL — ABNORMAL LOW (ref 65–99)
Glucose-Capillary: 68 mg/dL (ref 65–99)
Glucose-Capillary: 67 mg/dL (ref 65–99)
Glucose-Capillary: 127 mg/dL — ABNORMAL HIGH (ref 65–99)

## 2015-11-16 LAB — CBC
HEMATOCRIT: 40.7 % (ref 40.0–52.0)
Hemoglobin: 13.3 g/dL (ref 13.0–18.0)
MCH: 27.5 pg (ref 26.0–34.0)
MCHC: 32.7 g/dL (ref 32.0–36.0)
MCV: 84.3 fL (ref 80.0–100.0)
Platelets: 185 10*3/uL (ref 150–440)
RBC: 4.82 MIL/uL (ref 4.40–5.90)
RDW: 18 % — ABNORMAL HIGH (ref 11.5–14.5)
WBC: 7.9 10*3/uL (ref 3.8–10.6)

## 2015-11-16 LAB — CREATININE, SERUM
CREATININE: 1.34 mg/dL — AB (ref 0.61–1.24)
GFR calc Af Amer: >60 mL/min (ref >60)
GFR calc non Af Amer: 53 mL/min — ABNORMAL LOW (ref >60)

## 2015-11-16 LAB — APTT: APTT: 42 s — AB (ref 24–36)

## 2015-11-16 LAB — HEPARIN LEVEL (UNFRACTIONATED)

## 2015-11-16 LAB — PROTIME-INR
INR: 2.78
PROTHROMBIN TIME: 29.9 s — AB (ref 11.4–15.2)

## 2015-11-16 MED ORDER — SODIUM CHLORIDE 0.9 % IV SOLN: INTRAVENOUS | Status: DC

## 2015-11-16 MED ORDER — HEPARIN (PORCINE) IN NACL 100-0.45 UNIT/ML-% IJ SOLN
900.0000 [IU]/h | INTRAMUSCULAR | Status: DC
  Administered 2015-11-16: 700 [IU]/h via INTRAVENOUS
  Filled 2015-11-16 (×2): qty 250

## 2015-11-16 MED ORDER — DEXTROSE-NACL 5-0.9 % IV SOLN
INTRAVENOUS | Status: DC
  Administered 2015-11-16 – 2015-11-17 (×4): via INTRAVENOUS

## 2015-11-16 MED ORDER — INSULIN DETEMIR 100 UNIT/ML ~~LOC~~ SOLN
10.0000 [IU] | Freq: Two times a day (BID) | SUBCUTANEOUS | Status: DC
  Filled 2015-11-16: qty 0.1

## 2015-11-16 MED ORDER — CARVEDILOL 6.25 MG PO TABS
6.2500 mg | ORAL_TABLET | Freq: Two times a day (BID) | ORAL | Status: DC
Start: 2015-11-16 — End: 2015-11-18
  Administered 2015-11-16 – 2015-11-18 (×4): 6.25 mg via ORAL
  Filled 2015-11-16 (×4): qty 1

## 2015-11-16 MED ORDER — SODIUM CHLORIDE 0.9 % IV BOLUS (SEPSIS): 1000.0000 mL | Freq: Once | INTRAVENOUS | Status: DC

## 2015-11-16 MED ORDER — SODIUM CHLORIDE 0.9 % IV BOLUS (SEPSIS)
1000.0000 mL | Freq: Once | INTRAVENOUS | Status: AC
  Administered 2015-11-16: 1000 mL via INTRAVENOUS

## 2015-11-16 NOTE — Progress Notes (Signed)
Inpatient Diabetes Program Recommendations  AACE/ADA: New Consensus Statement on Inpatient Glycemic Control (2015)  Target Ranges:  Prepandial:   less than 140 mg/dL      Peak postprandial:   less than 180 mg/dL (1-2 hours)      Critically ill patients:  140 - 180 mg/dL  Results for Alex Vance, Alex Vance (MRN [id number]) as of 11/16/2015 11:59  Ref. Range 11/16/2015 05:26 11/16/2015 05:44 11/16/2015 07:42 11/16/2015 08:11 11/16/2015 08:43 11/16/2015 09:29 11/16/2015 11:21  Glucose-Capillary Latest Ref Range: 65 - 99 mg/dL 58 (L) 68 56 (L) 63 (L) 60 (L) 68 67  Results for Alex Vance, Alex Vance (MRN [id number]) as of 11/16/2015 11:59  Ref. Range 11/15/2015 07:38 11/15/2015 11:28 11/15/2015 16:38 11/15/2015 17:19 11/15/2015 19:20 11/15/2015 19:44 11/15/2015 21:17  Glucose-Capillary Latest Ref Range: 65 - 99 mg/dL 121 (H) 91 61 (L) 73 64 (L) 72 75    Review of Glycemic Control  Diabetes history: DM2 Outpatient Diabetes medications: Levemir 65 units BID, Metformin 1000 mg BID, Victoza 1.8 mg daily Current orders for Inpatient glycemic control: Levemir 10 units BID, Novolog 8 units TID with meals, Novolog 0-15 units ACHS  Inpatient Diabetes Program Recommendations: Insulin - Basal: Patient experiencing recurrent hypoglycemia for last 2 days and Levemir has been decreased today from 20 units BID to 10 units BID. Insulin - Meal Coverage: Note patient has poor PO intake and had NG tube placed today with sufficient amount of output. May want to consider decreasing meal coverage. NURSING: please be sure patient eats at least 50% of meal before giving meal coverage (per protocol would hold meal coverage if pre-meal glucose less than 80 mg/dl, if patient is NPO, or if patient eats less than 50% of meal).  Thanks, Barnie Alderman, RN, MSN, CDE Diabetes Coordinator Inpatient Diabetes Program [phone] (Team Pager from [place] to [place]) [phone] ([facility] office) [phone] [facility] office) [phone] [facility] office)

## 2015-11-16 NOTE — Care Management Important Message (Signed)
Important Message  Patient Details  Name: Alex Vance MRN: [id number] Date of Birth: 1946-01-28   Medicare Important Message Given:  Yes    Beverly Sessions, RN 11/16/2015, 11:38 AM

## 2015-11-16 NOTE — Progress Notes (Signed)
Dr.Diamond notified of sustained tachycardia 110-130's; acknowledged; new orders written. Barbaraann Faster, RN 6:31 AM 11/16/2015

## 2015-11-16 NOTE — Progress Notes (Signed)
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 Penicillins Rash    Has patient had a PCN reaction causing immediate rash, facial/tongue/throat swelling, SOB or lightheadedness with hypotension: Yes Has patient had a PCN reaction causing severe rash involving mucus membranes or skin necrosis: No Has patient had a PCN reaction that required hospitalization No Has patient had a PCN reaction occurring within the last 10 years: No If all of the above answers are "NO", then may proceed with Cephalosporin use.    Patient Measurements: Height: 6\' 2"  (188 cm) Weight: 246 lb 11.1 oz (111.9 kg) IBW/kg (Calculated) : 82.2 Heparin Dosing Weight: 104  Vital Signs: Temp: 98.2 F (36.8 C) (07/28 2023) Temp Source: Oral (07/28 2023) BP: 121/72 (07/28 2023) Pulse Rate: 92 (07/28 2023)  Labs:  Recent Labs  11/14/15 0435 11/14/15 1157 11/15/15 0352 11/16/15 0416 11/16/15 1938  HGB 11.1*  --  12.0* 13.3  --   HCT 33.7*  --  36.5* 40.7  --   PLT 157  --  168 185  --   APTT  --  37*  --   --  42*  LABPROT  --   --   --   --  29.9*  INR  --   --   --   --  2.78  HEPARINUNFRC >3.60*  --   --   --   --   CREATININE  --   --  1.23 1.34*  --     Estimated Creatinine Clearance: 71.2 mL/min (by C-G formula based on SCr of 1.34 mg/dL).   Medical History: Past Medical History:  Diagnosis Date  . Chronic atrial fibrillation (HCC)    a. on Xarelto  . Diabetes mellitus without complication ([place])   . Hyperlipemia   . Hypertension   . Nonischemic cardiomyopathy [facility])    a. s/p [employer] ICD placement 2008, generator change 11/2013  b. Echo 2015: EF 30-35%    Assessment: 70 yo male who was transitioned back to Xarelto on 11/14/15. Had NG tube placed today and was found to have ileus. Dr. Azalee Course consulted pharmacy for transition back to heparin.    Patient was administered Xarelto today at 1719, however MD does not believe it will be absorbed.   Goal of Therapy:  Heparin level 0.3-0.7 units/ml aPTT 68-109 seconds Monitor platelets by anticoagulation protocol: Yes   Plan:  Will need to use APTT until goal 66-102 and until HL and APTT corollate.   Patient was previous on heparin 1400 units/hr.   Will start patient on half the dose with no bolus due to Xarelto given tonight.  Heparin 700units/hr ordered, with  anti-Xa and APTT ordered after 6 hours.   Continue to monitor H& H and platelets    Nancy Fetter, PharmD Clinical Pharmacist 11/16/2015 9:07 PM

## 2015-11-16 NOTE — Progress Notes (Signed)
[place] at [place] NAME: Alex Vance    MR#:  [id number]  DATE OF BIRTH:  05-05-1945  SUBJECTIVE:  CHIEF COMPLAINT:   Chief Complaint  Patient presents with  . Dizziness  . Abdominal Pain   - Patient with known A. fib, noncompliant with Xarelto presented with mesenteric ischemic area. -Status post vascular procedure, angioplasty on admission. - had abdominal distention and NG tube placed today and almost 2 L of greenish fluid suctioned. -Sugars have been low today  REVIEW OF SYSTEMS:  Review of Systems  Constitutional: Positive for malaise/fatigue. Negative for chills and fever.  HENT: Negative for ear discharge, ear pain and nosebleeds.   Eyes: Negative for blurred vision and double vision.  Respiratory: Negative for cough, shortness of breath and wheezing.   Cardiovascular: Negative for chest pain and palpitations.  Gastrointestinal: Positive for abdominal pain, nausea and vomiting. Negative for constipation and diarrhea.       Abdominal distention  Genitourinary: Negative for dysuria.  Neurological: Negative for dizziness, seizures and headaches.  Psychiatric/Behavioral: Negative for depression.    DRUG ALLERGIES:   Allergies  Allergen Reactions  . Penicillins Rash    Has patient had a PCN reaction causing immediate rash, facial/tongue/throat swelling, SOB or lightheadedness with hypotension: Yes Has patient had a PCN reaction causing severe rash involving mucus membranes or skin necrosis: No Has patient had a PCN reaction that required hospitalization No Has patient had a PCN reaction occurring within the last 10 years: No If all of the above answers are "NO", then may proceed with Cephalosporin use.    VITALS:  Blood pressure (!) 123/98, pulse (!) 116, temperature 98 F (36.7 C), temperature source Oral, resp. rate 20, height 6\' 2"  (1.88 m), weight 111.9 kg (246 lb 11.1 oz), SpO2 100 %.  PHYSICAL  EXAMINATION:  Physical Exam  GENERAL:  70 y.o.-year-old patient lying in the bed with no acute distress.  EYES: Pupils equal, round, reactive to light and accommodation. No scleral icterus. Extraocular muscles intact.  HEENT: Head atraumatic, normocephalic. Oropharynx and nasopharynx clear. NG tube in place NECK:  Supple, no jugular venous distention. No thyroid enlargement, no tenderness.  LUNGS: Normal breath sounds bilaterally, no wheezing, rales,rhonchi or crepitation. No use of accessory muscles of respiration.  CARDIOVASCULAR: S1, S2 normal. No murmurs, rubs, or gallops.  ABDOMEN: firm, nontender, distended. Hypoactive bowel sounds. No organomegaly or mass.  EXTREMITIES: No pedal edema, cyanosis, or clubbing.  NEUROLOGIC: Cranial nerves II through XII are intact. Muscle strength 5/5 in all extremities. Sensation intact. Gait not checked.  PSYCHIATRIC: The patient is alert and oriented x 3.  SKIN: No obvious rash, lesion, or ulcer.    LABORATORY PANEL:   CBC  Recent Labs Lab 11/16/15 0416  WBC 7.9  HGB 13.3  HCT 40.7  PLT 185   ------------------------------------------------------------------------------------------------------------------  Chemistries   Recent Labs Lab 11/13/15 0558 11/15/15 0352 11/16/15 0416  NA 143 144  --   K 3.7 3.2*  --   CL 113* 114*  --   CO2 23 23  --   GLUCOSE 191* 92  --   BUN 37* 17  --   CREATININE 1.89* 1.23 1.34*  CALCIUM 7.9* 8.4*  --   AST 29  --   --   ALT 16*  --   --   ALKPHOS 58  --   --   BILITOT 2.1*  --   --    ------------------------------------------------------------------------------------------------------------------  Cardiac  Enzymes  Recent Labs Lab 11/11/15 1020  TROPONINI 0.03*   ------------------------------------------------------------------------------------------------------------------  RADIOLOGY:  Dg Abd 2 Views  Result Date: 11/16/2015 CLINICAL DATA:  Status post NG tube placement. Small  bowel obstruction. EXAM: ABDOMEN - 2 VIEW COMPARISON:  Plain films the abdomen 11/15/2015. FINDINGS: NG tube is in place with the side-port in the stomach. Marked gaseous distention of small bowel persists without notable change. IMPRESSION: NG tube in good position. No change in small bowel obstruction. Electronically Signed   By: Inge Rise M.D.   On: 11/16/2015 11:00  Dg Abd 2 Views  Result Date: 11/15/2015 CLINICAL DATA:  Pt states lower abd pain. Denies having any problems using the restroom. No previous surgeries EXAM: ABDOMEN - 2 VIEW COMPARISON:  11/12/2015 FINDINGS: Small bowel dilation has worsened since the prior exam. Allowing for magnification, the increase in dilation is approximately 2 cm. There is still air within a nondilated colon. No free air. IMPRESSION: Worsened, fairly high grade, small bowel obstruction.  No free air. Electronically Signed   By: Lajean Manes M.D.   On: 11/15/2015 14:38   EKG:   Orders placed or performed during the hospital encounter of 11/11/15  . ED EKG  . ED EKG  . EKG    ASSESSMENT AND PLAN:   70 year old male with past medical history significant for A. Fib, on compliant with Xarelto, hypertension, diabetes mellitus, neuropathy presents to hospital secondary to mesenteric ischemia.  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 Patient is status post angiogram and angioplasty. -Developed abdominal distention today, NG tube placed and 2 L drained. -Keep nothing by mouth -Continue NG suction -Management per surgical team  #2 chronic systolic CHF-well compensated. -Receiving IV fluids. Watch for worsening of CHF. -Already on Lasix, Aldactone. Also on Coreg and losartan.  #3 type 2 diabetes mellitus-with hypoglycemia. Discontinue Levemir and aspart. Continue sliding scale insulin. Patient is nothing by mouth. Also on D5 fluids today  #4 CK D stage  III-monitor BMP daily. Receiving fluids and also on Lasix  #5 chronic A. fib-on Coreg. Also on Xarelto for anticoagulation.  #6 DVT prophylaxis-on Xarelto    All the records are reviewed and case discussed with Care Management/Social Workerr. Management plans discussed with the patient, family and they are in agreement.  CODE STATUS: Full Code  TOTAL TIME TAKING CARE OF THIS PATIENT: 37 minutes.   POSSIBLE D/C IN 1-2 DAYS, DEPENDING ON CLINICAL CONDITION.   Gladstone Lighter M.D on 11/16/2015 at 2:54 PM  Between 7am to 6pm - Pager - [phone]  After 6pm go to [email or web address] - password [email or web address] [facility]  [place] [facility]  Office  [phone]  CC: Primary care physician; No primary care provider on file.

## 2015-11-16 NOTE — Consult Note (Signed)
ANTICOAGULATION CONSULT NOTE - Initial Consult  Pharmacy Consult for Rivaroxaban Indication: AFib  Allergies  Allergen Reactions  . Penicillins Rash    Has patient had a PCN reaction causing immediate rash, facial/tongue/throat swelling, SOB or lightheadedness with hypotension: Yes Has patient had a PCN reaction causing severe rash involving mucus membranes or skin necrosis: No Has patient had a PCN reaction that required hospitalization No Has patient had a PCN reaction occurring within the last 10 years: No If all of the above answers are "NO", then may proceed with Cephalosporin use.    Patient Measurements: Height: 6\' 2"  (188 cm) Weight: 246 lb 11.1 oz (111.9 kg) IBW/kg (Calculated) : 82.2  Vital Signs: Temp: 98.5 F (36.9 C) (07/28 0427) Temp Source: Oral (07/28 0427) BP: 134/76 (07/28 0427) Pulse Rate: 118 (07/28 0619)  Labs:  Recent Labs  11/13/15 1234  11/14/15 0435 11/14/15 1157 11/15/15 0352 11/16/15 0416  HGB  --   < > 11.1*  --  12.0* 13.3  HCT  --   --  33.7*  --  36.5* 40.7  PLT  --   --  157  --  168 185  APTT  --   --   --  37*  --   --   HEPARINUNFRC 0.56  --  >3.60*  --   --   --   CREATININE  --   --   --   --  1.23 1.34*  < > = values in this interval not displayed.  Estimated Creatinine Clearance: 71.2 mL/min (by C-G formula based on SCr of 1.34 mg/dL).  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 S/p intervention. Pharmacy consulted to dose xarelto.     Plan:   continue rivaroxaban 20 mg PO daily with supper.  Pt will need SCr and CBC q3 days.    Zania Kalisz D Khizar Fiorella 11/16/2015,10:53 AM

## 2015-11-16 NOTE — Progress Notes (Signed)
Patient was c/o of burning or throbbing sensation left lower quad of the abdomen this afternoon. Dr. Delana Meyer was notified and instructed me to notify Dr. Renetta Chalk service. Dr. Azalee Course was made aware and new order was placed. Patient is not on acute distress. Alert and oriented. Tachycardic at times of exertion . Ng tube in placed. Had small BM today.

## 2015-11-16 NOTE — Progress Notes (Signed)
Hypoglycemic event: CBG: 58 at 0530 Treatment: 4oz of OJ, milk and graham cracker Follow up CBG: 68 Possible reason: Didn't eat supper and Poor HS snack Barbaraann Faster, RN 5:45 AM 11/16/2015

## 2015-11-16 NOTE — Progress Notes (Signed)
70 yr old male POD#5 from SMA intervention for embolic occlusion.  Patient still very bloated and nauseated.  He states not passed any flatus.  He denies any abdominal pain, just a feeling of fullness. After discussion with patient and wife he agreed to NG tube placement.  He vomited about 1L during placement and immediately had about 2L of thick green fluid out of NG tube.    Vitals:   11/16/15 0427 11/16/15 0619  BP: 134/76   Pulse: (!) 132 (!) 118  Resp: 20   Temp: 98.5 F (36.9 C)    I/O last 3 completed shifts: In: 1300 [P.O.:720; IV Piggyback:580] Out: 2300 [Urine:1300; Emesis/NG output:1000] Total I/O In: 301 [IV Piggyback:301] Out: 125 [Urine:125]   PE:  Gen: NAD Res: CTAb/L  Cardio: RRR Abd: soft, non-tender, moderately distended Ext: 1+ edema, warm   CBC Latest Ref Rng & Units 11/16/2015 11/15/2015 11/14/2015  WBC 3.8 - 10.6 K/uL 7.9 8.7 6.6  Hemoglobin 13.0 - 18.0 g/dL 13.3 12.0(L) 11.1(L)  Hematocrit 40.0 - 52.0 % 40.7 36.5(L) 33.7(L)  Platelets 150 - 440 K/uL 185 168 157   CMP Latest Ref Rng & Units 11/16/2015 11/15/2015 11/13/2015  Glucose 65 - 99 mg/dL - 92 191(H)  BUN 6 - 20 mg/dL - 17 37(H)  Creatinine 0.61 - 1.24 mg/dL 1.34(H) 1.23 1.89(H)  Sodium 135 - 145 mmol/L - 144 143  Potassium 3.5 - 5.1 mmol/L - 3.2(L) 3.7  Chloride 101 - 111 mmol/L - 114(H) 113(H)  CO2 22 - 32 mmol/L - 23 23  Calcium 8.9 - 10.3 mg/dL - 8.4(L) 7.9(L)  Total Protein 6.5 - 8.1 g/dL - - 6.1(L)  Total Bilirubin 0.3 - 1.2 mg/dL - - 2.1(H)  Alkaline Phos 38 - 126 U/L - - 58  AST 15 - 41 U/L - - 29  ALT 17 - 63 U/L - - 16(L)    A/P:  Postoperative ileus:  Abd xray showed some dilated loops of small bowel but with air in the colon.  NG tube to suction with thick output, encourage ambulation, suppository, Restarting IV FLUIDs at D5 NS at 100cc/hr to replace fluid losses  Afib: rate controlled,xarelto, appreciate cards help  Acute on chronic kidney injury: improving, voiding well  DM  with hyperglycemia: appreciate IM help,levemir decreased

## 2015-11-17 ENCOUNTER — Inpatient Hospital Stay: Payer: Medicare Other

## 2015-11-17 LAB — CBC
HEMATOCRIT: 33.5 % — AB (ref 40.0–52.0)
HEMOGLOBIN: 11.1 g/dL — AB (ref 13.0–18.0)
MCH: 27.8 pg (ref 26.0–34.0)
MCHC: 33.2 g/dL (ref 32.0–36.0)
MCV: 83.8 fL (ref 80.0–100.0)
PLATELETS: 182 10*3/uL (ref 150–440)
RBC: 3.99 MIL/uL — ABNORMAL LOW (ref 4.40–5.90)
RDW: 17.6 % — AB (ref 11.5–14.5)
WBC: 9.5 10*3/uL (ref 3.8–10.6)

## 2015-11-17 LAB — BASIC METABOLIC PANEL
Anion gap: 7 (ref 5–15)
BUN: 17 mg/dL (ref 6–20)
CALCIUM: 8 mg/dL — AB (ref 8.9–10.3)
CO2: 23 mmol/L (ref 22–32)
CREATININE: 1.38 mg/dL — AB (ref 0.61–1.24)
Chloride: 117 mmol/L — ABNORMAL HIGH (ref 101–111)
GFR calc non Af Amer: 51 mL/min — ABNORMAL LOW (ref >60)
GFR, EST AFRICAN AMERICAN: 60 mL/min — AB (ref >60)
Glucose, Bld: 190 mg/dL — ABNORMAL HIGH (ref 65–99)
Potassium: 3.3 mmol/L — ABNORMAL LOW (ref 3.5–5.1)
SODIUM: 147 mmol/L — AB (ref 135–145)

## 2015-11-17 LAB — GLUCOSE, CAPILLARY
GLUCOSE-CAPILLARY: 231 mg/dL — AB (ref 65–99)
GLUCOSE-CAPILLARY: 164 mg/dL — AB (ref 65–99)
GLUCOSE-CAPILLARY: 158 mg/dL — AB (ref 65–99)
Glucose-Capillary: 224 mg/dL — ABNORMAL HIGH (ref 65–99)
Glucose-Capillary: 241 mg/dL — ABNORMAL HIGH (ref 65–99)

## 2015-11-17 LAB — HEPARIN LEVEL (UNFRACTIONATED): Heparin Unfractionated: 3.22 IU/mL — ABNORMAL HIGH (ref 0.30–0.70)

## 2015-11-17 LAB — APTT
APTT: 55 s — AB (ref 24–36)
aPTT: 56 seconds — ABNORMAL HIGH (ref 24–36)
aPTT: 55 seconds — ABNORMAL HIGH (ref 24–36)

## 2015-11-17 MED ORDER — HEPARIN (PORCINE) IN NACL 100-0.45 UNIT/ML-% IJ SOLN
1200.0000 [IU]/h | INTRAMUSCULAR | Status: DC
  Administered 2015-11-17 (×2): 1100 [IU]/h via INTRAVENOUS
  Filled 2015-11-17 (×3): qty 250

## 2015-11-17 MED ORDER — POTASSIUM CHLORIDE 10 MEQ/100ML IV SOLN
10.0000 meq | INTRAVENOUS | Status: AC
  Administered 2015-11-17 (×4): 10 meq via INTRAVENOUS
  Filled 2015-11-17 (×4): qty 100

## 2015-11-17 NOTE — Progress Notes (Signed)
[facility]  Daily Progress Note   Subjective: 6 Days Post-Op: Ultrasound guidance for vascular access right femoral artery, Catheter placement into SMA and into ileocolic branch of SMA from right femoral approach, Aortogram and selective SMA angiogram, Catheter directed thrombolysis with 4 mg of TPA to the superior mesenteric artery and ileocolic branch of the SMA, Mechanical rheolytic thrombectomy to the SMA and the ileocolic branch of the SMA with the AngioJet proxy catheter, Percutaneous transluminal angioplasty of the superior mesenteric artery in its mid to distal segment with 5 mm diameter by 6 cm length angioplasty balloon, Percutaneous transluminal angioplasty of the ileocolic branch of the SMA with 3 mm diameter by 8 cm length angioplasty balloon with StarClose closure device right femoral artery.  Patient without complaint this AM. Had NG tube placed for ileus.   Objective: Vitals:   11/16/15 2023 11/17/15 0445 11/17/15 0955 11/17/15 1314  BP: 121/72 130/85 (!) 124/59 137/76  Pulse: 92 87 87 (!) 58  Resp: 20 20  16   Temp: 98.2 F (36.8 C) 98.3 F (36.8 C)  98 F (36.7 C)  TempSrc: Oral Oral  Oral  SpO2: 99% 100%  95%  Weight:      Height:        Intake/Output Summary (Last 24 hours) at 11/17/15 1335 Last data filed at 11/17/15 [id number]  Gross per 24 hour  Intake          2794.33 ml  Output             2025 ml  Net           769.33 ml   Physical Exam: A&Ox3, NAD CV: Irregular Pulmonary: CTA Bilaterally Abdomen: Soft, Nontender, Mild distention. NG tube intact: sumping, bilious drainage Vascular: bilateral lower extremities warm, nontender    Laboratory: CBC    Component Value Date/Time   WBC 9.5 11/17/2015 1051   HGB 11.1 (L) 11/17/2015 1051   HCT 33.5 (L) 11/17/2015 1051   PLT 182 11/17/2015 1051   BMET    Component Value Date/Time   NA 147 (H) 11/17/2015 0357   K 3.3 (L) 11/17/2015 0357   CL 117 (H) 11/17/2015 0357   CO2 23  11/17/2015 0357   GLUCOSE 190 (H) 11/17/2015 0357   BUN 17 11/17/2015 0357   CREATININE 1.38 (H) 11/17/2015 0357   CALCIUM 8.0 (L) 11/17/2015 0357   GFRNONAA 51 (L) 11/17/2015 0357   GFRAA 60 (L) 11/17/2015 0357   Assessment/Planning: 70 year old male s/p mesenteric angiogram six days ago now with ileus. 1) Care as per medicine and general surgery. We will follow. 2) If repeat CTA of A/P will provide vascular surgery recommendations. 3) Discussed with Dr. Eber Hong Kalyiah Saintil PA-C 11/17/2015 1:35 PM

## 2015-11-17 NOTE — Progress Notes (Signed)
[facility] at [place] NAME: Alex Vance    MR#:  [id number]  DATE OF BIRTH:  04-04-1946  SUBJECTIVE:  CHIEF COMPLAINT:   Chief Complaint  Patient presents with  . Dizziness  . Abdominal Pain   - Patient with known A. fib, noncompliant with Xarelto presented with mesenteric ischemic area. -Status post vascular procedure, angioplasty on admission. - NG tube placed for bowel obstruction- still draining greenish bile- about 3 L drained yesterday - Feels better, on heparin drip now - small BM today  REVIEW OF SYSTEMS:  Review of Systems  Constitutional: Positive for malaise/fatigue. Negative for chills and fever.  HENT: Negative for ear discharge, ear pain and nosebleeds.   Eyes: Negative for blurred vision and double vision.  Respiratory: Negative for cough, shortness of breath and wheezing.   Cardiovascular: Negative for chest pain and palpitations.  Gastrointestinal: Positive for abdominal pain. Negative for constipation, diarrhea, nausea and vomiting.       Abdominal distention  Genitourinary: Negative for dysuria.  Neurological: Negative for dizziness, seizures and headaches.  Psychiatric/Behavioral: Negative for depression.    DRUG ALLERGIES:   Allergies  Allergen Reactions  . Penicillins Rash    Has patient had a PCN reaction causing immediate rash, facial/tongue/throat swelling, SOB or lightheadedness with hypotension: Yes Has patient had a PCN reaction causing severe rash involving mucus membranes or skin necrosis: No Has patient had a PCN reaction that required hospitalization No Has patient had a PCN reaction occurring within the last 10 years: No If all of the above answers are "NO", then may proceed with Cephalosporin use.    VITALS:  Blood pressure (!) 124/59, pulse 87, temperature 98.3 F (36.8 C), temperature source Oral, resp. rate 20, height 6\' 2"  (1.88 m), weight 111.9 kg (246 lb 11.1 oz), SpO2 100  %.  PHYSICAL EXAMINATION:  Physical Exam  GENERAL:  70 y.o.-year-old patient lying in the bed with no acute distress.  EYES: Pupils equal, round, reactive to light and accommodation. No scleral icterus. Extraocular muscles intact.  HEENT: Head atraumatic, normocephalic. Oropharynx and nasopharynx clear. NG tube in place- to continuous suction NECK:  Supple, no jugular venous distention. No thyroid enlargement, no tenderness.  LUNGS: Normal breath sounds bilaterally, no wheezing, rales,rhonchi or crepitation. No use of accessory muscles of respiration.  CARDIOVASCULAR: S1, S2 normal. No murmurs, rubs, or gallops.  ABDOMEN:  soft today, , nontender, distended. Hypoactive bowel sounds. No organomegaly or mass.  EXTREMITIES: No pedal edema, cyanosis, or clubbing.  NEUROLOGIC: Cranial nerves II through XII are intact. Muscle strength 5/5 in all extremities. Sensation intact. Gait not checked.  PSYCHIATRIC: The patient is alert and oriented x 3.  SKIN: No obvious rash, lesion, or ulcer.    LABORATORY PANEL:   CBC  Recent Labs Lab 11/16/15 0416  WBC 7.9  HGB 13.3  HCT 40.7  PLT 185   ------------------------------------------------------------------------------------------------------------------  Chemistries   Recent Labs Lab 11/13/15 0558  11/17/15 0357  NA 143  < > 147*  K 3.7  < > 3.3*  CL 113*  < > 117*  CO2 23  < > 23  GLUCOSE 191*  < > 190*  BUN 37*  < > 17  CREATININE 1.89*  < > 1.38*  CALCIUM 7.9*  < > 8.0*  AST 29  --   --   ALT 16*  --   --   ALKPHOS 58  --   --   BILITOT 2.1*  --   --   < > =  values in this interval not displayed. ------------------------------------------------------------------------------------------------------------------  Cardiac Enzymes  Recent Labs Lab 11/11/15 1020  TROPONINI 0.03*   ------------------------------------------------------------------------------------------------------------------  RADIOLOGY:  Dg Abd 1  View  Result Date: 11/17/2015 CLINICAL DATA:  Abdominal distention. EXAM: ABDOMEN - 1 VIEW COMPARISON:  11/16/2015 FINDINGS: NG tube remains present in the proximal to mid stomach. Continued small bowel dilatation compatible with small bowel obstruction. No real change since prior study. No free air or organomegaly. IMPRESSION: Continued small bowel obstruction pattern.  No change. Electronically Signed   By: Rolm Baptise M.D.   On: 11/17/2015 08:13  Dg Abd 2 Views  Result Date: 11/16/2015 CLINICAL DATA:  Status post NG tube placement. Small bowel obstruction. EXAM: ABDOMEN - 2 VIEW COMPARISON:  Plain films the abdomen 11/15/2015. FINDINGS: NG tube is in place with the side-port in the stomach. Marked gaseous distention of small bowel persists without notable change. IMPRESSION: NG tube in good position. No change in small bowel obstruction. Electronically Signed   By: Inge Rise M.D.   On: 11/16/2015 11:00  Dg Abd 2 Views  Result Date: 11/15/2015 CLINICAL DATA:  Pt states lower abd pain. Denies having any problems using the restroom. No previous surgeries EXAM: ABDOMEN - 2 VIEW COMPARISON:  11/12/2015 FINDINGS: Small bowel dilation has worsened since the prior exam. Allowing for magnification, the increase in dilation is approximately 2 cm. There is still air within a nondilated colon. No free air. IMPRESSION: Worsened, fairly high grade, small bowel obstruction.  No free air. Electronically Signed   By: Lajean Manes M.D.   On: 11/15/2015 14:38   EKG:   Orders placed or performed during the hospital encounter of 11/11/15  . ED EKG  . ED EKG  . EKG    ASSESSMENT AND PLAN:   70 year old male with past medical history significant for A. Fib, on compliant with Xarelto, hypertension, diabetes mellitus, neuropathy presents to hospital secondary to mesenteric ischemia.  #1 acute mesenteric ischemia-secondary to embolization from noncompliance with Xarelto -Appreciate vascular and surgical  consults. Patient is status post angiogram and angioplasty. -Now with SBO- s/p NG tube and continuous suction- Keep nothing by mouth -Management per surgical team - continue to monitor  #2 chronic systolic CHF-well compensated. -Receiving IV fluids- decrease rate. Watch for worsening of CHF. -Already on Lasix, Aldactone. Also on Coreg and losartan.  #3 type 2 diabetes mellitus-with hypoglycemia. Discontinue Levemir and aspart. Continue sliding scale insulin. Patient is nothing by mouth. Also on D5 fluids today  #4 CKD stage III-monitor BMP daily. Receiving fluids and also on Lasix  #5 chronic A. fib-on Coreg. On heparin drip for anticoagulation now..  #6 DVT prophylaxis-on heparin drip  #7 Hypokalemia- being replaced, but monitor as on losartan and aldactone    All the records are reviewed and case discussed with Care Management/Social Workerr. Management plans discussed with the patient, family and they are in agreement.  CODE STATUS: Full Code  TOTAL TIME TAKING CARE OF THIS PATIENT: 37 minutes.   POSSIBLE D/C IN 1-2 DAYS, DEPENDING ON CLINICAL CONDITION.   Gladstone Lighter M.D on 11/17/2015 at 11:27 AM  Between 7am to 6pm - Pager - [phone]  After 6pm go to [email or web address] - password [email or web address] [facility]  [place] [facility]  Office  [phone]  CC: Primary care physician; No primary care provider on file.

## 2015-11-17 NOTE — Progress Notes (Addendum)
ANTICOAGULATION CONSULT NOTE - follow up [place] for heparin Indication: A. Fib and superior mesenteric artery occlusion s/p intervention  Allergies  Allergen Reactions  . Penicillins Rash    Has patient had a PCN reaction causing immediate rash, facial/tongue/throat swelling, SOB or lightheadedness with hypotension: Yes Has patient had a PCN reaction causing severe rash involving mucus membranes or skin necrosis: No Has patient had a PCN reaction that required hospitalization No Has patient had a PCN reaction occurring within the last 10 years: No If all of the above answers are "NO", then may proceed with Cephalosporin use.    Patient Measurements: Height: 6\' 2"  (188 cm) Weight: 246 lb 11.1 oz (111.9 kg) IBW/kg (Calculated) : 82.2 Heparin Dosing Weight: 104  Vital Signs: Temp: 98.3 F (36.8 C) (07/29 0445) Temp Source: Oral (07/29 0445) BP: 124/59 (07/29 0955) Pulse Rate: 87 (07/29 0955)  Labs:  Recent Labs  11/15/15 0352 11/16/15 0416 11/16/15 1938 11/17/15 0357 11/17/15 1051  HGB 12.0* 13.3  --   --  11.1*  HCT 36.5* 40.7  --   --  33.5*  PLT 168 185  --   --  182  APTT  --   --  42* 55* 56*  LABPROT  --   --  29.9*  --   --   INR  --   --  2.78  --   --   HEPARINUNFRC  --   --  >3.60* >3.60*  --   CREATININE 1.23 1.34*  --  1.38*  --     Estimated Creatinine Clearance: 69.1 mL/min (by C-G formula based on SCr of 1.38 mg/dL).   Medical History: Past Medical History:  Diagnosis Date  . Chronic atrial fibrillation (HCC)    a. on Xarelto  . Diabetes mellitus without complication ([place])   . Hyperlipemia   . Hypertension   . Nonischemic cardiomyopathy [facility])    a. s/p [employer] ICD placement 2008, generator change 11/2013  b. Echo 2015: EF 30-35%    Assessment: 70 yo male who was transitioned back to Xarelto on 11/14/15. Had NG tube placed today and was found to have ileus. Dr. Azalee Course consulted pharmacy for transition back to  heparin.  (Patient was previous on heparin 1400 units/hr).   Patient was administered Xarelto today at 1719, however MD does not believe it will be absorbed.   Goal of Therapy:  Heparin level 0.3-0.7 units/ml aPTT 68-109 seconds Monitor platelets by anticoagulation protocol: Yes   Plan:   7/28-Will start patient on half the dose with no bolus due to Xarelto given tonight. Heparin 700units/hr ordered, with  anti-Xa and APTT ordered after 6 hours.   7/29 04:00 aPTT 55. Increase rate to 800 units/hr and recheck in 6 hours.  7/29 aPTT at 1051= 56. HL= 3.22.   Will increase Heparin drip rate to 900 units/hr and recheck aPTT in 6hrs at 1830. (once aPTT and Heparin level correlate then transition to using just Heparin level.). Anticipate checking next HL with labs on 7/30.    Chinita Greenland PharmD Clinical Pharmacist 11/17/2015 12:25 PM

## 2015-11-17 NOTE — Progress Notes (Signed)
70 yr old male POD#6 from SMA intervention for embolic occlusion.  Patient had NG tube placed yestserday, still draining today.  He states feeling much better, he did have another BM and states some flatus as well.  He denies any abdominal pain   Vitals:   11/17/15 0955 11/17/15 1314  BP: (!) 124/59 137/76  Pulse: 87 (!) 58  Resp:  16  Temp:  98 F (36.7 C)   I/O last 3 completed shifts: In: 3545.3 [P.O.:360; I.V.:2684.3; NG/GT:200; IV Piggyback:301] Out: 4750 [Urine:1125; Emesis/NG output:3625] No intake/output data recorded.   PE:  Gen: NAD Res: CTAb/L  Cardio: RRR Abd: soft, non-tender, moderately distended Ext: 1+ edema, warm   CBC Latest Ref Rng & Units 11/17/2015 11/16/2015 11/15/2015  WBC 3.8 - 10.6 K/uL 9.5 7.9 8.7  Hemoglobin 13.0 - 18.0 g/dL 11.1(L) 13.3 12.0(L)  Hematocrit 40.0 - 52.0 % 33.5(L) 40.7 36.5(L)  Platelets 150 - 440 K/uL 182 185 168   CMP Latest Ref Rng & Units 11/17/2015 11/16/2015 11/15/2015  Glucose 65 - 99 mg/dL 190(H) - 92  BUN 6 - 20 mg/dL 17 - 17  Creatinine 0.61 - 1.24 mg/dL 1.38(H) 1.34(H) 1.23  Sodium 135 - 145 mmol/L 147(H) - 144  Potassium 3.5 - 5.1 mmol/L 3.3(L) - 3.2(L)  Chloride 101 - 111 mmol/L 117(H) - 114(H)  CO2 22 - 32 mmol/L 23 - 23  Calcium 8.9 - 10.3 mg/dL 8.0(L) - 8.4(L)  Total Protein 6.5 - 8.1 g/dL - - -  Total Bilirubin 0.3 - 1.2 mg/dL - - -  Alkaline Phos 38 - 126 U/L - - -  AST 15 - 41 U/L - - -  ALT 17 - 63 U/L - - -    A/P:  Postoperative ileus:  Abd xray showed some dilated loops of small bowel but with air in the colon.  NG tube to suction with thick output, encourage ambulation, suppository,continue fluids D5NS at  to replace fluid losses  Afib: rate controlled,xarelto, appreciate cards help  Acute on chronic kidney injury: improving, voiding well  DM with hyperglycemia: appreciate IM help,levemir decreased

## 2015-11-17 NOTE — Progress Notes (Signed)
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 Penicillins Rash    Has patient had a PCN reaction causing immediate rash, facial/tongue/throat swelling, SOB or lightheadedness with hypotension: Yes Has patient had a PCN reaction causing severe rash involving mucus membranes or skin necrosis: No Has patient had a PCN reaction that required hospitalization No Has patient had a PCN reaction occurring within the last 10 years: No If all of the above answers are "NO", then may proceed with Cephalosporin use.    Patient Measurements: Height: 6\' 2"  (188 cm) Weight: 246 lb 11.1 oz (111.9 kg) IBW/kg (Calculated) : 82.2 Heparin Dosing Weight: 104  Vital Signs: Temp: 98.3 F (36.8 C) (07/29 0445) Temp Source: Oral (07/29 0445) BP: 130/85 (07/29 0445) Pulse Rate: 87 (07/29 0445)  Labs:  Recent Labs  11/14/15 1157 11/15/15 0352 11/16/15 0416 11/16/15 1938 11/17/15 0357  HGB  --  12.0* 13.3  --   --   HCT  --  36.5* 40.7  --   --   PLT  --  168 185  --   --   APTT 37*  --   --  42* 55*  LABPROT  --   --   --  29.9*  --   INR  --   --   --  2.78  --   HEPARINUNFRC  --   --   --  >3.60*  --   CREATININE  --  1.23 1.34*  --  1.38*    Estimated Creatinine Clearance: 69.1 mL/min (by C-G formula based on SCr of 1.38 mg/dL).   Medical History: Past Medical History:  Diagnosis Date  . Chronic atrial fibrillation (HCC)    a. on Xarelto  . Diabetes mellitus without complication ([place])   . Hyperlipemia   . Hypertension   . Nonischemic cardiomyopathy [facility])    a. s/p [employer] ICD placement 2008, generator change 11/2013  b. Echo 2015: EF 30-35%    Assessment: 70 yo male who was transitioned back to Xarelto on 11/14/15. Had NG tube placed today and was found to have ileus. Dr. Azalee Course consulted pharmacy for transition back to heparin.    Patient was administered Xarelto today at 1719, however MD does not believe it will be absorbed.   Goal of Therapy:  Heparin level 0.3-0.7 units/ml aPTT 68-109 seconds Monitor platelets by anticoagulation protocol: Yes   Plan:  Will need to use APTT until goal 66-102 and until HL and APTT corollate.   Patient was previous on heparin 1400 units/hr.   Will start patient on half the dose with no bolus due to Xarelto given tonight.  Heparin 700units/hr ordered, with  anti-Xa and APTT ordered after 6 hours.   Continue to monitor H& H and platelets   7/29 04:00 aPTT 0.55. Increase rate to 800 units/hr and recheck in 6 hours.   Nancy Fetter, PharmD Clinical Pharmacist 11/17/2015 5:01 AM

## 2015-11-17 NOTE — Progress Notes (Addendum)
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 Penicillins Rash    Has patient had a PCN reaction causing immediate rash, facial/tongue/throat swelling, SOB or lightheadedness with hypotension: Yes Has patient had a PCN reaction causing severe rash involving mucus membranes or skin necrosis: No Has patient had a PCN reaction that required hospitalization No Has patient had a PCN reaction occurring within the last 10 years: No If all of the above answers are "NO", then may proceed with Cephalosporin use.    Patient Measurements: Height: 6\' 2"  (188 cm) Weight: 246 lb 11.1 oz (111.9 kg) IBW/kg (Calculated) : 82.2 Heparin Dosing Weight: 104  Vital Signs: Temp: 98 F (36.7 C) (07/29 1314) Temp Source: Oral (07/29 1314) BP: 137/76 (07/29 1314) Pulse Rate: 58 (07/29 1314)  Labs:  Recent Labs  11/15/15 0352 11/16/15 0416  11/16/15 1938 11/17/15 0357 11/17/15 1051 11/17/15 1826  HGB 12.0* 13.3  --   --   --  11.1*  --   HCT 36.5* 40.7  --   --   --  33.5*  --   PLT 168 185  --   --   --  182  --   APTT  --   --   < > 42* 55* 56* 55*  LABPROT  --   --   --  29.9*  --   --   --   INR  --   --   --  2.78  --   --   --   HEPARINUNFRC  --   --   --  >3.60* >3.60* 3.22*  --   CREATININE 1.23 1.34*  --   --  1.38*  --   --   < > = values in this interval not displayed.  Estimated Creatinine Clearance: 69.1 mL/min (by C-G formula based on SCr of 1.38 mg/dL).   Medical History: Past Medical History:  Diagnosis Date  . Chronic atrial fibrillation (HCC)    a. on Xarelto  . Diabetes mellitus without complication ([place])   . Hyperlipemia   . Hypertension   . Nonischemic cardiomyopathy [facility])    a. s/p [employer] ICD placement 2008, generator change 11/2013  b. Echo 2015: EF 30-35%    Assessment: 70 yo male who was transitioned  back to Xarelto on 11/14/15. Had NG tube placed today and was found to have ileus. Dr. Azalee Course consulted pharmacy for transition back to heparin.  (Patient was previous on heparin 1400 units/hr).   Patient was administered Xarelto today at 1719, however MD does not believe it will be absorbed.   Goal of Therapy:  Heparin level 0.3-0.7 units/ml aPTT 68-109 seconds Monitor platelets by anticoagulation protocol: Yes   Plan:   7/28-Will start patient on half the dose with no bolus due to Xarelto given tonight. Heparin 700units/hr ordered, with  anti-Xa and APTT ordered after 6 hours.   7/29 04:00 aPTT 55. Increase rate to 800 units/hr and recheck in 6 hours.  7/29 aPTT at 1051= 56. HL= 3.22.   Will increase Heparin drip rate to 900 units/hr and recheck aPTT in 6hrs at 1830. (once aPTT and Heparin level correlate then transition to using just Heparin level.). Anticipate checking next HL with labs on 7/30.  7/29 APTT @ 1826 = 55 (subtherapeutic) on heparin infusion of 900 units/hr. Confirmed with RN that there had been no interruptions in  infusion. Will increase to 1100 units/hr per protocol and recheck heparin level and APTT in 6 hours. Can use HL for monitoring once it correlates with APTT.   7/30 0300 aPTT 52, heparin level 1.18. Increased rate to 1200 units/hr. Recheck in 6 hours.  Lenis Noon PharmD Clinical Pharmacist 11/17/2015 8:10 PM

## 2015-11-18 LAB — HEPARIN LEVEL (UNFRACTIONATED)
HEPARIN UNFRACTIONATED: 0.67 [IU]/mL (ref 0.30–0.70)
Heparin Unfractionated: 1.18 IU/mL — ABNORMAL HIGH (ref 0.30–0.70)
Heparin Unfractionated: 0.97 IU/mL — ABNORMAL HIGH (ref 0.30–0.70)

## 2015-11-18 LAB — MAGNESIUM: MAGNESIUM: 2.1 mg/dL (ref 1.7–2.4)

## 2015-11-18 LAB — BASIC METABOLIC PANEL
Anion gap: 5 (ref 5–15)
BUN: 15 mg/dL (ref 6–20)
CALCIUM: 8.2 mg/dL — AB (ref 8.9–10.3)
CHLORIDE: 120 mmol/L — AB (ref 101–111)
CO2: 23 mmol/L (ref 22–32)
CREATININE: 1.27 mg/dL — AB (ref 0.61–1.24)
GFR calc Af Amer: >60 mL/min (ref >60)
GFR calc non Af Amer: 57 mL/min — ABNORMAL LOW (ref >60)
GLUCOSE: 144 mg/dL — AB (ref 65–99)
Potassium: 3.5 mmol/L (ref 3.5–5.1)
Sodium: 148 mmol/L — ABNORMAL HIGH (ref 135–145)

## 2015-11-18 LAB — APTT
APTT: 52 s — AB (ref 24–36)
APTT: 68 s — AB (ref 24–36)
APTT: 63 s — AB (ref 24–36)

## 2015-11-18 LAB — GLUCOSE, CAPILLARY
Glucose-Capillary: 164 mg/dL — ABNORMAL HIGH (ref 65–99)
Glucose-Capillary: 161 mg/dL — ABNORMAL HIGH (ref 65–99)
Glucose-Capillary: 145 mg/dL — ABNORMAL HIGH (ref 65–99)
Glucose-Capillary: 165 mg/dL — ABNORMAL HIGH (ref 65–99)

## 2015-11-18 LAB — CBC
HCT: 34.9 % — ABNORMAL LOW (ref 40.0–52.0)
Hemoglobin: 11.5 g/dL — ABNORMAL LOW (ref 13.0–18.0)
MCH: 27.8 pg (ref 26.0–34.0)
MCHC: 33.1 g/dL (ref 32.0–36.0)
MCV: 84 fL (ref 80.0–100.0)
PLATELETS: 194 10*3/uL (ref 150–440)
RBC: 4.15 MIL/uL — AB (ref 4.40–5.90)
RDW: 17.4 % — AB (ref 11.5–14.5)
WBC: 10.5 10*3/uL (ref 3.8–10.6)

## 2015-11-18 MED ORDER — DEXTROSE-NACL 5-0.45 % IV SOLN
INTRAVENOUS | Status: DC
  Administered 2015-11-18: 1000 mL via INTRAVENOUS
  Administered 2015-11-19 – 2015-11-21 (×3): via INTRAVENOUS

## 2015-11-18 MED ORDER — HEPARIN (PORCINE) IN NACL 100-0.45 UNIT/ML-% IJ SOLN
1700.0000 [IU]/h | INTRAMUSCULAR | Status: DC
  Administered 2015-11-18 (×2): 1500 [IU]/h via INTRAVENOUS
  Administered 2015-11-19 – 2015-11-21 (×3): 1400 [IU]/h via INTRAVENOUS
  Administered 2015-11-21: 1500 [IU]/h via INTRAVENOUS
  Filled 2015-11-18 (×11): qty 250

## 2015-11-18 MED ORDER — HEPARIN (PORCINE) IN NACL 100-0.45 UNIT/ML-% IJ SOLN
1300.0000 [IU]/h | INTRAMUSCULAR | Status: DC
  Administered 2015-11-18: 1300 [IU]/h via INTRAVENOUS
  Filled 2015-11-18 (×2): qty 250

## 2015-11-18 MED ORDER — CARVEDILOL 12.5 MG PO TABS
12.5000 mg | ORAL_TABLET | Freq: Two times a day (BID) | ORAL | Status: DC
  Administered 2015-11-18 – 2015-11-22 (×8): 12.5 mg via ORAL
  Filled 2015-11-18 (×8): qty 1

## 2015-11-18 NOTE — Progress Notes (Signed)
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 Penicillins Rash    Has patient had a PCN reaction causing immediate rash, facial/tongue/throat swelling, SOB or lightheadedness with hypotension: Yes Has patient had a PCN reaction causing severe rash involving mucus membranes or skin necrosis: No Has patient had a PCN reaction that required hospitalization No Has patient had a PCN reaction occurring within the last 10 years: No If all of the above answers are "NO", then may proceed with Cephalosporin use.    Patient Measurements: Height: 6\' 2"  (188 cm) Weight: 246 lb 11.1 oz (111.9 kg) IBW/kg (Calculated) : 82.2 Heparin Dosing Weight: 104  Vital Signs: Temp: 98 F (36.7 C) (07/30 0539) Temp Source: Oral (07/30 0539) BP: 160/80 (07/30 0923) Pulse Rate: 94 (07/30 0539)  Labs:  Recent Labs  11/16/15 0416  11/16/15 1938 11/17/15 0357 11/17/15 1051 11/17/15 1826 11/18/15 0252 11/18/15 0840  HGB 13.3  --   --   --  11.1*  --  11.5*  --   HCT 40.7  --   --   --  33.5*  --  34.9*  --   PLT 185  --   --   --  182  --  194  --   APTT  --   < > 42* 55* 56* 55* 52* 68*  LABPROT  --   --  29.9*  --   --   --   --   --   INR  --   --  2.78  --   --   --   --   --   HEPARINUNFRC  --   < > >3.60* >3.60* 3.22*  --  1.18* 0.97*  CREATININE 1.34*  --   --  1.38*  --   --  1.27*  --   < > = values in this interval not displayed.  Estimated Creatinine Clearance: 75.1 mL/min (by C-G formula based on SCr of 1.27 mg/dL).   Medical History: Past Medical History:  Diagnosis Date  . Chronic atrial fibrillation (HCC)    a. on Xarelto  . Diabetes mellitus without complication ([place])   . Hyperlipemia   . Hypertension   . Nonischemic cardiomyopathy [facility])    a. s/p [employer] ICD placement 2008, generator change 11/2013  b. Echo 2015: EF  30-35%    Assessment: 70 yo male who was transitioned back to Xarelto on 11/14/15. Had NG tube placed today and was found to have ileus. Dr. Azalee Course consulted pharmacy for transition back to heparin.  (Patient was previous on heparin 1400 units/hr).   Patient was administered Xarelto today at 1719, however MD does not believe it will be absorbed.   Goal of Therapy:  Heparin level 0.3-0.7 units/ml aPTT 68-109 seconds Monitor platelets by anticoagulation protocol: Yes   Plan:   7/28-Will start patient on half the dose with no bolus due to Xarelto given tonight. Heparin 700units/hr ordered, with  anti-Xa and APTT ordered after 6 hours.   7/29 04:00 aPTT 55. Increase rate to 800 units/hr and recheck in 6 hours.  7/29 aPTT at 1051= 56. HL= 3.22.   Will increase Heparin drip rate to 900 units/hr and recheck aPTT in 6hrs at 1830. (once aPTT and Heparin level correlate then transition to using just Heparin level.). Anticipate checking next HL with labs on 7/30.  7/29 APTT @ 1826 =  55 (subtherapeutic) on heparin infusion of 900 units/hr. Confirmed with RN that there had been no interruptions in infusion. Will increase to 1100 units/hr per protocol and recheck heparin level and APTT in 6 hours. Can use HL for monitoring once it correlates with APTT.   7/30 0300 aPTT 52, heparin level 1.18. Increased rate to 1200 units/hr. Recheck in 6 hours.  7/30 aPTT at 0840 = 68. HL= 0.97.  Will increase Heparin to 1300 units/hr. REcheck aPTT/HL in 6 hrs at 1630. (once aPTT and Heparin level correlate then transition to using just Heparin level.).  Kahmya Pinkham A PharmD Clinical Pharmacist 11/18/2015 10:03 AM

## 2015-11-18 NOTE — Progress Notes (Signed)
Pt had two incidents of abnormal heart beats this shift, and one on the earlier shift. Pt does have an ICD which he noted is monitored by a company in [place], through a box that he has at home. He questioned if his ICD is being monitored from here. MD notified and it was discussed if Pt can bring this box from  home to insure that his ICD is functioning effectively.

## 2015-11-18 NOTE — Progress Notes (Signed)
[place] [facility] [place] NAME: Alex Vance    MR#:  [id number]  DATE OF BIRTH:  November 01, 1945  SUBJECTIVE:  CHIEF COMPLAINT:   Chief Complaint  Patient presents with  . Dizziness  . Abdominal Pain   - Patient with known A. fib, noncompliant with Xarelto presented with mesenteric ischemic area. -NG tube for bowel obstruction, still draining- but improving this morning - PVCs last ngiht  REVIEW OF SYSTEMS:  Review of Systems  Constitutional: Negative for chills, fever and malaise/fatigue.  HENT: Negative for ear discharge, ear pain and nosebleeds.   Eyes: Negative for blurred vision and double vision.  Respiratory: Negative for cough, shortness of breath and wheezing.   Cardiovascular: Negative for chest pain and palpitations.  Gastrointestinal: Negative for abdominal pain, constipation, diarrhea, nausea and vomiting.  Genitourinary: Negative for dysuria.  Neurological: Negative for dizziness, seizures and headaches.  Psychiatric/Behavioral: Negative for depression.    DRUG ALLERGIES:   Allergies  Allergen Reactions  . Penicillins Rash    Has patient had a PCN reaction causing immediate rash, facial/tongue/throat swelling, SOB or lightheadedness with hypotension: Yes Has patient had a PCN reaction causing severe rash involving mucus membranes or skin necrosis: No Has patient had a PCN reaction that required hospitalization No Has patient had a PCN reaction occurring within the last 10 years: No If all of the above answers are "NO", then may proceed with Cephalosporin use.    VITALS:  Blood pressure (!) 160/80, pulse 94, temperature 98 F (36.7 C), temperature source Oral, resp. rate 19, height 6\' 2"  (1.88 m), weight 111.9 kg (246 lb 11.1 oz), SpO2 97 %.  PHYSICAL EXAMINATION:  Physical Exam  GENERAL:  70 y.o.-year-old patient lying in the bed with no acute distress.  EYES: Pupils equal, round, reactive to light and  accommodation. No scleral icterus. Extraocular muscles intact.  HEENT: Head atraumatic, normocephalic. Oropharynx and nasopharynx clear. NG tube in place- to continuous suction NECK:  Supple, no jugular venous distention. No thyroid enlargement, no tenderness.  LUNGS: Normal breath sounds bilaterally, no wheezing, rales,rhonchi or crepitation. No use of accessory muscles of respiration.  CARDIOVASCULAR: S1, S2 normal. No murmurs, rubs, or gallops.  ABDOMEN:  soft today, , nontender, less distended. Better bowel sounds. No organomegaly or mass.  EXTREMITIES: No pedal edema, cyanosis, or clubbing.  NEUROLOGIC: Cranial nerves II through XII are intact. Muscle strength 5/5 in all extremities. Sensation intact. Gait not checked.  PSYCHIATRIC: The patient is alert and oriented x 3.  SKIN: No obvious rash, lesion, or ulcer.    LABORATORY PANEL:   CBC  Recent Labs Lab 11/18/15 0252  WBC 10.5  HGB 11.5*  HCT 34.9*  PLT 194   ------------------------------------------------------------------------------------------------------------------  Chemistries   Recent Labs Lab 11/13/15 0558  11/18/15 0252  NA 143  < > 148*  K 3.7  < > 3.5  CL 113*  < > 120*  CO2 23  < > 23  GLUCOSE 191*  < > 144*  BUN 37*  < > 15  CREATININE 1.89*  < > 1.27*  CALCIUM 7.9*  < > 8.2*  AST 29  --   --   ALT 16*  --   --   ALKPHOS 58  --   --   BILITOT 2.1*  --   --   < > = values in this interval not displayed. ------------------------------------------------------------------------------------------------------------------  Cardiac Enzymes No results for input(s): TROPONINI in the last 168 hours. ------------------------------------------------------------------------------------------------------------------  RADIOLOGY:  Dg Abd 1 View  Result Date: 11/17/2015 CLINICAL DATA:  Abdominal distention. EXAM: ABDOMEN - 1 VIEW COMPARISON:  11/16/2015 FINDINGS: NG tube remains present in the proximal to mid  stomach. Continued small bowel dilatation compatible with small bowel obstruction. No real change since prior study. No free air or organomegaly. IMPRESSION: Continued small bowel obstruction pattern.  No change. Electronically Signed   By: Rolm Baptise M.D.   On: 11/17/2015 08:13  Dg Abd 2 Views  Result Date: 11/16/2015 CLINICAL DATA:  Status post NG tube placement. Small bowel obstruction. EXAM: ABDOMEN - 2 VIEW COMPARISON:  Plain films the abdomen 11/15/2015. FINDINGS: NG tube is in place with the side-port in the stomach. Marked gaseous distention of small bowel persists without notable change. IMPRESSION: NG tube in good position. No change in small bowel obstruction. Electronically Signed   By: Inge Rise M.D.   On: 11/16/2015 11:00   EKG:   Orders placed or performed during the hospital encounter of 11/11/15  . ED EKG  . ED EKG  . EKG    ASSESSMENT AND PLAN:   70 year old male with past medical history significant for A. Fib, on compliant with Xarelto, hypertension, diabetes mellitus, neuropathy presents to hospital secondary to mesenteric ischemia.  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  -Patient is status post angiogram and angioplasty. -Now with SBO- s/p NG tube and continuous suction- Keep nothing by mouth - slight decrease in NG output today, encourage ambulation, abdomen is more soft now. -Management per surgical team - continue to monitor  #2 chronic systolic CHF-well compensated. -Receiving IV fluids- decrease rate. Watch for worsening of CHF. -on Lasix, Aldactone. Also on Coreg and losartan.  #3 type 2 diabetes mellitus-with hypoglycemia. Discontinue Levemir and aspart. Continue sliding scale insulin. Patient remains  nothing by mouth. Also on D5 fluids at 60cc/hr - Sugars are better since then.  #4 CKD stage III-monitor BMP daily. Creatinine has improved. Receiving  fluids and also on Lasix  #5 chronic A. fib-on Coreg- dose increased, PVCs noted. -check magnesium. On heparin drip for anticoagulation now..  #6 DVT prophylaxis-on heparin drip  #7 Hypokalemia- replaced, but monitor as on losartan and aldactone    All the records are reviewed and case discussed with Care Management/Social Workerr. Management plans discussed with the patient, family and they are in agreement.  CODE STATUS: Full Code  TOTAL TIME TAKING CARE OF THIS PATIENT: 35 minutes.   POSSIBLE D/C IN 1-2 DAYS, DEPENDING ON CLINICAL CONDITION.   Lesieli Bresee M.D on 11/18/2015 at 10:31 AM  Between 7am to 6pm - Pager - [phone]  After 6pm go to [email or web address] - password [email or web address] [facility]  [facility]  Office  [phone]  CC: Primary care physician; No primary care provider on file.

## 2015-11-18 NOTE — Progress Notes (Addendum)
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 Penicillins Rash    Has patient had a PCN reaction causing immediate rash, facial/tongue/throat swelling, SOB or lightheadedness with hypotension: Yes Has patient had a PCN reaction causing severe rash involving mucus membranes or skin necrosis: No Has patient had a PCN reaction that required hospitalization No Has patient had a PCN reaction occurring within the last 10 years: No If all of the above answers are "NO", then may proceed with Cephalosporin use.    Patient Measurements: Height: 6\' 2"  (188 cm) Weight: 246 lb 11.1 oz (111.9 kg) IBW/kg (Calculated) : 82.2 Heparin Dosing Weight: 104  Vital Signs: Temp: 97.7 F (36.5 C) (07/30 1241) Temp Source: Oral (07/30 1241) BP: 136/77 (07/30 1241) Pulse Rate: 84 (07/30 1241)  Labs:  Recent Labs  11/16/15 0416  11/16/15 1938 11/17/15 0357 11/17/15 1051  11/18/15 0252 11/18/15 0840 11/18/15 1610  HGB 13.3  --   --   --  11.1*  --  11.5*  --   --   HCT 40.7  --   --   --  33.5*  --  34.9*  --   --   PLT 185  --   --   --  182  --  194  --   --   APTT  --   < > 42* 55* 56*  < > 52* 68* 63*  LABPROT  --   --  29.9*  --   --   --   --   --   --   INR  --   --  2.78  --   --   --   --   --   --   HEPARINUNFRC  --   < > >3.60* >3.60* 3.22*  --  1.18* 0.97* 0.67  CREATININE 1.34*  --   --  1.38*  --   --  1.27*  --   --   < > = values in this interval not displayed.  Estimated Creatinine Clearance: 75.1 mL/min (by C-G formula based on SCr of 1.27 mg/dL).   Medical History: Past Medical History:  Diagnosis Date  . Chronic atrial fibrillation (HCC)    a. on Xarelto  . Diabetes mellitus without complication ([place])   . Hyperlipemia   . Hypertension   . Nonischemic cardiomyopathy [facility])    a. s/p [employer] ICD placement 2008,  generator change 11/2013  b. Echo 2015: EF 30-35%    Assessment: 70 yo male who was transitioned back to Xarelto on 11/14/15. Had NG tube placed today and was found to have ileus. Dr. Azalee Course consulted pharmacy for transition back to heparin.  (Patient was previous on heparin 1400 units/hr).   Patient was administered Xarelto today at 1719, however MD does not believe it will be absorbed.   Goal of Therapy:  Heparin level 0.3-0.7 units/ml aPTT 68-109 seconds Monitor platelets by anticoagulation protocol: Yes   Plan:   7/28-Will start patient on half the dose with no bolus due to Xarelto given tonight. Heparin 700units/hr ordered, with  anti-Xa and APTT ordered after 6 hours.   7/29 04:00 aPTT 55. Increase rate to 800 units/hr and recheck in 6 hours.  7/29 aPTT at 1051= 56. HL= 3.22.   Will increase Heparin drip rate to 900 units/hr and recheck aPTT in 6hrs at 1830. (once aPTT and Heparin  level correlate then transition to using just Heparin level.). Anticipate checking next HL with labs on 7/30.  7/29 APTT @ 1826 = 55 (subtherapeutic) on heparin infusion of 900 units/hr. Confirmed with RN that there had been no interruptions in infusion. Will increase to 1100 units/hr per protocol and recheck heparin level and APTT in 6 hours. Can use HL for monitoring once it correlates with APTT.   7/30 0300 aPTT 52, heparin level 1.18. Increased rate to 1200 units/hr. Recheck in 6 hours.  7/30 aPTT at 0840 = 68. HL= 0.97.  Will increase Heparin to 1300 units/hr. REcheck aPTT/HL in 6 hrs at 1630. (once aPTT and Heparin level correlate then transition to using just Heparin level.).  7/30 APTT @ 1610 = 63 (subtherapeutic) and HL = 0.67 on infusion rate of 1300 units/hr. Confirmed with RN that there were no interruptions in infusion. Increase rate to 1500 units/hr and check APTT/HL in 6 hours.  7/31 0100 heparin level 0.64, aPTT 110. Decrease rate to 1400 units/hr and recheck aPTT and heparin level in 6  hours. Anticipate that levels should correlate and can follow by heparin level alone thereafter.   Lenis Noon PharmD Clinical Pharmacist 11/18/2015 6:54 PM

## 2015-11-18 NOTE — Progress Notes (Signed)
70 yr old male POD#7 from SMA intervention for embolic occlusion.  Patient states passing good amount of flatus and having BMs.  He states much softer today.  He denies any abdominal pain.  Vitals:   11/18/15 0923 11/18/15 1241  BP: (!) 160/80 136/77  Pulse:  84  Resp:  16  Temp:  97.7 F (36.5 C)   I/O last 3 completed shifts: In: 3957 [P.O.:240; I.V.:3207; NG/GT:510] Out: 5410 [Urine:725; Emesis/NG output:4685] No intake/output data recorded.   PE:  Gen: NAD Res: CTAb/L  Cardio: RRR Abd: soft, non-tender,improved distension Ext: 1+ edema, warm   CBC Latest Ref Rng & Units 11/18/2015 11/17/2015 11/16/2015  WBC 3.8 - 10.6 K/uL 10.5 9.5 7.9  Hemoglobin 13.0 - 18.0 g/dL 11.5(L) 11.1(L) 13.3  Hematocrit 40.0 - 52.0 % 34.9(L) 33.5(L) 40.7  Platelets 150 - 440 K/uL 194 182 185   CMP Latest Ref Rng & Units 11/18/2015 11/17/2015 11/16/2015  Glucose 65 - 99 mg/dL 144(H) 190(H) -  BUN 6 - 20 mg/dL 15 17 -  Creatinine 0.61 - 1.24 mg/dL 1.27(H) 1.38(H) 1.34(H)  Sodium 135 - 145 mmol/L 148(H) 147(H) -  Potassium 3.5 - 5.1 mmol/L 3.5 3.3(L) -  Chloride 101 - 111 mmol/L 120(H) 117(H) -  CO2 22 - 32 mmol/L 23 23 -  Calcium 8.9 - 10.3 mg/dL 8.2(L) 8.0(L) -  Total Protein 6.5 - 8.1 g/dL - - -  Total Bilirubin 0.3 - 1.2 mg/dL - - -  Alkaline Phos 38 - 126 U/L - - -  AST 15 - 41 U/L - - -  ALT 17 - 63 U/L - - -    A/P:  Postoperative ileus:  NG tube still with increased drainage but improving clinically, will remain to suction today and likely can clamp or d/c tomorrow depending on output   Afib: rate controlled,xarelto, appreciate cards help  Acute on chronic kidney injury: improving, voiding well  DM with hyperglycemia: appreciate IM help,levemir decreased

## 2015-11-19 LAB — BASIC METABOLIC PANEL
Anion gap: 6 (ref 5–15)
BUN: 13 mg/dL (ref 6–20)
CHLORIDE: 119 mmol/L — AB (ref 101–111)
CO2: 23 mmol/L (ref 22–32)
Calcium: 8.1 mg/dL — ABNORMAL LOW (ref 8.9–10.3)
Creatinine, Ser: 1.3 mg/dL — ABNORMAL HIGH (ref 0.61–1.24)
GFR calc Af Amer: >60 mL/min (ref >60)
GFR calc non Af Amer: 55 mL/min — ABNORMAL LOW (ref >60)
GLUCOSE: 141 mg/dL — AB (ref 65–99)
POTASSIUM: 3.3 mmol/L — AB (ref 3.5–5.1)
Sodium: 148 mmol/L — ABNORMAL HIGH (ref 135–145)

## 2015-11-19 LAB — CBC
HCT: 33.2 % — ABNORMAL LOW (ref 40.0–52.0)
HEMOGLOBIN: 11 g/dL — AB (ref 13.0–18.0)
MCH: 27.3 pg (ref 26.0–34.0)
MCHC: 33 g/dL (ref 32.0–36.0)
MCV: 82.9 fL (ref 80.0–100.0)
Platelets: 189 10*3/uL (ref 150–440)
RBC: 4.01 MIL/uL — AB (ref 4.40–5.90)
RDW: 17.6 % — ABNORMAL HIGH (ref 11.5–14.5)
WBC: 12.3 10*3/uL — ABNORMAL HIGH (ref 3.8–10.6)

## 2015-11-19 LAB — GLUCOSE, CAPILLARY
GLUCOSE-CAPILLARY: 162 mg/dL — AB (ref 65–99)
GLUCOSE-CAPILLARY: 123 mg/dL — AB (ref 65–99)
Glucose-Capillary: 140 mg/dL — ABNORMAL HIGH (ref 65–99)
Glucose-Capillary: 143 mg/dL — ABNORMAL HIGH (ref 65–99)

## 2015-11-19 LAB — MAGNESIUM: MAGNESIUM: 2 mg/dL (ref 1.7–2.4)

## 2015-11-19 LAB — HEPARIN LEVEL (UNFRACTIONATED)
HEPARIN UNFRACTIONATED: 0.49 [IU]/mL (ref 0.30–0.70)
Heparin Unfractionated: 0.64 IU/mL (ref 0.30–0.70)
Heparin Unfractionated: 0.68 IU/mL (ref 0.30–0.70)

## 2015-11-19 LAB — APTT
APTT: 109 s — AB (ref 24–36)
aPTT: 110 seconds — ABNORMAL HIGH (ref 24–36)

## 2015-11-19 MED ORDER — POTASSIUM CHLORIDE 10 MEQ/100ML IV SOLN
10.0000 meq | INTRAVENOUS | Status: AC
  Administered 2015-11-19 (×4): 10 meq via INTRAVENOUS
  Filled 2015-11-19 (×4): qty 100

## 2015-11-19 NOTE — Care Management Important Message (Signed)
Important Message  Patient Details  Name: Alex Vance MRN: [id number] Date of Birth: [birth year]   Medicare Important Message Given:  Yes    Carles Collet, RN 11/19/2015, 8:06 AM

## 2015-11-19 NOTE — Progress Notes (Signed)
ANTICOAGULATION CONSULT NOTE - follow up [place] for heparin Indication: A. Fib and superior mesenteric artery occlusion s/p intervention  Allergies  Allergen Reactions  . Penicillins Rash    Has patient had a PCN reaction causing immediate rash, facial/tongue/throat swelling, SOB or lightheadedness with hypotension: Yes Has patient had a PCN reaction causing severe rash involving mucus membranes or skin necrosis: No Has patient had a PCN reaction that required hospitalization No Has patient had a PCN reaction occurring within the last 10 years: No If all of the above answers are "NO", then may proceed with Cephalosporin use.    Patient Measurements: Height: 6\' 2"  (188 cm) Weight: 231 lb 1.6 oz (104.8 kg) IBW/kg (Calculated) : 82.2 Heparin Dosing Weight: 104  Vital Signs: Temp: 98.1 F (36.7 C) (07/31 0521) Temp Source: Oral (07/31 0521) BP: 146/80 (07/31 0521) Pulse Rate: 85 (07/31 0521)  Labs:  Recent Labs  11/16/15 1938 11/17/15 0357  11/17/15 1051  11/18/15 0252  11/18/15 1610 11/19/15 0105 11/19/15 0808  HGB  --   --   < > 11.1*  --  11.5*  --   --  11.0*  --   HCT  --   --   --  33.5*  --  34.9*  --   --  33.2*  --   PLT  --   --   --  182  --  194  --   --  189  --   APTT 42* 55*  --  56*  < > 52*  < > 63* 110* 109*  LABPROT 29.9*  --   --   --   --   --   --   --   --   --   INR 2.78  --   --   --   --   --   --   --   --   --   HEPARINUNFRC >3.60* >3.60*  --  3.22*  --  1.18*  < > 0.67 0.64 0.68  CREATININE  --  1.38*  --   --   --  1.27*  --   --  1.30*  --   < > = values in this interval not displayed.  Estimated Creatinine Clearance: 71.1 mL/min (by C-G formula based on SCr of 1.3 mg/dL).   Medical History: Past Medical History:  Diagnosis Date  . Chronic atrial fibrillation (HCC)    a. on Xarelto  . Diabetes mellitus without complication ([place])   . Hyperlipemia   . Hypertension   . Nonischemic cardiomyopathy [facility])    a. s/p  [employer] ICD placement 2008, generator change 11/2013  b. Echo 2015: EF 30-35%    Assessment: 70 yo male who was transitioned back to Xarelto on 11/14/15. Had NG tube placed today and was found to have ileus. Dr. Azalee Course consulted pharmacy for transition back to heparin.  (Patient was previous on heparin 1400 units/hr).   Patient was administered Xarelto today at 1719, however MD does not believe it will be absorbed.   Goal of Therapy:  Heparin level 0.3-0.7 units/ml aPTT 68-109 seconds Monitor platelets by anticoagulation protocol: Yes   Plan:   7/28-Will start patient on half the dose with no bolus due to Xarelto given tonight. Heparin 700units/hr ordered, with  anti-Xa and APTT ordered after 6 hours.   7/29 04:00 aPTT 55. Increase rate to 800 units/hr and recheck in 6 hours.  7/29 aPTT at 1051= 56. HL= 3.22.  Will increase Heparin drip rate to 900 units/hr and recheck aPTT in 6hrs at 1830. (once aPTT and Heparin level correlate then transition to using just Heparin level.). Anticipate checking next HL with labs on 7/30.  7/29 APTT @ 1826 = 55 (subtherapeutic) on heparin infusion of 900 units/hr. Confirmed with RN that there had been no interruptions in infusion. Will increase to 1100 units/hr per protocol and recheck heparin level and APTT in 6 hours. Can use HL for monitoring once it correlates with APTT.   7/30 0300 aPTT 52, heparin level 1.18. Increased rate to 1200 units/hr. Recheck in 6 hours.  7/30 aPTT at 0840 = 68. HL= 0.97.  Will increase Heparin to 1300 units/hr. REcheck aPTT/HL in 6 hrs at 1630. (once aPTT and Heparin level correlate then transition to using just Heparin level.).  7/30 APTT @ 1610 = 63 (subtherapeutic) and HL = 0.67 on infusion rate of 1300 units/hr. Confirmed with RN that there were no interruptions in infusion. Increase rate to 1500 units/hr and check APTT/HL in 6 hours.  7/31 0100 heparin level 0.64, aPTT 110. Decrease rate to 1400 units/hr and  recheck aPTT and heparin level in 6 hours. Anticipate that levels should correlate and can follow by heparin level alone thereafter.  7/31 0800 HL=0.68, aPTT 109 HL and APTT correlate. Continue drip at 1400 units/hr and recheck level in 6 hours.  Ramond Dial PharmD Clinical Pharmacist 11/19/2015 9:17 AM

## 2015-11-19 NOTE — Progress Notes (Addendum)
[facility] [place] NAME: Alex Vance    MR#:  [id number]  DATE OF BIRTH:  June 24, 1945  SUBJECTIVE:  CHIEF COMPLAINT:   Chief Complaint  Patient presents with  . Dizziness  . Abdominal Pain   - Patient with known A. fib, noncompliant with Xarelto presented with mesenteric ischemic area. -NG tube for bowel obstruction, still draining- but improving this morning - PVCs last night  REVIEW OF SYSTEMS:  Review of Systems  Constitutional: Negative for chills, fever and malaise/fatigue.  HENT: Negative for ear discharge, ear pain and nosebleeds.   Eyes: Negative for blurred vision and double vision.  Respiratory: Negative for cough, shortness of breath and wheezing.   Cardiovascular: Negative for chest pain and palpitations.  Gastrointestinal: Negative for abdominal pain, constipation, diarrhea, nausea and vomiting.  Genitourinary: Negative for dysuria.  Neurological: Negative for dizziness, seizures and headaches.  Psychiatric/Behavioral: Negative for depression.    DRUG ALLERGIES:   Allergies  Allergen Reactions  . Penicillins Rash    Has patient had a PCN reaction causing immediate rash, facial/tongue/throat swelling, SOB or lightheadedness with hypotension: Yes Has patient had a PCN reaction causing severe rash involving mucus membranes or skin necrosis: No Has patient had a PCN reaction that required hospitalization No Has patient had a PCN reaction occurring within the last 10 years: No If all of the above answers are "NO", then may proceed with Cephalosporin use.    VITALS:  Blood pressure (!) 146/80, pulse 85, temperature 98.1 F (36.7 C), temperature source Oral, resp. rate 18, height 6\' 2"  (1.88 m), weight 104.8 kg (231 lb 1.6 oz), SpO2 99 %.  PHYSICAL EXAMINATION:  Physical Exam  GENERAL:  70 y.o.-year-old patient lying in the bed with no acute distress.  EYES: Pupils equal, round, reactive to light and  accommodation. No scleral icterus. Extraocular muscles intact.  HEENT: Head atraumatic, normocephalic. Oropharynx and nasopharynx clear. NG tube in place- to continuous suction NECK:  Supple, no jugular venous distention. No thyroid enlargement, no tenderness.  LUNGS: Normal breath sounds bilaterally, no wheezing, rales,rhonchi or crepitation. No use of accessory muscles of respiration.  CARDIOVASCULAR: S1, S2 normal. No murmurs, rubs, or gallops.  ABDOMEN:  soft today, , nontender, less distended. Better bowel sounds. No organomegaly or mass.  EXTREMITIES: No pedal edema, cyanosis, or clubbing.  NEUROLOGIC: Cranial nerves II through XII are intact. Muscle strength 5/5 in all extremities. Sensation intact. Gait not checked.  PSYCHIATRIC: The patient is alert and oriented x 3.  SKIN: No obvious rash, lesion, or ulcer.    LABORATORY PANEL:   CBC  Recent Labs Lab 11/19/15 0105  WBC 12.3*  HGB 11.0*  HCT 33.2*  PLT 189   ------------------------------------------------------------------------------------------------------------------  Chemistries   Recent Labs Lab 11/13/15 0558  11/18/15 0252 11/19/15 0105  NA 143  < > 148* 148*  K 3.7  < > 3.5 3.3*  CL 113*  < > 120* 119*  CO2 23  < > 23 23  GLUCOSE 191*  < > 144* 141*  BUN 37*  < > 15 13  CREATININE 1.89*  < > 1.27* 1.30*  CALCIUM 7.9*  < > 8.2* 8.1*  MG  --   --  2.1  --   AST 29  --   --   --   ALT 16*  --   --   --   ALKPHOS 58  --   --   --   BILITOT 2.1*  --   --   --   < > =  values in this interval not displayed. ------------------------------------------------------------------------------------------------------------------  Cardiac Enzymes No results for input(s): TROPONINI in the last 168 hours. ------------------------------------------------------------------------------------------------------------------  RADIOLOGY:  No results found.  EKG:   Orders placed or performed during the hospital  encounter of 11/11/15  . ED EKG  . ED EKG  . EKG    ASSESSMENT AND PLAN:   70 year old male with past medical history significant for A. Fib, on compliant with Xarelto, hypertension, diabetes mellitus, neuropathy presents to hospital secondary to mesenteric ischemia.  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  -Patient is status post angiogram and angioplasty. -Now with SBO- s/p NG tube and continuous suction- Keep nothing by mouth - slight decrease in NG output today, encourage ambulation, abdomen is more soft now. -Management per surgical team - continue to monitor  #2 chronic systolic CHF-well compensated. -Receiving IV fluids- decrease rate. Watch for worsening of CHF. -on Lasix, Aldactone. Also on Coreg and losartan.  #3 type 2 diabetes mellitus-with hypoglycemia. Discontinue Levemir and aspart. Continue sliding scale insulin. Patient remains  nothing by mouth. Also on D5 fluids at 60cc/hr - Sugars are better since then.  #4 CKD stage III-monitor BMP daily. Creatinine has improved. Receiving fluids and also on Lasix  #5 chronic A. fib-on Coreg- dose increased, PVCs noted. NSVT last night, check magnesium level. Potassium being replaced. -check magnesium. On heparin drip for anticoagulation now..  #6 DVT prophylaxis-on heparin drip  #7 Hypokalemia- replaced, but monitor as on losartan and aldactone    All the records are reviewed and case discussed with Care Management/Social Workerr. Management plans discussed with the patient, family and they are in agreement.  CODE STATUS: Full Code  TOTAL TIME TAKING CARE OF THIS PATIENT: 35 minutes.   POSSIBLE D/C IN 1-2 DAYS, DEPENDING ON CLINICAL CONDITION.   Gladstone Lighter M.D on 11/19/2015 at 10:13 AM  Between 7am to 6pm - Pager - [phone]  After 6pm go to [email or web address] - password [email or web address] [facility]  [place] [facility]   Office  [phone]  CC: Primary care physician; No primary care provider on file.

## 2015-11-19 NOTE — Progress Notes (Signed)
Initial Nutrition Assessment  DOCUMENTATION CODES:   Severe malnutrition in context of acute illness/injury  INTERVENTION:  -Monitor diet progression and tolerance - Once diet progressed recommend adding boost breeze TID (clear liquid supplement).     NUTRITION DIAGNOSIS:   Malnutrition related to altered GI function as evidenced by percent weight loss, energy intake < or equal to 50% for > or equal to 5 days.    GOAL:   Patient will meet greater than or equal to 90% of their needs    MONITOR:   Diet advancement  REASON FOR ASSESSMENT:   Malnutrition Screening Tool, NPO/Clear Liquid Diet    ASSESSMENT:      Pt admitted with dizziness, abdominal pain, mesenteria ischemia s/p angiogram and angioplasty.  Pt now with SBO with NG tube hooked to suction  Past Medical History:  Diagnosis Date  . Chronic atrial fibrillation (HCC)    a. on Xarelto  . Diabetes mellitus without complication ([place])   . Hyperlipemia   . Hypertension   . Nonischemic cardiomyopathy [facility])    a. s/p [employer] ICD placement 2008, generator change 11/2013  b. Echo 2015: EF 30-35%   Pt reports normal intake prior to admission, eating 3 meals per day.  Limited intake day 8 of admission (mostly NPO/CL)  Medications reviewed: lasix, aspart, D5 1/2 NS at 19ml/hr Labs reviewed: Na 148, K 3.3, creatinine 1.30, glucose 141  NG tube with 1770ml output  Nutrition-Focused physical exam completed. Findings are no fat depletion, no muscle depletion, and mild edema.     Diet Order:  Diet NPO time specified Except for: Ice Chips, Sips with Meds  Skin:  Reviewed, no issues  Last BM:  7/29  Height:   Ht Readings from Last 1 Encounters:  11/11/15 6\' 2"  (1.88 m)    Weight: 4% wt loss in the last 8 days per chart  Pt reports stable wt prior to admission  Wt Readings from Last 1 Encounters:  11/19/15 231 lb 1.6 oz (104.8 kg)    Ideal Body Weight:     BMI:  Body mass index is 29.67  kg/m.  Estimated Nutritional Needs:   Kcal:  [id number] kcals/d  Protein:  104-125 g/d  Fluid:  >/= 2 L/d  EDUCATION NEEDS:   No education needs identified at this time  Severo Beber B. Zenia Resides, [place], [place] (pager) Weekend/On-Call pager [phone])

## 2015-11-19 NOTE — Progress Notes (Signed)
CC: Ischemic bowel Subjective: 70 year old male admitted with ischemic bowel that appears to responded to vascular surgery intervention. Patient is without any complaints today except for his NG tube. He denies any abdominal pain, nausea. He has been passing flatus.  Objective: Vital signs in last 24 hours: Temp:  [97.6 F (36.4 C)-98.1 F (36.7 C)] 97.6 F (36.4 C) (07/31 1300) Pulse Rate:  [82-85] 82 (07/31 1300) Resp:  [17-19] 17 (07/31 1300) BP: (99-162)/(57-102) 162/82 (07/31 1300) SpO2:  [99 %-100 %] 100 % (07/31 1300) Weight:  [104.8 kg (231 lb 1.6 oz)] 104.8 kg (231 lb 1.6 oz) (07/31 0413) Last BM Date: 11/17/15  Intake/Output from previous day: 07/30 0701 - 07/31 0700 In: [id number] [P.O.:150; I.V.:1173; NG/GT:60] Out: 2800 [Urine:650; Emesis/NG output:2150] Intake/Output this shift: Total I/O In: 685.8 [I.V.:255.8; NG/GT:30; IV Piggyback:400] Out: 250 [Emesis/NG output:250]  Physical exam:  Gen.: No acute distress Chest: Clear to auscultation Heart: Regular rate and rhythm Abdomen: Soft, nontender, nondistended  Lab Results: CBC   Recent Labs  11/18/15 0252 11/19/15 0105  WBC 10.5 12.3*  HGB 11.5* 11.0*  HCT 34.9* 33.2*  PLT 194 189   BMET  Recent Labs  11/18/15 0252 11/19/15 0105  NA 148* 148*  K 3.5 3.3*  CL 120* 119*  CO2 23 23  GLUCOSE 144* 141*  BUN 15 13  CREATININE 1.27* 1.30*  CALCIUM 8.2* 8.1*   PT/INR  Recent Labs  11/16/15 1938  LABPROT 29.9*  INR 2.78   ABG No results for input(s): PHART, HCO3 in the last 72 hours.  Invalid input(s): PCO2, PO2  Studies/Results: No results found.  Anti-infectives: Anti-infectives    Start     Dose/Rate Route Frequency Ordered Stop   11/12/15 0200  ciprofloxacin (CIPRO) IVPB 400 mg  Status:  Discontinued     400 mg 200 mL/hr over 60 Minutes Intravenous Every 12 hours 11/11/15 1658 11/15/15 1248   11/12/15 0000  metroNIDAZOLE (FLAGYL) IVPB 500 mg  Status:  Discontinued     500 mg 100  mL/hr over 60 Minutes Intravenous Every 8 hours 11/11/15 1658 11/15/15 1248   11/11/15 1828  ciprofloxacin (CIPRO) 400 MG/200ML IVPB    Comments:  Sharen Heck: cabinet override      11/11/15 1828 11/12/15 0629   11/11/15 1615  ciprofloxacin (CIPRO) IVPB 400 mg     400 mg 200 mL/hr over 60 Minutes Intravenous  Once 11/11/15 1600 11/11/15 1715   11/11/15 1600  metroNIDAZOLE (FLAGYL) IVPB 500 mg     500 mg 100 mL/hr over 60 Minutes Intravenous  Once 11/11/15 1600 11/11/15 1710      Assessment/Plan:  70 year old male admitted for ischemic bowel that responded to vascular surgery endovascular intervention. Abdomen is completely benign. He has been taking by mouth yet continues to have NG tube output. We will attempt an NG tube clamp trial today. Should he pass the clamp trial we would then remove his NG tube at that time. Discussed with the patient the importance of ambulation. He also voiced understanding that should he have any return of nausea he is to have his NG tube returned to suction prior to vomiting. Should he not resolve his ileus soon we may need to repeat cross-sectional imaging.  Amberlie Gaillard T. Adonis Huguenin, MD, FACS  11/19/2015

## 2015-11-19 NOTE — Progress Notes (Addendum)
ANTICOAGULATION CONSULT NOTE - follow up [place] for heparin Indication: A. Fib and superior mesenteric artery occlusion s/p intervention  Allergies  Allergen Reactions  . Penicillins Rash    Has patient had a PCN reaction causing immediate rash, facial/tongue/throat swelling, SOB or lightheadedness with hypotension: Yes Has patient had a PCN reaction causing severe rash involving mucus membranes or skin necrosis: No Has patient had a PCN reaction that required hospitalization No Has patient had a PCN reaction occurring within the last 10 years: No If all of the above answers are "NO", then may proceed with Cephalosporin use.    Patient Measurements: Height: 6\' 2"  (188 cm) Weight: 231 lb 1.6 oz (104.8 kg) IBW/kg (Calculated) : 82.2 Heparin Dosing Weight: 104  Vital Signs: Temp: 97.6 F (36.4 C) (07/31 1300) Temp Source: Oral (07/31 1300) BP: 162/82 (07/31 1300) Pulse Rate: 82 (07/31 1300)  Labs:  Recent Labs  11/16/15 1938 11/17/15 0357  11/17/15 1051  11/18/15 0252  11/18/15 1610 11/19/15 0105 11/19/15 0808 11/19/15 1401  HGB  --   --   < > 11.1*  --  11.5*  --   --  11.0*  --   --   HCT  --   --   --  33.5*  --  34.9*  --   --  33.2*  --   --   PLT  --   --   --  182  --  194  --   --  189  --   --   APTT 42* 55*  --  56*  < > 52*  < > 63* 110* 109*  --   LABPROT 29.9*  --   --   --   --   --   --   --   --   --   --   INR 2.78  --   --   --   --   --   --   --   --   --   --   HEPARINUNFRC >3.60* >3.60*  --  3.22*  --  1.18*  < > 0.67 0.64 0.68 0.49  CREATININE  --  1.38*  --   --   --  1.27*  --   --  1.30*  --   --   < > = values in this interval not displayed.  Estimated Creatinine Clearance: 71.1 mL/min (by C-G formula based on SCr of 1.3 mg/dL).   Medical History: Past Medical History:  Diagnosis Date  . Chronic atrial fibrillation (HCC)    a. on Xarelto  . Diabetes mellitus without complication ([place])   . Hyperlipemia   .  Hypertension   . Nonischemic cardiomyopathy [facility])    a. s/p [employer] ICD placement 2008, generator change 11/2013  b. Echo 2015: EF 30-35%    Assessment: 70 yo male who was transitioned back to Xarelto on 11/14/15. Had NG tube placed today and was found to have ileus. Dr. Azalee Course consulted pharmacy for transition back to heparin.  (Patient was previous on heparin 1400 units/hr).   Patient was administered Xarelto today at 1719, however MD does not believe it will be absorbed.   Goal of Therapy:  Heparin level 0.3-0.7 units/ml aPTT 68-109 seconds Monitor platelets by anticoagulation protocol: Yes   Plan:   7/28-Will start patient on half the dose with no bolus due to Xarelto given tonight. Heparin 700units/hr ordered, with  anti-Xa and APTT ordered after 6 hours.  7/29 04:00 aPTT 55. Increase rate to 800 units/hr and recheck in 6 hours.  7/29 aPTT at 1051= 56. HL= 3.22.   Will increase Heparin drip rate to 900 units/hr and recheck aPTT in 6hrs at 1830. (once aPTT and Heparin level correlate then transition to using just Heparin level.). Anticipate checking next HL with labs on 7/30.  7/29 APTT @ 1826 = 55 (subtherapeutic) on heparin infusion of 900 units/hr. Confirmed with RN that there had been no interruptions in infusion. Will increase to 1100 units/hr per protocol and recheck heparin level and APTT in 6 hours. Can use HL for monitoring once it correlates with APTT.   7/30 0300 aPTT 52, heparin level 1.18. Increased rate to 1200 units/hr. Recheck in 6 hours.  7/30 aPTT at 0840 = 68. HL= 0.97.  Will increase Heparin to 1300 units/hr. REcheck aPTT/HL in 6 hrs at 1630. (once aPTT and Heparin level correlate then transition to using just Heparin level.).  7/30 APTT @ 1610 = 63 (subtherapeutic) and HL = 0.67 on infusion rate of 1300 units/hr. Confirmed with RN that there were no interruptions in infusion. Increase rate to 1500 units/hr and check APTT/HL in 6 hours.  7/31 0100  heparin level 0.64, aPTT 110. Decrease rate to 1400 units/hr and recheck aPTT and heparin level in 6 hours. Anticipate that levels should correlate and can follow by heparin level alone thereafter.  7/31 0800 HL=0.68, aPTT 109 HL and APTT correlate. Continue drip at 1400 units/hr and recheck level in 6 hours.  7/31 1435     HL=0.49, will continue drip at 1400 units/hr and recheck level with am labs.   08/01 0508 HL 0.43. Continue current rate. Pharmacy will continue to monitor daily HL and CBC. -[place] PharmD Clinical Pharmacist 11/19/2015 3:06 PM

## 2015-11-20 DIAGNOSIS — E43 Unspecified severe protein-calorie malnutrition: Secondary | ICD-10-CM | POA: Insufficient documentation

## 2015-11-20 LAB — GLUCOSE, CAPILLARY
GLUCOSE-CAPILLARY: 278 mg/dL — AB (ref 65–99)
GLUCOSE-CAPILLARY: 155 mg/dL — AB (ref 65–99)
GLUCOSE-CAPILLARY: 173 mg/dL — AB (ref 65–99)
Glucose-Capillary: 136 mg/dL — ABNORMAL HIGH (ref 65–99)
Glucose-Capillary: 120 mg/dL — ABNORMAL HIGH (ref 65–99)

## 2015-11-20 LAB — BASIC METABOLIC PANEL
ANION GAP: 7 (ref 5–15)
BUN: 11 mg/dL (ref 6–20)
CO2: 21 mmol/L — AB (ref 22–32)
Calcium: 8 mg/dL — ABNORMAL LOW (ref 8.9–10.3)
Chloride: 117 mmol/L — ABNORMAL HIGH (ref 101–111)
Creatinine, Ser: 1.25 mg/dL — ABNORMAL HIGH (ref 0.61–1.24)
GFR calc Af Amer: >60 mL/min (ref >60)
GFR, EST NON AFRICAN AMERICAN: 58 mL/min — AB (ref >60)
GLUCOSE: 146 mg/dL — AB (ref 65–99)
POTASSIUM: 3.3 mmol/L — AB (ref 3.5–5.1)
Sodium: 145 mmol/L (ref 135–145)

## 2015-11-20 LAB — MAGNESIUM: Magnesium: 1.8 mg/dL (ref 1.7–2.4)

## 2015-11-20 LAB — HEPARIN LEVEL (UNFRACTIONATED): HEPARIN UNFRACTIONATED: 0.43 [IU]/mL (ref 0.30–0.70)

## 2015-11-20 MED ORDER — POTASSIUM CHLORIDE 10 MEQ/100ML IV SOLN
10.0000 meq | INTRAVENOUS | Status: AC
  Administered 2015-11-20 (×4): 10 meq via INTRAVENOUS
  Filled 2015-11-20 (×4): qty 100

## 2015-11-20 MED ORDER — ACETAMINOPHEN 325 MG PO TABS: 650.0000 mg | ORAL_TABLET | Freq: Four times a day (QID) | ORAL | Status: DC | PRN

## 2015-11-20 MED ORDER — POTASSIUM CHLORIDE CRYS ER 20 MEQ PO TBCR
30.0000 meq | EXTENDED_RELEASE_TABLET | Freq: Two times a day (BID) | ORAL | Status: DC
  Administered 2015-11-20: 30 meq via ORAL
  Filled 2015-11-20: qty 1

## 2015-11-20 NOTE — Plan of Care (Signed)
Problem: Safety: Goal: Ability to remain free from injury will improve Outcome: Progressing Pt has remained free from falls during my care. Pt has had a steady gait and has been taken off fall precautions.   Problem: Pain Managment: Goal: General experience of comfort will improve Outcome: Progressing Pt has remained pain free during my care.   Problem: Nutrition: Goal: Adequate nutrition will be maintained Outcome: Progressing Pt has not complained of nausea or vomiting during my care and has had his diet reinstated to clears.

## 2015-11-20 NOTE — Progress Notes (Signed)
[facility] [place] NAME: Alex Vance    MR#:  [id number]  DATE OF BIRTH:  Sep 15, 1945  SUBJECTIVE:  CHIEF COMPLAINT:   Chief Complaint  Patient presents with  . Dizziness  . Abdominal Pain   - Patient with known A. fib, noncompliant with Xarelto presented with mesenteric ischemic area. -NG tube removed as drainage improved  REVIEW OF SYSTEMS:  Review of Systems  Constitutional: Negative for chills, fever and malaise/fatigue.  HENT: Negative for ear discharge, ear pain and nosebleeds.   Eyes: Negative for blurred vision and double vision.  Respiratory: Negative for cough, shortness of breath and wheezing.   Cardiovascular: Negative for chest pain and palpitations.  Gastrointestinal: Negative for abdominal pain, constipation, diarrhea, nausea and vomiting.  Genitourinary: Negative for dysuria.  Neurological: Negative for dizziness, seizures and headaches.  Psychiatric/Behavioral: Negative for depression.    DRUG ALLERGIES:   Allergies  Allergen Reactions  . Penicillins Rash    Has patient had a PCN reaction causing immediate rash, facial/tongue/throat swelling, SOB or lightheadedness with hypotension: Yes Has patient had a PCN reaction causing severe rash involving mucus membranes or skin necrosis: No Has patient had a PCN reaction that required hospitalization No Has patient had a PCN reaction occurring within the last 10 years: No If all of the above answers are "NO", then may proceed with Cephalosporin use.    VITALS:  Blood pressure 140/70, pulse 71, temperature 98.1 F (36.7 C), temperature source Oral, resp. rate 17, height 6\' 2"  (1.88 m), weight 104.8 kg (231 lb 1.6 oz), SpO2 100 %.  PHYSICAL EXAMINATION:  Physical Exam  GENERAL:  70 y.o.-year-old patient lying in the bed with no acute distress.  EYES: Pupils equal, round, reactive to light and accommodation. No scleral icterus. Extraocular muscles intact.   HEENT: Head atraumatic, normocephalic. Oropharynx and nasopharynx clear. NG tube in place- to continuous suction NECK:  Supple, no jugular venous distention. No thyroid enlargement, no tenderness.  LUNGS: Normal breath sounds bilaterally, no wheezing, rales,rhonchi or crepitation. No use of accessory muscles of respiration.  CARDIOVASCULAR: S1, S2 normal. No murmurs, rubs, or gallops.  ABDOMEN:  soft today, , nontender, less distended. Better bowel sounds. No organomegaly or mass.  EXTREMITIES: No pedal edema, cyanosis, or clubbing.  NEUROLOGIC: Cranial nerves II through XII are intact. Muscle strength 5/5 in all extremities. Sensation intact. Gait not checked.  PSYCHIATRIC: The patient is alert and oriented x 3.  SKIN: No obvious rash, lesion, or ulcer.    LABORATORY PANEL:   CBC  Recent Labs Lab 11/19/15 0105  WBC 12.3*  HGB 11.0*  HCT 33.2*  PLT 189   ------------------------------------------------------------------------------------------------------------------  Chemistries   Recent Labs Lab 11/20/15 0508  NA 145  K 3.3*  CL 117*  CO2 21*  GLUCOSE 146*  BUN 11  CREATININE 1.25*  CALCIUM 8.0*  MG 1.8   ------------------------------------------------------------------------------------------------------------------  Cardiac Enzymes No results for input(s): TROPONINI in the last 168 hours. ------------------------------------------------------------------------------------------------------------------  RADIOLOGY:  No results found.  EKG:   Orders placed or performed during the hospital encounter of 11/11/15  . ED EKG  . ED EKG  . EKG    ASSESSMENT AND PLAN:   70 year old male with past medical history significant for A. Fib, on compliant with Xarelto, hypertension, diabetes mellitus, neuropathy presents to hospital secondary to mesenteric ischemia.  #1 acute mesenteric ischemia-secondary to embolization from noncompliance with Xarelto -Appreciate  vascular and surgical consults.  -Patient is status post  angiogram and angioplasty. -Now with SBO- s/p NG tube and continuous suction- Improved NG drainage and NG removed today - Likely clears later today, abdomen is soft, passing flatus and also having small BMs -Management per surgical team - continue to monitor  #2 chronic systolic CHF-well compensated. -Receiving IV fluids- decrease rate. Watch for worsening of CHF. -on Lasix, Aldactone. Also on Coreg and losartan.  #3 type 2 diabetes mellitus-with hypoglycemia. Discontinue Levemir and aspart. Continue sliding scale insulin. Patient remains  nothing by mouth. Also on D5 fluids at 60cc/hr - Sugars are better since then.  #4 CKD stage III-monitor BMP daily. Creatinine has improved. Receiving fluids and also on Lasix  #5 chronic A. fib-on Coreg- dose increased, PVCs noted. chronic. Potassium being replaced. -On heparin drip for anticoagulation now. Change to xarelto once able to take PO.  #6 DVT prophylaxis-on heparin drip- change to xarelto once started taking orals  #7 Hypokalemia- replaced, but monitor as on losartan and aldactone    All the records are reviewed and case discussed with Care Management/Social Workerr. Management plans discussed with the patient, family and they are in agreement.  CODE STATUS: Full Code  TOTAL TIME TAKING CARE OF THIS PATIENT: 35 minutes.   POSSIBLE D/C TOMORROW, DEPENDING ON CLINICAL CONDITION.   Gladstone Lighter M.D on 11/20/2015 at 3:01 PM  Between 7am to 6pm - Pager - [phone]  After 6pm go to [email or web address] - password [email or web address] [facility]  [place] [facility]  Office  [phone]  CC: Primary care physician; No primary care provider on file.

## 2015-11-20 NOTE — Progress Notes (Signed)
CC: [place] Subjective: Patient continue with NG tube clamped overnight. He denies any nausea. He denies any abdominal pain. He is hungry and has been passing flatus.  Objective: Vital signs in last 24 hours: Temp:  [97.4 F (36.3 C)-98.1 F (36.7 C)] 98.1 F (36.7 C) (08/01 0422) Pulse Rate:  [72-82] 72 (08/01 0422) Resp:  [15-17] 16 (08/01 0422) BP: (132-162)/(78-82) 132/78 (08/01 0422) SpO2:  [98 %-100 %] 98 % (08/01 0422) Last BM Date: 11/17/15  Intake/Output from previous day: 07/31 0701 - 08/01 0700 In: 1597.2 [I.V.:1167.2; NG/GT:30; IV Piggyback:400] Out: 1025 [Urine:775; Emesis/NG output:250] Intake/Output this shift: Total I/O In: 513.1 [I.V.:513.1] Out: -   Physical exam:  Gen.: No acute distress Chest: Clear to auscultation Heart: Regular rhythm Abdomen: Soft and nontender  Lab Results: CBC   Recent Labs  11/18/15 0252 11/19/15 0105  WBC 10.5 12.3*  HGB 11.5* 11.0*  HCT 34.9* 33.2*  PLT 194 189   BMET  Recent Labs  11/19/15 0105 11/20/15 0508  NA 148* 145  K 3.3* 3.3*  CL 119* 117*  CO2 23 21*  GLUCOSE 141* 146*  BUN 13 11  CREATININE 1.30* 1.25*  CALCIUM 8.1* 8.0*   PT/INR No results for input(s): LABPROT, INR in the last 72 hours. ABG No results for input(s): PHART, HCO3 in the last 72 hours.  Invalid input(s): PCO2, PO2  Studies/Results: No results found.  Anti-infectives: Anti-infectives    Start     Dose/Rate Route Frequency Ordered Stop   11/12/15 0200  ciprofloxacin (CIPRO) IVPB 400 mg  Status:  Discontinued     400 mg 200 mL/hr over 60 Minutes Intravenous Every 12 hours 11/11/15 1658 11/15/15 1248   11/12/15 0000  metroNIDAZOLE (FLAGYL) IVPB 500 mg  Status:  Discontinued     500 mg 100 mL/hr over 60 Minutes Intravenous Every 8 hours 11/11/15 1658 11/15/15 1248   11/11/15 1828  ciprofloxacin (CIPRO) 400 MG/200ML IVPB    Comments:  Sharen Heck: cabinet override      11/11/15 1828 11/12/15 0629   11/11/15 1615   ciprofloxacin (CIPRO) IVPB 400 mg     400 mg 200 mL/hr over 60 Minutes Intravenous  Once 11/11/15 1600 11/11/15 1715   11/11/15 1600  metroNIDAZOLE (FLAGYL) IVPB 500 mg     500 mg 100 mL/hr over 60 Minutes Intravenous  Once 11/11/15 1600 11/11/15 1710      Assessment/Plan:  70 year old male with recent ischemic bowel that responded to intravascular treatment. NG tube was clamped overnight. When re-hooked up to suction this morning less than 100 mL of output were obtained. Plan to remove NG tube this morning. Keep nothing by mouth until later today. If does well without his NG tube we will then start him on clear liquid diet. Encourage ambulation and incentive spirometry usage.  Early Steel T. Adonis Huguenin, MD, FACS  11/20/2015

## 2015-11-21 ENCOUNTER — Inpatient Hospital Stay: Payer: Medicare Other

## 2015-11-21 LAB — CBC
HCT: 35.1 % — ABNORMAL LOW (ref 40.0–52.0)
HCT: 33.8 % — ABNORMAL LOW (ref 40.0–52.0)
Hemoglobin: 11.5 g/dL — ABNORMAL LOW (ref 13.0–18.0)
Hemoglobin: 10.9 g/dL — ABNORMAL LOW (ref 13.0–18.0)
MCH: 27.1 pg (ref 26.0–34.0)
MCH: 27 pg (ref 26.0–34.0)
MCHC: 32.7 g/dL (ref 32.0–36.0)
MCHC: 32.3 g/dL (ref 32.0–36.0)
MCV: 82.9 fL (ref 80.0–100.0)
MCV: 83.8 fL (ref 80.0–100.0)
PLATELETS: 198 10*3/uL (ref 150–440)
Platelets: 184 10*3/uL (ref 150–440)
RBC: 4.23 MIL/uL — ABNORMAL LOW (ref 4.40–5.90)
RBC: 4.04 MIL/uL — ABNORMAL LOW (ref 4.40–5.90)
RDW: 17.3 % — AB (ref 11.5–14.5)
RDW: 17.3 % — AB (ref 11.5–14.5)
WBC: 29 10*3/uL — ABNORMAL HIGH (ref 3.8–10.6)
WBC: 37.8 10*3/uL — AB (ref 3.8–10.6)

## 2015-11-21 LAB — GLUCOSE, CAPILLARY
GLUCOSE-CAPILLARY: 186 mg/dL — AB (ref 65–99)
GLUCOSE-CAPILLARY: 156 mg/dL — AB (ref 65–99)
Glucose-Capillary: 190 mg/dL — ABNORMAL HIGH (ref 65–99)
Glucose-Capillary: 169 mg/dL — ABNORMAL HIGH (ref 65–99)

## 2015-11-21 LAB — BASIC METABOLIC PANEL
Anion gap: 11 (ref 5–15)
BUN: 10 mg/dL (ref 6–20)
CALCIUM: 8.2 mg/dL — AB (ref 8.9–10.3)
CO2: 18 mmol/L — AB (ref 22–32)
CREATININE: 1.35 mg/dL — AB (ref 0.61–1.24)
Chloride: 114 mmol/L — ABNORMAL HIGH (ref 101–111)
GFR calc non Af Amer: 53 mL/min — ABNORMAL LOW (ref >60)
Glucose, Bld: 179 mg/dL — ABNORMAL HIGH (ref 65–99)
Potassium: 3.3 mmol/L — ABNORMAL LOW (ref 3.5–5.1)
SODIUM: 143 mmol/L (ref 135–145)

## 2015-11-21 LAB — URINALYSIS COMPLETE WITH MICROSCOPIC (ARMC ONLY)
BACTERIA UA: NONE SEEN
BILIRUBIN URINE: NEGATIVE
GLUCOSE, UA: NEGATIVE mg/dL
LEUKOCYTES UA: NEGATIVE
Nitrite: NEGATIVE
PH: 6 (ref 5.0–8.0)
Protein, ur: 30 mg/dL — AB
SQUAMOUS EPITHELIAL / LPF: NONE SEEN
Specific Gravity, Urine: 1.015 (ref 1.005–1.030)

## 2015-11-21 LAB — HEPARIN LEVEL (UNFRACTIONATED)
HEPARIN UNFRACTIONATED: 0.27 [IU]/mL — AB (ref 0.30–0.70)
HEPARIN UNFRACTIONATED: 0.38 [IU]/mL (ref 0.30–0.70)
Heparin Unfractionated: 0.38 IU/mL (ref 0.30–0.70)

## 2015-11-21 MED ORDER — SODIUM CHLORIDE 0.9 % IV BOLUS (SEPSIS)
500.0000 mL | Freq: Once | INTRAVENOUS | Status: AC
  Administered 2015-11-21: 500 mL via INTRAVENOUS

## 2015-11-21 MED ORDER — POTASSIUM CHLORIDE CRYS ER 20 MEQ PO TBCR
40.0000 meq | EXTENDED_RELEASE_TABLET | Freq: Once | ORAL | Status: AC
  Administered 2015-11-21: 40 meq via ORAL
  Filled 2015-11-21: qty 2

## 2015-11-21 MED ORDER — IOPAMIDOL (ISOVUE-300) INJECTION 61%
100.0000 mL | Freq: Once | INTRAVENOUS | Status: AC | PRN
  Administered 2015-11-21: 100 mL via INTRAVENOUS

## 2015-11-21 MED ORDER — HEPARIN BOLUS VIA INFUSION
1500.0000 [IU] | Freq: Once | INTRAVENOUS | Status: AC
  Administered 2015-11-21: 1500 [IU] via INTRAVENOUS
  Filled 2015-11-21: qty 1500

## 2015-11-21 MED ORDER — CIPROFLOXACIN IN D5W 400 MG/200ML IV SOLN
400.0000 mg | Freq: Two times a day (BID) | INTRAVENOUS | Status: DC
  Administered 2015-11-21 – 2015-11-23 (×4): 400 mg via INTRAVENOUS
  Filled 2015-11-21 (×5): qty 200

## 2015-11-21 MED ORDER — METRONIDAZOLE IN NACL 5-0.79 MG/ML-% IV SOLN
500.0000 mg | Freq: Three times a day (TID) | INTRAVENOUS | Status: DC
Start: 2015-11-21 — End: 2015-11-23
  Administered 2015-11-21 – 2015-11-23 (×6): 500 mg via INTRAVENOUS
  Filled 2015-11-21 (×8): qty 100

## 2015-11-21 MED ORDER — MAGNESIUM SULFATE 2 GM/50ML IV SOLN
2.0000 g | Freq: Once | INTRAVENOUS | Status: AC
  Administered 2015-11-21: 2 g via INTRAVENOUS
  Filled 2015-11-21: qty 50

## 2015-11-21 MED ORDER — DIATRIZOATE MEGLUMINE & SODIUM 66-10 % PO SOLN
15.0000 mL | ORAL | Status: AC
  Administered 2015-11-21 (×2): 15 mL via ORAL

## 2015-11-21 NOTE — Progress Notes (Signed)
Pt states he has finished second bottle of contrast.

## 2015-11-21 NOTE — Progress Notes (Addendum)
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 Penicillins Rash    Has patient had a PCN reaction causing immediate rash, facial/tongue/throat swelling, SOB or lightheadedness with hypotension: Yes Has patient had a PCN reaction causing severe rash involving mucus membranes or skin necrosis: No Has patient had a PCN reaction that required hospitalization No Has patient had a PCN reaction occurring within the last 10 years: No If all of the above answers are "NO", then may proceed with Cephalosporin use.    Patient Measurements: Height: 6\' 2"  (188 cm) Weight: 231 lb 1.6 oz (104.8 kg) IBW/kg (Calculated) : 82.2 Heparin Dosing Weight: 104  Vital Signs: Temp: 97.7 F (36.5 C) (08/02 2043) Temp Source: Oral (08/02 2043) BP: 95/56 (08/02 2044) Pulse Rate: 72 (08/02 2044)  Labs:  Recent Labs  11/19/15 0105 11/19/15 [id number]  11/20/15 0508 11/21/15 0410 11/21/15 0734 11/21/15 1347 11/21/15 2058  HGB 11.0*  --   --   --  11.5* 10.9*  --   --   HCT 33.2*  --   --   --  35.1* 33.8*  --   --   PLT 189  --   --   --  184 198  --   --   APTT 110* 109*  --   --   --   --   --   --   HEPARINUNFRC 0.64 0.68  < > 0.43 0.27*  --  0.38 0.38  CREATININE 1.30*  --   --  1.25* 1.35*  --   --   --   < > = values in this interval not displayed.  Estimated Creatinine Clearance: 68.5 mL/min (by C-G formula based on SCr of 1.35 mg/dL).   Medical History: Past Medical History:  Diagnosis Date  . Chronic atrial fibrillation (HCC)    a. on Xarelto  . Diabetes mellitus without complication ([place])   . Hyperlipemia   . Hypertension   . Nonischemic cardiomyopathy [facility])    a. s/p [employer] ICD placement 2008, generator change 11/2013  b. Echo 2015: EF 30-35%    Assessment: 70 yo male who was transitioned back to Xarelto on 11/14/15. Had NG tube  placed today and was found to have ileus. Dr. Azalee Course consulted pharmacy for transition back to heparin.  (Patient was previous on heparin 1400 units/hr).   Patient was administered Xarelto today at 1719, however MD does not believe it will be absorbed.   Goal of Therapy:  Heparin level 0.3-0.7 units/ml aPTT 68-109 seconds Monitor platelets by anticoagulation protocol: Yes   Plan:   7/28-Will start patient on half the dose with no bolus due to Xarelto given tonight. Heparin 700units/hr ordered, with  anti-Xa and APTT ordered after 6 hours.   7/29 04:00 aPTT 55. Increase rate to 800 units/hr and recheck in 6 hours.  7/29 aPTT at 1051= 56. HL= 3.22.   Will increase Heparin drip rate to 900 units/hr and recheck aPTT in 6hrs at 1830. (once aPTT and Heparin level correlate then transition to using just Heparin level.). Anticipate checking next HL with labs on 7/30.  7/29 APTT @ 1826 = 55 (subtherapeutic) on heparin infusion of 900 units/hr. Confirmed with RN that there had been no interruptions in infusion. Will increase to 1100 units/hr per protocol and recheck heparin level and APTT in 6 hours. Can use HL for monitoring once it  correlates with APTT.   7/30 0300 aPTT 52, heparin level 1.18. Increased rate to 1200 units/hr. Recheck in 6 hours.  7/30 aPTT at 0840 = 68. HL= 0.97.  Will increase Heparin to 1300 units/hr. REcheck aPTT/HL in 6 hrs at 1630. (once aPTT and Heparin level correlate then transition to using just Heparin level.).  7/30 APTT @ 1610 = 63 (subtherapeutic) and HL = 0.67 on infusion rate of 1300 units/hr. Confirmed with RN that there were no interruptions in infusion. Increase rate to 1500 units/hr and check APTT/HL in 6 hours.  7/31 0100 heparin level 0.64, aPTT 110. Decrease rate to 1400 units/hr and recheck aPTT and heparin level in 6 hours. Anticipate that levels should correlate and can follow by heparin level alone thereafter.  7/31 0800 HL=0.68, aPTT 109 HL and APTT  correlate. Continue drip at 1400 units/hr and recheck level in 6 hours.  7/31 1435     HL=0.49, will continue drip at 1400 units/hr and recheck level with am labs.   08/01 0508 HL 0.43. Continue current rate. Pharmacy will continue to monitor daily HL and CBC. -NAC  0802 0410 HL subtherapeutic. 1500 unit IV x 1 bolus and increase rate to 1500 units/hr. Will recheck heparin level in 6 hours.  0802 @1347  HL is therapeutic @ 0.38. Will recheck heparin level @ 2000. Pharmacy to follow.   0802 :  HL @ 2100 = 0.38.   Will continue this pt at current rate and recheck HL on 8/3 with AM labs.   0803 0545 HL subtherapeutic. 3000 unit IV x 1 bolus and increase rate to 1700 units/hr. Will recheck level in 6 hours. Ebbie Latus, Pharm.D., BCPS  Robbins,Jason D, Pharm.D., BCPS Clinical Pharmacist 11/21/2015 9:57 PM

## 2015-11-21 NOTE — Progress Notes (Signed)
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 Penicillins Rash    Has patient had a PCN reaction causing immediate rash, facial/tongue/throat swelling, SOB or lightheadedness with hypotension: Yes Has patient had a PCN reaction causing severe rash involving mucus membranes or skin necrosis: No Has patient had a PCN reaction that required hospitalization No Has patient had a PCN reaction occurring within the last 10 years: No If all of the above answers are "NO", then may proceed with Cephalosporin use.    Patient Measurements: Height: 6\' 2"  (188 cm) Weight: 231 lb 1.6 oz (104.8 kg) IBW/kg (Calculated) : 82.2 Heparin Dosing Weight: 104  Vital Signs: Temp: 98.2 F (36.8 C) (08/02 0528) Temp Source: Oral (08/02 0528) BP: 122/59 (08/02 0528) Pulse Rate: 105 (08/02 0528)  Labs:  Recent Labs  11/18/15 1610 11/19/15 0105 11/19/15 0808 11/19/15 1401 11/20/15 0508 11/21/15 0410  HGB  --  11.0*  --   --   --  11.5*  HCT  --  33.2*  --   --   --  35.1*  PLT  --  189  --   --   --  184  APTT 63* 110* 109*  --   --   --   HEPARINUNFRC 0.67 0.64 0.68 0.49 0.43 0.27*  CREATININE  --  1.30*  --   --  1.25* 1.35*    Estimated Creatinine Clearance: 68.5 mL/min (by C-G formula based on SCr of 1.35 mg/dL).   Medical History: Past Medical History:  Diagnosis Date  . Chronic atrial fibrillation (HCC)    a. on Xarelto  . Diabetes mellitus without complication ([place])   . Hyperlipemia   . Hypertension   . Nonischemic cardiomyopathy [facility])    a. s/p [employer] ICD placement 2008, generator change 11/2013  b. Echo 2015: EF 30-35%    Assessment: 70 yo male who was transitioned back to Xarelto on 11/14/15. Had NG tube placed today and was found to have ileus. Dr. Azalee Course consulted pharmacy for transition back to heparin.  (Patient was  previous on heparin 1400 units/hr).   Patient was administered Xarelto today at 1719, however MD does not believe it will be absorbed.   Goal of Therapy:  Heparin level 0.3-0.7 units/ml aPTT 68-109 seconds Monitor platelets by anticoagulation protocol: Yes   Plan:   7/28-Will start patient on half the dose with no bolus due to Xarelto given tonight. Heparin 700units/hr ordered, with  anti-Xa and APTT ordered after 6 hours.   7/29 04:00 aPTT 55. Increase rate to 800 units/hr and recheck in 6 hours.  7/29 aPTT at 1051= 56. HL= 3.22.   Will increase Heparin drip rate to 900 units/hr and recheck aPTT in 6hrs at 1830. (once aPTT and Heparin level correlate then transition to using just Heparin level.). Anticipate checking next HL with labs on 7/30.  7/29 APTT @ 1826 = 55 (subtherapeutic) on heparin infusion of 900 units/hr. Confirmed with RN that there had been no interruptions in infusion. Will increase to 1100 units/hr per protocol and recheck heparin level and APTT in 6 hours. Can use HL for monitoring once it correlates with APTT.   7/30 0300 aPTT 52, heparin level 1.18. Increased rate to 1200 units/hr. Recheck in 6 hours.  7/30 aPTT at 0840 = 68. HL= 0.97.  Will increase Heparin to 1300 units/hr. REcheck aPTT/HL in 6 hrs at  1630. (once aPTT and Heparin level correlate then transition to using just Heparin level.).  7/30 APTT @ 1610 = 63 (subtherapeutic) and HL = 0.67 on infusion rate of 1300 units/hr. Confirmed with RN that there were no interruptions in infusion. Increase rate to 1500 units/hr and check APTT/HL in 6 hours.  7/31 0100 heparin level 0.64, aPTT 110. Decrease rate to 1400 units/hr and recheck aPTT and heparin level in 6 hours. Anticipate that levels should correlate and can follow by heparin level alone thereafter.  7/31 0800 HL=0.68, aPTT 109 HL and APTT correlate. Continue drip at 1400 units/hr and recheck level in 6 hours.  7/31 1435     HL=0.49, will continue drip at  1400 units/hr and recheck level with am labs.   08/01 0508 HL 0.43. Continue current rate. Pharmacy will continue to monitor daily HL and CBC. -NAC  0802 0410 HL subtherapeutic. 1500 unit IV x 1 bolus and increase rate to 1500 units/hr. Will recheck heparin level in 6 hours.  Laural Benes, Pharm.D., BCPS Clinical Pharmacist 11/21/2015 6:19 AM

## 2015-11-21 NOTE — Progress Notes (Signed)
BP trending downward, notified [facility], 500cc bolus infusing, will retake BP post bolus. Villa Herb RN-BC

## 2015-11-21 NOTE — Progress Notes (Signed)
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 Penicillins Rash    Has patient had a PCN reaction causing immediate rash, facial/tongue/throat swelling, SOB or lightheadedness with hypotension: Yes Has patient had a PCN reaction causing severe rash involving mucus membranes or skin necrosis: No Has patient had a PCN reaction that required hospitalization No Has patient had a PCN reaction occurring within the last 10 years: No If all of the above answers are "NO", then may proceed with Cephalosporin use.    Patient Measurements: Height: 6\' 2"  (188 cm) Weight: 231 lb 1.6 oz (104.8 kg) IBW/kg (Calculated) : 82.2 Heparin Dosing Weight: 104  Vital Signs: Temp: 98.1 F (36.7 C) (08/02 1100) Temp Source: Oral (08/02 1100) BP: 128/62 (08/02 1100) Pulse Rate: 81 (08/02 1100)  Labs:  Recent Labs  11/18/15 1610  11/19/15 0105 11/19/15 0808  11/20/15 0508 11/21/15 0410 11/21/15 0734 11/21/15 1347  HGB  --   < > 11.0*  --   --   --  11.5* 10.9*  --   HCT  --   --  33.2*  --   --   --  35.1* 33.8*  --   PLT  --   --  189  --   --   --  184 198  --   APTT 63*  --  110* 109*  --   --   --   --   --   HEPARINUNFRC 0.67  --  0.64 0.68  < > 0.43 0.27*  --  0.38  CREATININE  --   --  1.30*  --   --  1.25* 1.35*  --   --   < > = values in this interval not displayed.  Estimated Creatinine Clearance: 68.5 mL/min (by C-G formula based on SCr of 1.35 mg/dL).   Medical History: Past Medical History:  Diagnosis Date  . Chronic atrial fibrillation (HCC)    a. on Xarelto  . Diabetes mellitus without complication ([place])   . Hyperlipemia   . Hypertension   . Nonischemic cardiomyopathy [facility])    a. s/p [employer] ICD placement 2008, generator change 11/2013  b. Echo 2015: EF 30-35%    Assessment: 70 yo male who was transitioned back to  Xarelto on 11/14/15. Had NG tube placed today and was found to have ileus. Dr. Azalee Course consulted pharmacy for transition back to heparin.  (Patient was previous on heparin 1400 units/hr).   Patient was administered Xarelto today at 1719, however MD does not believe it will be absorbed.   Goal of Therapy:  Heparin level 0.3-0.7 units/ml aPTT 68-109 seconds Monitor platelets by anticoagulation protocol: Yes   Plan:   7/28-Will start patient on half the dose with no bolus due to Xarelto given tonight. Heparin 700units/hr ordered, with  anti-Xa and APTT ordered after 6 hours.   7/29 04:00 aPTT 55. Increase rate to 800 units/hr and recheck in 6 hours.  7/29 aPTT at 1051= 56. HL= 3.22.   Will increase Heparin drip rate to 900 units/hr and recheck aPTT in 6hrs at 1830. (once aPTT and Heparin level correlate then transition to using just Heparin level.). Anticipate checking next HL with labs on 7/30.  7/29 APTT @ 1826 = 55 (subtherapeutic) on heparin infusion of 900 units/hr. Confirmed with RN that there had been no interruptions in infusion. Will increase to 1100 units/hr per  protocol and recheck heparin level and APTT in 6 hours. Can use HL for monitoring once it correlates with APTT.   7/30 0300 aPTT 52, heparin level 1.18. Increased rate to 1200 units/hr. Recheck in 6 hours.  7/30 aPTT at 0840 = 68. HL= 0.97.  Will increase Heparin to 1300 units/hr. REcheck aPTT/HL in 6 hrs at 1630. (once aPTT and Heparin level correlate then transition to using just Heparin level.).  7/30 APTT @ 1610 = 63 (subtherapeutic) and HL = 0.67 on infusion rate of 1300 units/hr. Confirmed with RN that there were no interruptions in infusion. Increase rate to 1500 units/hr and check APTT/HL in 6 hours.  7/31 0100 heparin level 0.64, aPTT 110. Decrease rate to 1400 units/hr and recheck aPTT and heparin level in 6 hours. Anticipate that levels should correlate and can follow by heparin level alone thereafter.  7/31 0800  HL=0.68, aPTT 109 HL and APTT correlate. Continue drip at 1400 units/hr and recheck level in 6 hours.  7/31 1435     HL=0.49, will continue drip at 1400 units/hr and recheck level with am labs.   08/01 0508 HL 0.43. Continue current rate. Pharmacy will continue to monitor daily HL and CBC. -NAC  0802 0410 HL subtherapeutic. 1500 unit IV x 1 bolus and increase rate to 1500 units/hr. Will recheck heparin level in 6 hours.  0802 @1347  HL is therapeutic @ 0.38. Will recheck heparin level @ 2000. Pharmacy to follow.   Tyse Auriemma D, Pharm.D., BCPS Clinical Pharmacist 11/21/2015 2:31 PM

## 2015-11-21 NOTE — Progress Notes (Signed)
CC: Ischemic bowel Subjective: Patient without current complaints this morning. He has tolerated removal of his NG tube and the initiation of clear liquids. He denies any nausea or vomiting. He had 2 small bowel movements yesterday. He is not having any abdominal pain. He has ambulated and states he feels much better than previously.  Objective: Vital signs in last 24 hours: Temp:  [98.1 F (36.7 C)-98.4 F (36.9 C)] 98.1 F (36.7 C) (08/02 1100) Pulse Rate:  [81-105] 81 (08/02 1100) Resp:  [15-18] 18 (08/02 1100) BP: (122-137)/(56-73) 128/62 (08/02 1100) SpO2:  [94 %-99 %] 95 % (08/02 1100) Last BM Date: 11/20/15  Intake/Output from previous day: 08/01 0701 - 08/02 0700 In: 1963.6 [P.O.:480; I.V.:1483.6] Out: 2000 [Urine:2000] Intake/Output this shift: Total I/O In: 380 [P.O.:15; I.V.:365] Out: 550 [Urine:550]  Physical exam:  Gen.: No acute distress Chest: Clear to auscultation Heart: Regular rate and rhythm Abdomen: Soft, nontender, nondistended  Lab Results: CBC   Recent Labs  11/21/15 0410 11/21/15 0734  WBC 29.0* 37.8*  HGB 11.5* 10.9*  HCT 35.1* 33.8*  PLT 184 198   BMET  Recent Labs  11/20/15 0508 11/21/15 0410  NA 145 143  K 3.3* 3.3*  CL 117* 114*  CO2 21* 18*  GLUCOSE 146* 179*  BUN 11 10  CREATININE 1.25* 1.35*  CALCIUM 8.0* 8.2*   PT/INR No results for input(s): LABPROT, INR in the last 72 hours. ABG No results for input(s): PHART, HCO3 in the last 72 hours.  Invalid input(s): PCO2, PO2  Studies/Results: Dg Chest 2 View  Result Date: 11/21/2015 CLINICAL DATA:  White blood cell abnormality. Nonischemic cardiomyopathy. Ex-smoker. EXAM: CHEST  2 VIEW COMPARISON:  11/12/2015. FINDINGS: The cardiac silhouette is mildly enlarged with an interval decrease in size. Stable left subclavian pacer and AICD leads. Clear lungs with normal vascularity. Unremarkable bones. IMPRESSION: No acute abnormality. Electronically Signed   By: Claudie Revering M.D.    On: 11/21/2015 13:13   Ct Abdomen Pelvis W Contrast  Result Date: 11/21/2015 CLINICAL DATA:  Generalized abdominal pain a few days. Recent small bowel obstruction. NG tube removal. Elevated white blood cell count. EXAM: CT ABDOMEN AND PELVIS WITH CONTRAST TECHNIQUE: Multidetector CT imaging of the abdomen and pelvis was performed using the standard protocol following bolus administration of intravenous contrast. CONTRAST:  133mL ISOVUE-300 IOPAMIDOL (ISOVUE-300) INJECTION 61% COMPARISON:  11/11/2015 FINDINGS: Lower chest: Lung bases demonstrate mild bibasilar dependent atelectasis. No effusion. Cardiac pacer leads over the heart. Calcified plaque over the coronary arteries. Mild cardiomegaly. Hepatobiliary: There is mild heterogeneous low attenuation over the periphery of the right lobe of the liver more focal on the delayed images without significant change from the prior exam. Gallbladder and biliary tree are within normal. Pancreas: No mass, inflammatory changes, or other significant abnormality. Spleen: Within normal limits in size and appearance. Adrenals/Urinary Tract: Adrenal glands are normal symmetric. Kidneys are normal in size without hydronephrosis or nephrolithiasis. J few small bilateral renal cortical hypodensities too small to characterize but likely cysts and unchanged. Ureters are within normal. Bladder is normal. Stomach/Bowel: Mild gastric distension. Progressive dilatation of several proximal small bowel loops including duodenum and jejunum in the right mid to upper abdomen. 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 No evidence of pneumatosis. There is a whirled appearance to the mesentery and SMA/ SMV unchanged. There is no evidence of a transition point. The dilated small bowel may be due to patient's ischemic episode versus possible internal hernia. Tiny amount  of fluid in the mid mesentery. Appendix is normal in caliber with evidence of an appendicolith distally.  There is moderate diverticulosis of the colon and redundancy of the sigmoid colon. Vascular/Lymphatic: No pathologically enlarged lymph nodes. Few nonspecific small lymph nodes in the gastrohepatic ligament unchanged. No evidence of abdominal aortic aneurysm. There is moderate calcified plaque over the abdominal aorta and iliac arteries. Again noted is interval resolution of the previously seen SMA thrombus. Reproductive: No mass or other significant abnormality. Other: Small umbilical hernia containing peritoneal fat unchanged. Small right inguinal hernia small oval 2.2 cm density over the right inguinal hernia which may represent complex fluid or hemorrhage. Musculoskeletal:  Minimal degenerate change of the spine and hips. IMPRESSION: Interval resolution of previously seen SMA thrombus. Progressive dilatation of the stomach, proximal small bowel including duodenum and proximal to mid jejunal loops in the mid to upper right abdomen. No evidence of pneumatosis. No transition point identified. Findings may be due to patient's ischemic episode versus partial small bowel obstruction due to internal hernia. Stable subtle heterogeneous low attenuation over the right lobe of the liver more focal on the delayed images and not significantly changed. Recommend attention to this area on follow-up as cannot exclude developing abscess in this patient with pain, elevated white blood cell count on recent ischemic episode. Few small sub cm renal cortical hypodensities too small to characterize, but likely cysts. Diverticulosis of the colon. Right inguinal hernia with 2.2 cm oval density which may be due to complex fluid/ hemorrhage. Small nonspecific lymph nodes over the gastrohepatic ligament unchanged. Electronically Signed   By: Marin Olp M.D.   On: 11/21/2015 13:58    Anti-infectives: Anti-infectives    Start     Dose/Rate Route Frequency Ordered Stop   11/21/15 1445  ciprofloxacin (CIPRO) IVPB 400 mg     400  mg 200 mL/hr over 60 Minutes Intravenous Every 12 hours 11/21/15 1444     11/21/15 1445  metroNIDAZOLE (FLAGYL) IVPB 500 mg     500 mg 100 mL/hr over 60 Minutes Intravenous Every 8 hours 11/21/15 1444     11/12/15 0200  ciprofloxacin (CIPRO) IVPB 400 mg  Status:  Discontinued     400 mg 200 mL/hr over 60 Minutes Intravenous Every 12 hours 11/11/15 1658 11/15/15 1248   11/12/15 0000  metroNIDAZOLE (FLAGYL) IVPB 500 mg  Status:  Discontinued     500 mg 100 mL/hr over 60 Minutes Intravenous Every 8 hours 11/11/15 1658 11/15/15 1248   11/11/15 1828  ciprofloxacin (CIPRO) 400 MG/200ML IVPB    Comments:  Sharen Heck: cabinet override      11/11/15 1828 11/12/15 0629   11/11/15 1615  ciprofloxacin (CIPRO) IVPB 400 mg     400 mg 200 mL/hr over 60 Minutes Intravenous  Once 11/11/15 1600 11/11/15 1715   11/11/15 1600  metroNIDAZOLE (FLAGYL) IVPB 500 mg     500 mg 100 mL/hr over 60 Minutes Intravenous  Once 11/11/15 1600 11/11/15 1710      Assessment/Plan:  70 year old male admitted with ischemic bowel. Clinically much improved. He has an unexplained elevation of his white blood cell count. Blood cultures and urine cultures were obtained. CT scan of the abdomen fails to show any intra-abdominal abscess or progression of ischemic bowel. Question raised of infection to the liver but no clear abscess seen. There is no free fluid or free air. Plan will be to re-initiate broad-spectrum antibiotics with ciprofloxacin and Flagyl. Encourage ambulation and incentive from her usage. We will  advance his diet to regular today as he is tolerating his clears and states he is hungry for something more. We will follow up on cultures and repeat labs in the morning. Appreciate internal medicine assistance with this patient  Jolene Schimke. Adonis Huguenin, MD, FACS  11/21/2015

## 2015-11-21 NOTE — Progress Notes (Signed)
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 Penicillins Rash    Has patient had a PCN reaction causing immediate rash, facial/tongue/throat swelling, SOB or lightheadedness with hypotension: Yes Has patient had a PCN reaction causing severe rash involving mucus membranes or skin necrosis: No Has patient had a PCN reaction that required hospitalization No Has patient had a PCN reaction occurring within the last 10 years: No If all of the above answers are "NO", then may proceed with Cephalosporin use.    Patient Measurements: Height: 6\' 2"  (188 cm) Weight: 231 lb 1.6 oz (104.8 kg) IBW/kg (Calculated) : 82.2 Heparin Dosing Weight: 104  Vital Signs: Temp: 98.1 F (36.7 C) (08/02 1100) Temp Source: Oral (08/02 1100) BP: 128/62 (08/02 1100) Pulse Rate: 81 (08/02 1100)  Labs:  Recent Labs  11/18/15 1610  11/19/15 0105 11/19/15 0808  11/20/15 0508 11/21/15 0410 11/21/15 0734 11/21/15 1347  HGB  --   < > 11.0*  --   --   --  11.5* 10.9*  --   HCT  --   --  33.2*  --   --   --  35.1* 33.8*  --   PLT  --   --  189  --   --   --  184 198  --   APTT 63*  --  110* 109*  --   --   --   --   --   HEPARINUNFRC 0.67  --  0.64 0.68  < > 0.43 0.27*  --  0.38  CREATININE  --   --  1.30*  --   --  1.25* 1.35*  --   --   < > = values in this interval not displayed.  Estimated Creatinine Clearance: 68.5 mL/min (by C-G formula based on SCr of 1.35 mg/dL).   Medical History: Past Medical History:  Diagnosis Date  . Chronic atrial fibrillation (HCC)    a. on Xarelto  . Diabetes mellitus without complication ([place])   . Hyperlipemia   . Hypertension   . Nonischemic cardiomyopathy [facility])    a. s/p [employer] ICD placement 2008, generator change 11/2013  b. Echo 2015: EF 30-35%    Assessment: 70 yo male who was transitioned back to  Xarelto on 11/14/15. Had NG tube placed today and was found to have ileus. Dr. Azalee Course consulted pharmacy for transition back to heparin.  (Patient was previous on heparin 1400 units/hr).   Patient was administered Xarelto today at 1719, however MD does not believe it will be absorbed.   Goal of Therapy:  Heparin level 0.3-0.7 units/ml aPTT 68-109 seconds Monitor platelets by anticoagulation protocol: Yes   Plan:   7/28-Will start patient on half the dose with no bolus due to Xarelto given tonight. Heparin 700units/hr ordered, with  anti-Xa and APTT ordered after 6 hours.   7/29 04:00 aPTT 55. Increase rate to 800 units/hr and recheck in 6 hours.  7/29 aPTT at 1051= 56. HL= 3.22.   Will increase Heparin drip rate to 900 units/hr and recheck aPTT in 6hrs at 1830. (once aPTT and Heparin level correlate then transition to using just Heparin level.). Anticipate checking next HL with labs on 7/30.  7/29 APTT @ 1826 = 55 (subtherapeutic) on heparin infusion of 900 units/hr. Confirmed with RN that there had been no interruptions in infusion. Will increase to 1100 units/hr per  protocol and recheck heparin level and APTT in 6 hours. Can use HL for monitoring once it correlates with APTT.   7/30 0300 aPTT 52, heparin level 1.18. Increased rate to 1200 units/hr. Recheck in 6 hours.  7/30 aPTT at 0840 = 68. HL= 0.97.  Will increase Heparin to 1300 units/hr. REcheck aPTT/HL in 6 hrs at 1630. (once aPTT and Heparin level correlate then transition to using just Heparin level.).  7/30 APTT @ 1610 = 63 (subtherapeutic) and HL = 0.67 on infusion rate of 1300 units/hr. Confirmed with RN that there were no interruptions in infusion. Increase rate to 1500 units/hr and check APTT/HL in 6 hours.  7/31 0100 heparin level 0.64, aPTT 110. Decrease rate to 1400 units/hr and recheck aPTT and heparin level in 6 hours. Anticipate that levels should correlate and can follow by heparin level alone thereafter.  7/31 0800  HL=0.68, aPTT 109 HL and APTT correlate. Continue drip at 1400 units/hr and recheck level in 6 hours.  7/31 1435     HL=0.49, will continue drip at 1400 units/hr and recheck level with am labs.   08/01 0508 HL 0.43. Continue current rate. Pharmacy will continue to monitor daily HL and CBC. -NAC  0802 0410 HL subtherapeutic. 1500 unit IV x 1 bolus and increase rate to 1500 units/hr. Will recheck heparin level in 6 hours.  0802 1347 HL therapeutic at 0.38 Continue rate at 1500 units/hr and check confirmation level in 6 hours  Shanay Woolman D Ayssa Bentivegna, Pharm.D. Clinical Pharmacist 11/21/2015 2:31 PM

## 2015-11-21 NOTE — Progress Notes (Signed)
[facility] at [place] NAME: Alex Vance    MR#:  [id number]  DATE OF BIRTH:  06-05-1945  SUBJECTIVE:  CHIEF COMPLAINT:   Chief Complaint  Patient presents with  . Dizziness  . Abdominal Pain   - Patient with known A. fib, noncompliant with Xarelto presented with mesenteric ischemic area. -NG tube removed as drainage improved  REVIEW OF SYSTEMS:  Review of Systems  Constitutional: Negative for chills, fever and malaise/fatigue.  HENT: Negative for ear discharge, ear pain and nosebleeds.   Eyes: Negative for blurred vision and double vision.  Respiratory: Negative for cough, shortness of breath and wheezing.   Cardiovascular: Negative for chest pain and palpitations.  Gastrointestinal: Negative for abdominal pain, constipation, diarrhea, nausea and vomiting.  Genitourinary: Negative for dysuria.  Neurological: Negative for dizziness, seizures and headaches.  Psychiatric/Behavioral: Negative for depression.    DRUG ALLERGIES:   Allergies  Allergen Reactions  . Penicillins Rash    Has patient had a PCN reaction causing immediate rash, facial/tongue/throat swelling, SOB or lightheadedness with hypotension: Yes Has patient had a PCN reaction causing severe rash involving mucus membranes or skin necrosis: No Has patient had a PCN reaction that required hospitalization No Has patient had a PCN reaction occurring within the last 10 years: No If all of the above answers are "NO", then may proceed with Cephalosporin use.    VITALS:  Blood pressure 128/62, pulse 81, temperature 98.1 F (36.7 C), temperature source Oral, resp. rate 18, height 6\' 2"  (1.88 m), weight 104.8 kg (231 lb 1.6 oz), SpO2 95 %.  PHYSICAL EXAMINATION:  Physical Exam  GENERAL:  70 y.o.-year-old patient lying in the bed with no acute distress.  EYES: Pupils equal, round, reactive to light and accommodation. No scleral icterus. Extraocular muscles intact.   HEENT: Head atraumatic, normocephalic. Oropharynx and nasopharynx clear. NG tube in place- to continuous suction NECK:  Supple, no jugular venous distention. No thyroid enlargement, no tenderness.  LUNGS: Normal breath sounds bilaterally, no wheezing, rales,rhonchi or crepitation. No use of accessory muscles of respiration.  CARDIOVASCULAR: S1, S2 normal. No murmurs, rubs, or gallops.  ABDOMEN:  soft today, , nontender, less distended. Better bowel sounds. No organomegaly or mass.  EXTREMITIES: No pedal edema, cyanosis, or clubbing.  NEUROLOGIC: Cranial nerves II through XII are intact. Muscle strength 5/5 in all extremities. Sensation intact. Gait not checked.  PSYCHIATRIC: The patient is alert and oriented x 3.  SKIN: No obvious rash, lesion, or ulcer.    LABORATORY PANEL:   CBC  Recent Labs Lab 11/21/15 0734  WBC 37.8*  HGB 10.9*  HCT 33.8*  PLT 198   ------------------------------------------------------------------------------------------------------------------  Chemistries   Recent Labs Lab 11/20/15 0508 11/21/15 0410  NA 145 143  K 3.3* 3.3*  CL 117* 114*  CO2 21* 18*  GLUCOSE 146* 179*  BUN 11 10  CREATININE 1.25* 1.35*  CALCIUM 8.0* 8.2*  MG 1.8  --    ------------------------------------------------------------------------------------------------------------------  Cardiac Enzymes No results for input(s): TROPONINI in the last 168 hours. ------------------------------------------------------------------------------------------------------------------  RADIOLOGY:  Dg Chest 2 View  Result Date: 11/21/2015 CLINICAL DATA:  White blood cell abnormality. Nonischemic cardiomyopathy. Ex-smoker. EXAM: CHEST  2 VIEW COMPARISON:  11/12/2015. FINDINGS: The cardiac silhouette is mildly enlarged with an interval decrease in size. Stable left subclavian pacer and AICD leads. Clear lungs with normal vascularity. Unremarkable bones. IMPRESSION: No acute abnormality.  Electronically Signed   By: Claudie Revering M.D.   On:  11/21/2015 13:13   Ct Abdomen Pelvis W Contrast  Result Date: 11/21/2015 CLINICAL DATA:  Generalized abdominal pain a few days. Recent small bowel obstruction. NG tube removal. Elevated white blood cell count. EXAM: CT ABDOMEN AND PELVIS WITH CONTRAST TECHNIQUE: Multidetector CT imaging of the abdomen and pelvis was performed using the standard protocol following bolus administration of intravenous contrast. CONTRAST:  155mL ISOVUE-300 IOPAMIDOL (ISOVUE-300) INJECTION 61% COMPARISON:  11/11/2015 FINDINGS: Lower chest: Lung bases demonstrate mild bibasilar dependent atelectasis. No effusion. Cardiac pacer leads over the heart. Calcified plaque over the coronary arteries. Mild cardiomegaly. Hepatobiliary: There is mild heterogeneous low attenuation over the periphery of the right lobe of the liver more focal on the delayed images without significant change from the prior exam. Gallbladder and biliary tree are within normal. Pancreas: No mass, inflammatory changes, or other significant abnormality. Spleen: Within normal limits in size and appearance. Adrenals/Urinary Tract: Adrenal glands are normal symmetric. Kidneys are normal in size without hydronephrosis or nephrolithiasis. J few small bilateral renal cortical hypodensities too small to characterize but likely cysts and unchanged. Ureters are within normal. Bladder is normal. Stomach/Bowel: Mild gastric distension. Progressive dilatation of several proximal small bowel loops including duodenum and jejunum in the right mid to upper abdomen. 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 No evidence of pneumatosis. There is a whirled appearance to the mesentery and SMA/ SMV unchanged. There is no evidence of a transition point. The dilated small bowel may be due to patient's ischemic episode versus possible internal hernia. Tiny amount of fluid in the mid mesentery. Appendix is normal in  caliber with evidence of an appendicolith distally. There is moderate diverticulosis of the colon and redundancy of the sigmoid colon. Vascular/Lymphatic: No pathologically enlarged lymph nodes. Few nonspecific small lymph nodes in the gastrohepatic ligament unchanged. No evidence of abdominal aortic aneurysm. There is moderate calcified plaque over the abdominal aorta and iliac arteries. Again noted is interval resolution of the previously seen SMA thrombus. Reproductive: No mass or other significant abnormality. Other: Small umbilical hernia containing peritoneal fat unchanged. Small right inguinal hernia small oval 2.2 cm density over the right inguinal hernia which may represent complex fluid or hemorrhage. Musculoskeletal:  Minimal degenerate change of the spine and hips. IMPRESSION: Interval resolution of previously seen SMA thrombus. Progressive dilatation of the stomach, proximal small bowel including duodenum and proximal to mid jejunal loops in the mid to upper right abdomen. No evidence of pneumatosis. No transition point identified. Findings may be due to patient's ischemic episode versus partial small bowel obstruction due to internal hernia. Stable subtle heterogeneous low attenuation over the right lobe of the liver more focal on the delayed images and not significantly changed. Recommend attention to this area on follow-up as cannot exclude developing abscess in this patient with pain, elevated white blood cell count on recent ischemic episode. Few small sub cm renal cortical hypodensities too small to characterize, but likely cysts. Diverticulosis of the colon. Right inguinal hernia with 2.2 cm oval density which may be due to complex fluid/ hemorrhage. Small nonspecific lymph nodes over the gastrohepatic ligament unchanged. Electronically Signed   By: Marin Olp M.D.   On: 11/21/2015 13:58    EKG:   Orders placed or performed during the hospital encounter of 11/11/15  . ED EKG  . ED EKG   . EKG    ASSESSMENT AND PLAN:   70 year old male with past medical history significant for A. Fib, on compliant with Xarelto, hypertension,  diabetes mellitus, neuropathy presents to hospital secondary to mesenteric ischemia.  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  -Patient is status post angiogram and angioplasty. -Now with SBO- s/p NG tube and removal of NG on 11/19/15 - on clears today, abdomen is soft, passing flatus and also having small BMs- but significantly elevated WBC - so repeat CT abdomen ordered -Management per surgical team - continue to monitor  #2 chronic systolic CHF-well compensated. -Receiving IV fluids- decrease rate. Watch for worsening of CHF. -on Lasix, Aldactone. Also on Coreg and losartan.  #3 type 2 diabetes mellitus-with hypoglycemia. Discontinue Levemir and aspart. Continue sliding scale insulin. Patient remains  nothing by mouth. Also on D5 fluids at 60cc/hr - Sugars are better since then.  #4 Increased WBC- no active infection identified. No diarrhea noted either, UA pending, CXR with no infiltrate - CT abdomen pending- r/o ischemic bowel- but exam not suggesting that - Blood cultures are also ordered  #5 chronic A. fib-on Coreg- dose increased, PVCs noted. chronic. Potassium being replaced. -On heparin drip for anticoagulation now. Change to xarelto once able to take PO.  #6 DVT prophylaxis-on heparin drip- change to xarelto once started taking orals  #7 Hypokalemia- replaced, but monitor as on losartan and aldactone    All the records are reviewed and case discussed with Care Management/Social Workerr. Management plans discussed with the patient, family and they are in agreement.  CODE STATUS: Full Code  TOTAL TIME TAKING CARE OF THIS PATIENT: 35 minutes.   POSSIBLE D/C TOMORROW, DEPENDING ON CLINICAL CONDITION.   Novelle Addair M.D on  11/21/2015 at 2:10 PM  Between 7am to 6pm - Pager - [phone]  After 6pm go to [email or web address] - password [email or web address] [facility]  [facility]  Office  [phone]  CC: Primary care physician; No primary care provider on file.

## 2015-11-21 NOTE — Progress Notes (Signed)
Pt has tolerated a regular diet. No nausea or vomiting. Will continue to monitor pt.  Angus Seller

## 2015-11-22 LAB — CBC WITH DIFFERENTIAL/PLATELET
Basophils Absolute: 0 10*3/uL (ref 0–0.1)
Basophils Relative: 0 %
Eosinophils Absolute: 0.1 10*3/uL (ref 0–0.7)
Eosinophils Relative: 1 %
HEMATOCRIT: 32.4 % — AB (ref 40.0–52.0)
HEMOGLOBIN: 10.4 g/dL — AB (ref 13.0–18.0)
LYMPHS ABS: 1.6 10*3/uL (ref 1.0–3.6)
LYMPHS PCT: 9 %
MCH: 26.9 pg (ref 26.0–34.0)
MCHC: 32.1 g/dL (ref 32.0–36.0)
MCV: 83.8 fL (ref 80.0–100.0)
MONOS PCT: 7 %
Monocytes Absolute: 1.2 10*3/uL — ABNORMAL HIGH (ref 0.2–1.0)
NEUTROS ABS: 14.7 10*3/uL — AB (ref 1.4–6.5)
NEUTROS PCT: 83 %
Platelets: 169 10*3/uL (ref 150–440)
RBC: 3.87 MIL/uL — ABNORMAL LOW (ref 4.40–5.90)
RDW: 17.5 % — AB (ref 11.5–14.5)
WBC: 17.6 10*3/uL — ABNORMAL HIGH (ref 3.8–10.6)

## 2015-11-22 LAB — BASIC METABOLIC PANEL
Anion gap: 6 (ref 5–15)
BUN: 12 mg/dL (ref 6–20)
CALCIUM: 7.3 mg/dL — AB (ref 8.9–10.3)
CHLORIDE: 113 mmol/L — AB (ref 101–111)
CO2: 19 mmol/L — AB (ref 22–32)
CREATININE: 1.43 mg/dL — AB (ref 0.61–1.24)
GFR calc non Af Amer: 49 mL/min — ABNORMAL LOW (ref >60)
GFR, EST AFRICAN AMERICAN: 57 mL/min — AB (ref >60)
GLUCOSE: 181 mg/dL — AB (ref 65–99)
Potassium: 3.9 mmol/L (ref 3.5–5.1)
Sodium: 138 mmol/L (ref 135–145)

## 2015-11-22 LAB — HEPATIC FUNCTION PANEL
ALBUMIN: 2.5 g/dL — AB (ref 3.5–5.0)
ALK PHOS: 80 U/L (ref 38–126)
ALT: 19 U/L (ref 17–63)
AST: 22 U/L (ref 15–41)
Bilirubin, Direct: 0.4 mg/dL (ref 0.1–0.5)
Indirect Bilirubin: 0.5 mg/dL (ref 0.3–0.9)
TOTAL PROTEIN: 5.8 g/dL — AB (ref 6.5–8.1)
Total Bilirubin: 0.9 mg/dL (ref 0.3–1.2)

## 2015-11-22 LAB — PROTIME-INR
INR: 1.28
Prothrombin Time: 16.1 seconds — ABNORMAL HIGH (ref 11.4–15.2)

## 2015-11-22 LAB — GLUCOSE, CAPILLARY
GLUCOSE-CAPILLARY: 178 mg/dL — AB (ref 65–99)
GLUCOSE-CAPILLARY: 218 mg/dL — AB (ref 65–99)
GLUCOSE-CAPILLARY: 171 mg/dL — AB (ref 65–99)
Glucose-Capillary: 173 mg/dL — ABNORMAL HIGH (ref 65–99)

## 2015-11-22 LAB — HEPARIN LEVEL (UNFRACTIONATED): Heparin Unfractionated: 0.17 IU/mL — ABNORMAL LOW (ref 0.30–0.70)

## 2015-11-22 MED ORDER — CARVEDILOL 6.25 MG PO TABS
6.2500 mg | ORAL_TABLET | Freq: Two times a day (BID) | ORAL | Status: DC
  Administered 2015-11-22 – 2015-11-23 (×2): 6.25 mg via ORAL
  Filled 2015-11-22 (×2): qty 1

## 2015-11-22 MED ORDER — RIVAROXABAN 20 MG PO TABS: 20.0000 mg | ORAL_TABLET | Freq: Every day | ORAL | Status: DC

## 2015-11-22 MED ORDER — RIVAROXABAN 20 MG PO TABS
20.0000 mg | ORAL_TABLET | Freq: Every day | ORAL | Status: DC
  Administered 2015-11-22 – 2015-11-23 (×2): 20 mg via ORAL
  Filled 2015-11-22 (×2): qty 1

## 2015-11-22 MED ORDER — HEPARIN BOLUS VIA INFUSION
3000.0000 [IU] | Freq: Once | INTRAVENOUS | Status: AC
  Administered 2015-11-22: 3000 [IU] via INTRAVENOUS
  Filled 2015-11-22: qty 3000

## 2015-11-22 MED ORDER — SODIUM CHLORIDE 0.9 % IV SOLN
INTRAVENOUS | Status: DC
  Administered 2015-11-22: 1000 mL via INTRAVENOUS

## 2015-11-22 MED ORDER — ENSURE ENLIVE PO LIQD
237.0000 mL | Freq: Two times a day (BID) | ORAL | Status: DC
  Administered 2015-11-22 – 2015-11-23 (×3): 237 mL via ORAL

## 2015-11-22 NOTE — Progress Notes (Addendum)
Patient discharged to home as ordered. Follow up appointment given as ordered, no acute distress noted. Patient ambulates without assistance. No seizure activity noted at this time No acute distress noted.      Charge RN notified of the above note from Dr. Adonis Huguenin and note was entered in error. Note was supposed to be written for the pt in 218 after their discharge.   Original note written by Orlinda Blalock, RN

## 2015-11-22 NOTE — Consult Note (Signed)
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 Penicillins Rash    Has patient had a PCN reaction causing immediate rash, facial/tongue/throat swelling, SOB or lightheadedness with hypotension: Yes Has patient had a PCN reaction causing severe rash involving mucus membranes or skin necrosis: No Has patient had a PCN reaction that required hospitalization No Has patient had a PCN reaction occurring within the last 10 years: No If all of the above answers are "NO", then may proceed with Cephalosporin use.    Patient Measurements: Height: 6\' 2"  (188 cm) Weight: 231 lb 1.6 oz (104.8 kg) IBW/kg (Calculated) : 82.2 Heparin Dosing Weight:   Vital Signs: Temp: 97.5 F (36.4 C) (08/03 0527) Temp Source: Oral (08/03 0527) BP: 102/62 (08/03 0527) Pulse Rate: 66 (08/03 0527)  Labs:  Recent Labs  11/20/15 0508  11/21/15 0410 11/21/15 0734 11/21/15 1347 11/21/15 2058 11/22/15 0545  HGB  --   < > 11.5* 10.9*  --   --  10.4*  HCT  --   --  35.1* 33.8*  --   --  32.4*  PLT  --   --  184 198  --   --  169  HEPARINUNFRC 0.43  --  0.27*  --  0.38 0.38 0.17*  CREATININE 1.25*  --  1.35*  --   --   --  1.43*  < > = values in this interval not displayed.  Estimated Creatinine Clearance: 64.7 mL/min (by C-G formula based on SCr of 1.43 mg/dL).   Medical History: Past Medical History:  Diagnosis Date  . Chronic atrial fibrillation (HCC)    a. on Xarelto  . Diabetes mellitus without complication ([place])   . Hyperlipemia   . Hypertension   . Nonischemic cardiomyopathy [facility])    a. s/p [employer] ICD placement 2008, generator change 11/2013  b. Echo 2015: EF 30-35%    Medications:  Scheduled:  . bisacodyl  10 mg Rectal Daily  . carvedilol  12.5 mg Oral BID WC  . ciprofloxacin  400 mg Intravenous Q12H  . insulin aspart  0-15 Units  Subcutaneous TID AC & HS  . metronidazole  500 mg Intravenous Q8H  . pantoprazole  40 mg Oral Daily  . rivaroxaban  20 mg Oral Daily  . simvastatin  20 mg Oral QHS    Assessment: Pt is a 70 year old male with A. Fib and superior mesenteric artery occlusion s/p intervention. Pt has been on a heparin drip for the last several days. Pharmacy is consulted to switch patient back to xarelto. Goal of Therapy:   Monitor platelets by anticoagulation protocol: Yes   Plan:  xarelto 20mg  daily with breakfast. Turn heparin drip off when first dose is given. RN, Santiago Glad notified.  Ramond Dial, Pharm.D Clinical Pharmacist  11/22/2015,8:50 AM

## 2015-11-22 NOTE — Progress Notes (Signed)
[facility] at [place] NAME: Alex Vance    MR#:  [id number]  DATE OF BIRTH:  [birth year]  SUBJECTIVE:  CHIEF COMPLAINT:   Chief Complaint  Patient presents with  . Dizziness  . Abdominal Pain   - WBC improving, started on ABX for colitis by surgical team yesterday - patient denies any complaints - BP was low yesterday needing a fluid bolus  REVIEW OF SYSTEMS:  Review of Systems  Constitutional: Negative for chills, fever and malaise/fatigue.  HENT: Negative for ear discharge, ear pain and nosebleeds.   Eyes: Negative for blurred vision and double vision.  Respiratory: Negative for cough, shortness of breath and wheezing.   Cardiovascular: Negative for chest pain and palpitations.  Gastrointestinal: Negative for abdominal pain, constipation, diarrhea, nausea and vomiting.  Genitourinary: Negative for dysuria.  Neurological: Negative for dizziness, seizures and headaches.  Psychiatric/Behavioral: Negative for depression.    DRUG ALLERGIES:   Allergies  Allergen Reactions  . Penicillins Rash    Has patient had a PCN reaction causing immediate rash, facial/tongue/throat swelling, SOB or lightheadedness with hypotension: Yes Has patient had a PCN reaction causing severe rash involving mucus membranes or skin necrosis: No Has patient had a PCN reaction that required hospitalization No Has patient had a PCN reaction occurring within the last 10 years: No If all of the above answers are "NO", then may proceed with Cephalosporin use.    VITALS:  Blood pressure 102/62, pulse 66, temperature 97.5 F (36.4 C), temperature source Oral, resp. rate 20, height 6\' 2"  (1.88 m), weight 104.8 kg (231 lb 1.6 oz), SpO2 99 %.  PHYSICAL EXAMINATION:  Physical Exam  GENERAL:  70 y.o.-year-old patient lying in the bed with no acute distress.  EYES: Pupils equal, round, reactive to light and accommodation. No scleral icterus. Extraocular  muscles intact.  HEENT: Head atraumatic, normocephalic. Oropharynx and nasopharynx clear. NECK:  Supple, no jugular venous distention. No thyroid enlargement, no tenderness.  LUNGS: Normal breath sounds bilaterally, no wheezing, rales,rhonchi or crepitation. No use of accessory muscles of respiration.  CARDIOVASCULAR: S1, S2 normal. No murmurs, rubs, or gallops.  ABDOMEN:  soft today, , nontender, less distended. Better bowel sounds. No organomegaly or mass.  EXTREMITIES: No pedal edema, cyanosis, or clubbing.  NEUROLOGIC: Cranial nerves II through XII are intact. Muscle strength 5/5 in all extremities. Sensation intact. Gait not checked.  PSYCHIATRIC: The patient is alert and oriented x 3.  SKIN: No obvious rash, lesion, or ulcer.    LABORATORY PANEL:   CBC  Recent Labs Lab 11/22/15 0545  WBC 17.6*  HGB 10.4*  HCT 32.4*  PLT 169   ------------------------------------------------------------------------------------------------------------------  Chemistries   Recent Labs Lab 11/20/15 0508  11/22/15 0545  NA 145  < > 138  K 3.3*  < > 3.9  CL 117*  < > 113*  CO2 21*  < > 19*  GLUCOSE 146*  < > 181*  BUN 11  < > 12  CREATININE 1.25*  < > 1.43*  CALCIUM 8.0*  < > 7.3*  MG 1.8  --   --   < > = values in this interval not displayed. ------------------------------------------------------------------------------------------------------------------  Cardiac Enzymes No results for input(s): TROPONINI in the last 168 hours. ------------------------------------------------------------------------------------------------------------------  RADIOLOGY:  Dg Chest 2 View  Result Date: 11/21/2015 CLINICAL DATA:  White blood cell abnormality. Nonischemic cardiomyopathy. Ex-smoker. EXAM: CHEST  2 VIEW COMPARISON:  11/12/2015. FINDINGS: The cardiac silhouette is mildly enlarged with an  interval decrease in size. Stable left subclavian pacer and AICD leads. Clear lungs with normal  vascularity. Unremarkable bones. IMPRESSION: No acute abnormality. Electronically Signed   By: Claudie Revering M.D.   On: 11/21/2015 13:13   Ct Abdomen Pelvis W Contrast  Result Date: 11/21/2015 CLINICAL DATA:  Generalized abdominal pain a few days. Recent small bowel obstruction. NG tube removal. Elevated white blood cell count. EXAM: CT ABDOMEN AND PELVIS WITH CONTRAST TECHNIQUE: Multidetector CT imaging of the abdomen and pelvis was performed using the standard protocol following bolus administration of intravenous contrast. CONTRAST:  15mL ISOVUE-300 IOPAMIDOL (ISOVUE-300) INJECTION 61% COMPARISON:  11/11/2015 FINDINGS: Lower chest: Lung bases demonstrate mild bibasilar dependent atelectasis. No effusion. Cardiac pacer leads over the heart. Calcified plaque over the coronary arteries. Mild cardiomegaly. Hepatobiliary: There is mild heterogeneous low attenuation over the periphery of the right lobe of the liver more focal on the delayed images without significant change from the prior exam. Gallbladder and biliary tree are within normal. Pancreas: No mass, inflammatory changes, or other significant abnormality. Spleen: Within normal limits in size and appearance. Adrenals/Urinary Tract: Adrenal glands are normal symmetric. Kidneys are normal in size without hydronephrosis or nephrolithiasis. J few small bilateral renal cortical hypodensities too small to characterize but likely cysts and unchanged. Ureters are within normal. Bladder is normal. Stomach/Bowel: Mild gastric distension. Progressive dilatation of several proximal small bowel loops including duodenum and jejunum in the right mid to upper abdomen. 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 No evidence of pneumatosis. There is a whirled appearance to the mesentery and SMA/ SMV unchanged. There is no evidence of a transition point. The dilated small bowel may be due to patient's ischemic episode versus possible internal hernia. Tiny  amount of fluid in the mid mesentery. Appendix is normal in caliber with evidence of an appendicolith distally. There is moderate diverticulosis of the colon and redundancy of the sigmoid colon. Vascular/Lymphatic: No pathologically enlarged lymph nodes. Few nonspecific small lymph nodes in the gastrohepatic ligament unchanged. No evidence of abdominal aortic aneurysm. There is moderate calcified plaque over the abdominal aorta and iliac arteries. Again noted is interval resolution of the previously seen SMA thrombus. Reproductive: No mass or other significant abnormality. Other: Small umbilical hernia containing peritoneal fat unchanged. Small right inguinal hernia small oval 2.2 cm density over the right inguinal hernia which may represent complex fluid or hemorrhage. Musculoskeletal:  Minimal degenerate change of the spine and hips. IMPRESSION: Interval resolution of previously seen SMA thrombus. Progressive dilatation of the stomach, proximal small bowel including duodenum and proximal to mid jejunal loops in the mid to upper right abdomen. No evidence of pneumatosis. No transition point identified. Findings may be due to patient's ischemic episode versus partial small bowel obstruction due to internal hernia. Stable subtle heterogeneous low attenuation over the right lobe of the liver more focal on the delayed images and not significantly changed. Recommend attention to this area on follow-up as cannot exclude developing abscess in this patient with pain, elevated white blood cell count on recent ischemic episode. Few small sub cm renal cortical hypodensities too small to characterize, but likely cysts. Diverticulosis of the colon. Right inguinal hernia with 2.2 cm oval density which may be due to complex fluid/ hemorrhage. Small nonspecific lymph nodes over the gastrohepatic ligament unchanged. Electronically Signed   By: Marin Olp M.D.   On: 11/21/2015 13:58    EKG:   Orders placed or performed  during the hospital encounter of  11/11/15  . ED EKG  . ED EKG  . EKG    ASSESSMENT AND PLAN:   70 year old male with past medical history significant for A. Fib, on compliant with Xarelto, hypertension, diabetes mellitus, neuropathy presents to hospital secondary to mesenteric ischemia.  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  -Patient is status post angiogram and angioplasty. -s/p SBO- s/p NG tube and removal of NG on 11/19/15 -tolerating liquids- advanced to solid diet -Management per surgical team - continue to monitor  #2 chronic systolic CHF-well compensated. -Receiving IV fluids-. Watch for worsening of CHF. - lasix held due to hypotension  #3 type 2 diabetes mellitus-off Levemir due to hypoglycemia and poor po intake - discontinue D5 fluids today as started on a  diet. Continue sliding scale insulin.   #4 Leukocytosis- not sure what the cause is. Cultures negative so far - CXR with no PNA - Repeat CT abd with bowel dilation but no acute abnormalities, ? Abscess in liver, but nothing clinically - Was started on IV cipro and flagyl and patient responding. Continue to monitor wbc  #5 chronic A. fib-on Coreg- dose increased, PVCs noted. chronic. - changed IV heparin to xarelto for anticoagulation today.  #6 DVT prophylaxis-on xarelto today   Physical Therapy, Ambulate Anticipate discharge tomorrow Will sign off and follow as needed.   All the records are reviewed and case discussed with Care Management/Social Workerr. Management plans discussed with the patient, family and they are in agreement.  CODE STATUS: Full Code  TOTAL TIME TAKING CARE OF THIS PATIENT: 35 minutes.   POSSIBLE D/C TOMORROW, DEPENDING ON CLINICAL CONDITION.   Alex Vance M.D on 11/22/2015 at 8:41 AM  Between 7am to 6pm - Pager - [phone]  After 6pm go to [email or web address] - password [email or web address]  [facility]  [facility]  Office  [phone]  CC: Primary care physician; No primary care provider on file.

## 2015-11-22 NOTE — Care Management Important Message (Signed)
Important Message  Patient Details  Name: Alex Vance MRN: [id number] Date of Birth: August 07, 1945   Medicare Important Message Given:  Yes    Beverly Sessions, RN 11/22/2015, 3:01 PM

## 2015-11-22 NOTE — Progress Notes (Signed)
CC: Ischemic bowel Subjective: Patient continues to report feeling much better. Yesterday had significant workup for unexplained leukocytosis. Patient continues to have no symptoms related to active infection. He denies any fevers, chills, pain, shortness of breath, dysuria, diarrhea, constipation. All images and other workups currently without definitive source of infection. However, patient is clinically much better.  Objective: Vital signs in last 24 hours: Temp:  [97.5 F (36.4 C)-98.1 F (36.7 C)] 97.5 F (36.4 C) (08/03 0527) Pulse Rate:  [66-81] 66 (08/03 0527) Resp:  [18-20] 20 (08/03 0527) BP: (95-128)/(49-62) 102/62 (08/03 0527) SpO2:  [95 %-100 %] 99 % (08/03 0527) Last BM Date: 11/21/15  Intake/Output from previous day: 08/02 0701 - 08/03 0700 In: 2591.9 [P.O.:675; I.V.:1716.9; IV Piggyback:200] Out: 1400 [Urine:1400] Intake/Output this shift: No intake/output data recorded.  Physical exam:  Gen.: No acute distress Chest: Clear to auscultation Heart: Regular rate and rhythm Abdomen: Soft, nontender, nondistended  Lab Results: CBC   Recent Labs  11/21/15 0734 11/22/15 0545  WBC 37.8* 17.6*  HGB 10.9* 10.4*  HCT 33.8* 32.4*  PLT 198 169   BMET  Recent Labs  11/21/15 0410 11/22/15 0545  NA 143 138  K 3.3* 3.9  CL 114* 113*  CO2 18* 19*  GLUCOSE 179* 181*  BUN 10 12  CREATININE 1.35* 1.43*  CALCIUM 8.2* 7.3*   PT/INR No results for input(s): LABPROT, INR in the last 72 hours. ABG No results for input(s): PHART, HCO3 in the last 72 hours.  Invalid input(s): PCO2, PO2  Studies/Results: Dg Chest 2 View  Result Date: 11/21/2015 CLINICAL DATA:  White blood cell abnormality. Nonischemic cardiomyopathy. Ex-smoker. EXAM: CHEST  2 VIEW COMPARISON:  11/12/2015. FINDINGS: The cardiac silhouette is mildly enlarged with an interval decrease in size. Stable left subclavian pacer and AICD leads. Clear lungs with normal vascularity. Unremarkable bones.  IMPRESSION: No acute abnormality. Electronically Signed   By: Claudie Revering M.D.   On: 11/21/2015 13:13   Ct Abdomen Pelvis W Contrast  Result Date: 11/21/2015 CLINICAL DATA:  Generalized abdominal pain a few days. Recent small bowel obstruction. NG tube removal. Elevated white blood cell count. EXAM: CT ABDOMEN AND PELVIS WITH CONTRAST TECHNIQUE: Multidetector CT imaging of the abdomen and pelvis was performed using the standard protocol following bolus administration of intravenous contrast. CONTRAST:  12mL ISOVUE-300 IOPAMIDOL (ISOVUE-300) INJECTION 61% COMPARISON:  11/11/2015 FINDINGS: Lower chest: Lung bases demonstrate mild bibasilar dependent atelectasis. No effusion. Cardiac pacer leads over the heart. Calcified plaque over the coronary arteries. Mild cardiomegaly. Hepatobiliary: There is mild heterogeneous low attenuation over the periphery of the right lobe of the liver more focal on the delayed images without significant change from the prior exam. Gallbladder and biliary tree are within normal. Pancreas: No mass, inflammatory changes, or other significant abnormality. Spleen: Within normal limits in size and appearance. Adrenals/Urinary Tract: Adrenal glands are normal symmetric. Kidneys are normal in size without hydronephrosis or nephrolithiasis. J few small bilateral renal cortical hypodensities too small to characterize but likely cysts and unchanged. Ureters are within normal. Bladder is normal. Stomach/Bowel: Mild gastric distension. Progressive dilatation of several proximal small bowel loops including duodenum and jejunum in the right mid to upper abdomen. 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 No evidence of pneumatosis. There is a whirled appearance to the mesentery and SMA/ SMV unchanged. There is no evidence of a transition point. The dilated small bowel may be due to patient's ischemic episode versus possible internal hernia. Tiny amount of  fluid in the mid  mesentery. Appendix is normal in caliber with evidence of an appendicolith distally. There is moderate diverticulosis of the colon and redundancy of the sigmoid colon. Vascular/Lymphatic: No pathologically enlarged lymph nodes. Few nonspecific small lymph nodes in the gastrohepatic ligament unchanged. No evidence of abdominal aortic aneurysm. There is moderate calcified plaque over the abdominal aorta and iliac arteries. Again noted is interval resolution of the previously seen SMA thrombus. Reproductive: No mass or other significant abnormality. Other: Small umbilical hernia containing peritoneal fat unchanged. Small right inguinal hernia small oval 2.2 cm density over the right inguinal hernia which may represent complex fluid or hemorrhage. Musculoskeletal:  Minimal degenerate change of the spine and hips. IMPRESSION: Interval resolution of previously seen SMA thrombus. Progressive dilatation of the stomach, proximal small bowel including duodenum and proximal to mid jejunal loops in the mid to upper right abdomen. No evidence of pneumatosis. No transition point identified. Findings may be due to patient's ischemic episode versus partial small bowel obstruction due to internal hernia. Stable subtle heterogeneous low attenuation over the right lobe of the liver more focal on the delayed images and not significantly changed. Recommend attention to this area on follow-up as cannot exclude developing abscess in this patient with pain, elevated white blood cell count on recent ischemic episode. Few small sub cm renal cortical hypodensities too small to characterize, but likely cysts. Diverticulosis of the colon. Right inguinal hernia with 2.2 cm oval density which may be due to complex fluid/ hemorrhage. Small nonspecific lymph nodes over the gastrohepatic ligament unchanged. Electronically Signed   By: Marin Olp M.D.   On: 11/21/2015 13:58    Anti-infectives: Anti-infectives    Start     Dose/Rate Route  Frequency Ordered Stop   11/21/15 1445  ciprofloxacin (CIPRO) IVPB 400 mg     400 mg 200 mL/hr over 60 Minutes Intravenous Every 12 hours 11/21/15 1444     11/21/15 1445  metroNIDAZOLE (FLAGYL) IVPB 500 mg     500 mg 100 mL/hr over 60 Minutes Intravenous Every 8 hours 11/21/15 1444     11/12/15 0200  ciprofloxacin (CIPRO) IVPB 400 mg  Status:  Discontinued     400 mg 200 mL/hr over 60 Minutes Intravenous Every 12 hours 11/11/15 1658 11/15/15 1248   11/12/15 0000  metroNIDAZOLE (FLAGYL) IVPB 500 mg  Status:  Discontinued     500 mg 100 mL/hr over 60 Minutes Intravenous Every 8 hours 11/11/15 1658 11/15/15 1248   11/11/15 1828  ciprofloxacin (CIPRO) 400 MG/200ML IVPB    Comments:  Sharen Heck: cabinet override      11/11/15 1828 11/12/15 0629   11/11/15 1615  ciprofloxacin (CIPRO) IVPB 400 mg     400 mg 200 mL/hr over 60 Minutes Intravenous  Once 11/11/15 1600 11/11/15 1715   11/11/15 1600  metroNIDAZOLE (FLAGYL) IVPB 500 mg     500 mg 100 mL/hr over 60 Minutes Intravenous  Once 11/11/15 1600 11/11/15 1710      Assessment/Plan:  70 year old male admitted initially for ischemic bowel. Unexplained leukocytosis, markedly improved after 1 day of IV antibiotics. Plan to continue IV antibiotics until white blood cell count has normalized. After which we will transition to a course of oral antibiotics. Once on all oral medications will be ready for discharge. Encourage ambulation, incisions from her usage, oral intake. Appreciate internal medicine assistance with this complicated patient.  Namon Villarin T. Adonis Huguenin, MD, FACS  11/22/2015

## 2015-11-22 NOTE — Progress Notes (Addendum)
Nutrition Follow-up  DOCUMENTATION CODES:   Severe malnutrition in context of acute illness/injury  INTERVENTION:  -Monitor intake and cater to pt preferences -Recommend Ensure Enlive po BID, each supplement provides 350 kcal and 20 grams of protein    NUTRITION DIAGNOSIS:   Malnutrition related to altered GI function as evidenced by percent weight loss, energy intake < or equal to 50% for > or equal to 5 days.    GOAL:   Patient will meet greater than or equal to 90% of their needs  Improving as tolerating solid foods  MONITOR:   Diet advancement  REASON FOR ASSESSMENT:   Malnutrition Screening Tool, NPO/Clear Liquid Diet    ASSESSMENT:      Pt with unexplained leukocytosis, no symptoms or source of infection per surgery note.  NG tube out at this time  Pt reports tolerating regular diet.  Ate most of eggs, 1 strip of bacon, 1/2 toast this am. Pt reports ate 100% of dinner last night and tolerating well.  Reports that he feels great.    Medications reviewed: aspart, NS 51ml/hr  Labs reviewed: BUN WDL, creatinine 1.43, glucose 181  Diet Order:  Diet regular Room service appropriate? Yes; Fluid consistency: Thin  Skin:  Reviewed, no issues  Last BM:  8/3  Height:   Ht Readings from Last 1 Encounters:  11/11/15 6\' 2"  (1.88 m)    Weight:   Wt Readings from Last 1 Encounters:  11/19/15 231 lb 1.6 oz (104.8 kg)    Ideal Body Weight:     BMI:  Body mass index is 29.67 kg/m.  Estimated Nutritional Needs:   Kcal:  [id number] kcals/d  Protein:  104-125 g/d  Fluid:  >/= 2 L/d  EDUCATION NEEDS:   No education needs identified at this time  Berdine Rasmusson B. Zenia Resides, [place], [place] (pager) Weekend/On-Call pager [phone])

## 2015-11-23 LAB — GLUCOSE, CAPILLARY
Glucose-Capillary: 179 mg/dL — ABNORMAL HIGH (ref 65–99)
Glucose-Capillary: 178 mg/dL — ABNORMAL HIGH (ref 65–99)

## 2015-11-23 LAB — CBC
HEMATOCRIT: 31.3 % — AB (ref 40.0–52.0)
Hemoglobin: 10.4 g/dL — ABNORMAL LOW (ref 13.0–18.0)
MCH: 28 pg (ref 26.0–34.0)
MCHC: 33.3 g/dL (ref 32.0–36.0)
MCV: 84.1 fL (ref 80.0–100.0)
PLATELETS: 177 10*3/uL (ref 150–440)
RBC: 3.72 MIL/uL — AB (ref 4.40–5.90)
RDW: 18.3 % — AB (ref 11.5–14.5)
WBC: 10.8 10*3/uL — AB (ref 3.8–10.6)

## 2015-11-23 MED ORDER — METRONIDAZOLE 500 MG PO TABS
500.0000 mg | ORAL_TABLET | Freq: Three times a day (TID) | ORAL | Status: DC
  Administered 2015-11-23 (×2): 500 mg via ORAL
  Filled 2015-11-23 (×2): qty 1

## 2015-11-23 MED ORDER — HYDROCODONE-ACETAMINOPHEN 5-325 MG PO TABS: 1.0000 | ORAL_TABLET | Freq: Four times a day (QID) | ORAL | 0 refills | Status: DC | PRN

## 2015-11-23 MED ORDER — CIPROFLOXACIN HCL 500 MG PO TABS: 500.0000 mg | ORAL_TABLET | Freq: Two times a day (BID) | ORAL | 0 refills | Status: DC

## 2015-11-23 MED ORDER — CIPROFLOXACIN HCL 250 MG PO TABS
500.0000 mg | ORAL_TABLET | Freq: Two times a day (BID) | ORAL | Status: DC
  Administered 2015-11-23: 500 mg via ORAL
  Filled 2015-11-23: qty 2

## 2015-11-23 MED ORDER — CARVEDILOL 12.5 MG PO TABS
12.5000 mg | ORAL_TABLET | Freq: Two times a day (BID) | ORAL | 2 refills | Status: DC
Start: 2015-11-23 — End: 2018-06-14

## 2015-11-23 MED ORDER — METRONIDAZOLE 500 MG PO TABS: 500.0000 mg | ORAL_TABLET | Freq: Three times a day (TID) | ORAL | 0 refills | Status: DC

## 2015-11-23 NOTE — Discharge Summary (Signed)
Patient ID: Alex Vance MRN: [id number] DOB/AGE: 07-18-1945 70 y.o.  Admit date: 11/11/2015 Discharge date: 11/23/2015  Discharge Diagnoses:  Ischemic bowel  Procedures Performed: Intravascular treatment per vascular surgery for bowel ischemia  Discharged Condition: good  Hospital Course: Patient admitted to the hospital with the diagnosis of ischemic bowel. He underwent an endovascular procedure with the vascular surgeon that revascularized his bowel. He tolerated the procedure well. His postoperative course was complicated by an ileus that gradually resolved. At the time of discharge he had good bowel function, was tolerating regular diet and was on all her medications.  Discharge Orders:  discharge home  Disposition: HOME  Discharge Medications:   Medication List    STOP taking these medications   furosemide 40 MG tablet Commonly known as:  LASIX   LEVEMIR FLEXTOUCH 100 UNIT/ML Pen Generic drug:  Insulin Detemir   losartan 100 MG tablet Commonly known as:  COZAAR   metFORMIN 500 MG tablet Commonly known as:  GLUCOPHAGE   spironolactone 25 MG tablet Commonly known as:  ALDACTONE   VICTOZA 18 MG/3ML Sopn Generic drug:  Liraglutide     TAKE these medications   carvedilol 12.5 MG tablet Commonly known as:  COREG Take 1 tablet (12.5 mg total) by mouth 2 (two) times daily with a meal. What changed:  medication strength  how much to take  when to take this   ciprofloxacin 500 MG tablet Commonly known as:  CIPRO Take 1 tablet (500 mg total) by mouth 2 (two) times daily.   gabapentin 800 MG tablet Commonly known as:  NEURONTIN Take 800 mg by mouth daily as needed (for pain.).   HYDROcodone-acetaminophen 5-325 MG tablet Commonly known as:  NORCO/VICODIN Take 1-2 tablets by mouth every 6 (six) hours as needed for moderate pain or severe pain.   metroNIDAZOLE 500 MG tablet Commonly known as:  FLAGYL Take 1 tablet (500 mg total) by mouth every 8 (eight)  hours.   omeprazole 20 MG capsule Commonly known as:  PRILOSEC Take 20 mg by mouth daily.   simvastatin 20 MG tablet Commonly known as:  ZOCOR Take 20 mg by mouth at bedtime.   XARELTO 20 MG Tabs tablet Generic drug:  rivaroxaban Take 20 mg by mouth at bedtime.        Follwup: Follow-up Information    Jules Husbands, MD. Go in 1 week(s).   Specialty:  General Surgery Why:  post hospital stay Contact information: [address] [place] [place] [address] [phone]           Signed: Clayburn Pert 11/23/2015, 3:37 PM

## 2015-11-23 NOTE — Progress Notes (Signed)
CC: Ischemic bowel Subjective: Patient reports having good night. He denies any fevers, chills, nausea, vomiting, abdominal pain. He has been having bowel function and tolerating his diet. He is ambulated well.  Objective: Vital signs in last 24 hours: Temp:  [97.5 F (36.4 C)-98 F (36.7 C)] 98 F (36.7 C) (08/04 0527) Pulse Rate:  [75-78] 75 (08/04 0527) Resp:  [17-20] 18 (08/04 0527) BP: (99-126)/(59-74) 122/67 (08/04 0527) SpO2:  [100 %] 100 % (08/04 0527) Last BM Date: 11/23/15  Intake/Output from previous day: 08/03 0701 - 08/04 0700 In: 1647.8 [P.O.:600; I.V.:947.8; IV Piggyback:100] Out: 725 [Urine:725] Intake/Output this shift: No intake/output data recorded.  Physical exam:  Gen.: No acute distress Chest: Clear to auscultation Heart: Regular rate and rhythm Abdomen: Soft, nontender, nondistended  Lab Results: CBC   Recent Labs  11/22/15 0545 11/23/15 0456  WBC 17.6* 10.8*  HGB 10.4* 10.4*  HCT 32.4* 31.3*  PLT 169 177   BMET  Recent Labs  11/21/15 0410 11/22/15 0545  NA 143 138  K 3.3* 3.9  CL 114* 113*  CO2 18* 19*  GLUCOSE 179* 181*  BUN 10 12  CREATININE 1.35* 1.43*  CALCIUM 8.2* 7.3*   PT/INR  Recent Labs  11/22/15 0545  LABPROT 16.1*  INR 1.28   ABG No results for input(s): PHART, HCO3 in the last 72 hours.  Invalid input(s): PCO2, PO2  Studies/Results: Dg Chest 2 View  Result Date: 11/21/2015 CLINICAL DATA:  White blood cell abnormality. Nonischemic cardiomyopathy. Ex-smoker. EXAM: CHEST  2 VIEW COMPARISON:  11/12/2015. FINDINGS: The cardiac silhouette is mildly enlarged with an interval decrease in size. Stable left subclavian pacer and AICD leads. Clear lungs with normal vascularity. Unremarkable bones. IMPRESSION: No acute abnormality. Electronically Signed   By: Claudie Revering M.D.   On: 11/21/2015 13:13   Ct Abdomen Pelvis W Contrast  Result Date: 11/21/2015 CLINICAL DATA:  Generalized abdominal pain a few days. Recent  small bowel obstruction. NG tube removal. Elevated white blood cell count. EXAM: CT ABDOMEN AND PELVIS WITH CONTRAST TECHNIQUE: Multidetector CT imaging of the abdomen and pelvis was performed using the standard protocol following bolus administration of intravenous contrast. CONTRAST:  149mL ISOVUE-300 IOPAMIDOL (ISOVUE-300) INJECTION 61% COMPARISON:  11/11/2015 FINDINGS: Lower chest: Lung bases demonstrate mild bibasilar dependent atelectasis. No effusion. Cardiac pacer leads over the heart. Calcified plaque over the coronary arteries. Mild cardiomegaly. Hepatobiliary: There is mild heterogeneous low attenuation over the periphery of the right lobe of the liver more focal on the delayed images without significant change from the prior exam. Gallbladder and biliary tree are within normal. Pancreas: No mass, inflammatory changes, or other significant abnormality. Spleen: Within normal limits in size and appearance. Adrenals/Urinary Tract: Adrenal glands are normal symmetric. Kidneys are normal in size without hydronephrosis or nephrolithiasis. J few small bilateral renal cortical hypodensities too small to characterize but likely cysts and unchanged. Ureters are within normal. Bladder is normal. Stomach/Bowel: Mild gastric distension. Progressive dilatation of several proximal small bowel loops including duodenum and jejunum in the right mid to upper abdomen. 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 No evidence of pneumatosis. There is a whirled appearance to the mesentery and SMA/ SMV unchanged. There is no evidence of a transition point. The dilated small bowel may be due to patient's ischemic episode versus possible internal hernia. Tiny amount of fluid in the mid mesentery. Appendix is normal in caliber with evidence of an appendicolith distally. There is moderate diverticulosis of the colon  and redundancy of the sigmoid colon. Vascular/Lymphatic: No pathologically enlarged lymph nodes.  Few nonspecific small lymph nodes in the gastrohepatic ligament unchanged. No evidence of abdominal aortic aneurysm. There is moderate calcified plaque over the abdominal aorta and iliac arteries. Again noted is interval resolution of the previously seen SMA thrombus. Reproductive: No mass or other significant abnormality. Other: Small umbilical hernia containing peritoneal fat unchanged. Small right inguinal hernia small oval 2.2 cm density over the right inguinal hernia which may represent complex fluid or hemorrhage. Musculoskeletal:  Minimal degenerate change of the spine and hips. IMPRESSION: Interval resolution of previously seen SMA thrombus. Progressive dilatation of the stomach, proximal small bowel including duodenum and proximal to mid jejunal loops in the mid to upper right abdomen. No evidence of pneumatosis. No transition point identified. Findings may be due to patient's ischemic episode versus partial small bowel obstruction due to internal hernia. Stable subtle heterogeneous low attenuation over the right lobe of the liver more focal on the delayed images and not significantly changed. Recommend attention to this area on follow-up as cannot exclude developing abscess in this patient with pain, elevated white blood cell count on recent ischemic episode. Few small sub cm renal cortical hypodensities too small to characterize, but likely cysts. Diverticulosis of the colon. Right inguinal hernia with 2.2 cm oval density which may be due to complex fluid/ hemorrhage. Small nonspecific lymph nodes over the gastrohepatic ligament unchanged. Electronically Signed   By: Marin Olp M.D.   On: 11/21/2015 13:58    Anti-infectives: Anti-infectives    Start     Dose/Rate Route Frequency Ordered Stop   11/23/15 0800  ciprofloxacin (CIPRO) tablet 500 mg     500 mg Oral 2 times daily 11/23/15 0657     11/23/15 0700  metroNIDAZOLE (FLAGYL) tablet 500 mg     500 mg Oral Every 8 hours 11/23/15 0657      11/21/15 1445  ciprofloxacin (CIPRO) IVPB 400 mg  Status:  Discontinued     400 mg 200 mL/hr over 60 Minutes Intravenous Every 12 hours 11/21/15 1444 11/23/15 0657   11/21/15 1445  metroNIDAZOLE (FLAGYL) IVPB 500 mg  Status:  Discontinued     500 mg 100 mL/hr over 60 Minutes Intravenous Every 8 hours 11/21/15 1444 11/23/15 0657   11/12/15 0200  ciprofloxacin (CIPRO) IVPB 400 mg  Status:  Discontinued     400 mg 200 mL/hr over 60 Minutes Intravenous Every 12 hours 11/11/15 1658 11/15/15 1248   11/12/15 0000  metroNIDAZOLE (FLAGYL) IVPB 500 mg  Status:  Discontinued     500 mg 100 mL/hr over 60 Minutes Intravenous Every 8 hours 11/11/15 1658 11/15/15 1248   11/11/15 1828  ciprofloxacin (CIPRO) 400 MG/200ML IVPB    Comments:  Sharen Heck: cabinet override      11/11/15 1828 11/12/15 0629   11/11/15 1615  ciprofloxacin (CIPRO) IVPB 400 mg     400 mg 200 mL/hr over 60 Minutes Intravenous  Once 11/11/15 1600 11/11/15 1715   11/11/15 1600  metroNIDAZOLE (FLAGYL) IVPB 500 mg     500 mg 100 mL/hr over 60 Minutes Intravenous  Once 11/11/15 1600 11/11/15 1710      Assessment/Plan:  70 year old male admitted for ischemic bowel. Recent leukocytosis has resolved with IV antibiotics. He is tolerating his diet, ambulate well, having no complaints. We will transition to oral antibiotics this morning. As long as he tolerates his oral medications without untoward event we will plan for discharge home later today.  Jamieson Lisa T. Adonis Huguenin, MD, FACS  11/23/2015

## 2015-11-23 NOTE — Care Management (Signed)
Patient to discharge today.  Patient has been ambulating and has no PT needs.  Patient tolerating diet.  Incision is closed and open to air. No skilled need for RN and discharge.  No RNCM needs identified.

## 2015-11-23 NOTE — Discharge Instructions (Signed)
Information on my medicine - XARELTO (Rivaroxaban)  This medication education was reviewed with me or my healthcare representative as part of my discharge preparation.  The pharmacist that spoke with me during my hospital stay was:  Ramond Dial, [facility]  Why was Xarelto prescribed for you? Xarelto was prescribed for you to reduce the risk of a blood clot forming that can cause a stroke if you have a medical condition called atrial fibrillation (a type of irregular heartbeat).  What do you need to know about xarelto ? Take your Xarelto ONCE DAILY at the same time every day with your evening meal. If you have difficulty swallowing the tablet whole, you may crush it and mix in applesauce just prior to taking your dose.  Take Xarelto exactly as prescribed by your doctor and DO NOT stop taking Xarelto without talking to the doctor who prescribed the medication.  Stopping without other stroke prevention medication to take the place of Xarelto may increase your risk of developing a clot that causes a stroke.  Refill your prescription before you run out.  After discharge, you should have regular check-up appointments with your healthcare provider that is prescribing your Xarelto.  In the future your dose may need to be changed if your kidney function or weight changes by a significant amount.  What do you do if you miss a dose? If you are taking Xarelto ONCE DAILY and you miss a dose, take it as soon as you remember on the same day then continue your regularly scheduled once daily regimen the next day. Do not take two doses of Xarelto at the same time or on the same day.   Important Safety Information A possible side effect of Xarelto is bleeding. You should call your healthcare provider right away if you experience any of the following: ? Bleeding from an injury or your nose that does not stop. ? Unusual colored urine (red or dark brown) or unusual colored stools (red or black). ? Unusual  bruising for unknown reasons. ? A serious fall or if you hit your head (even if there is no bleeding).  Some medicines may interact with Xarelto and might increase your risk of bleeding while on Xarelto. To help avoid this, consult your healthcare provider or pharmacist prior to using any new prescription or non-prescription medications, including herbals, vitamins, non-steroidal anti-inflammatory drugs (NSAIDs) and supplements.  This website has more information on Xarelto: [email or web address]  Acute Mesenteric Ischemia Acute mesenteric ischemia is a sudden restriction in blood supply to the intestines. This can be a life-threatening condition. CAUSES The condition may develop if something blocks a blood vessel that supports the intestines or causes it to narrow. It may also develop if blood flow to the intestines is reduced. Common causes of this condition include:  A blood clot.  Low blood pressure.  Heart disease.  Narrowing of the arteries from blood vessel (vascular) disease.  Shock.  Certain illegal and prescription drugs, such as cocaine or digoxin. RISK FACTORS This condition is more likely to develop in:  People who are over the age of 24 years.  People who have a history of coronary or vascular disease.  People who have an irregular heartbeat (arrhythmia).  People who smoke. SYMPTOMS Symptoms of this condition include:  Sudden, severe abdominal pain or bloating.  Blood in the stool.  Nausea.  Diarrhea that may be bloody.  Vomiting.  Fever. DIAGNOSIS This condition may be diagnosed with:  A medical history.  A physical  exam.  A surgical procedure to examine the organs inside the abdomen (exploratory laparotomy).  Tests, such as:  X-rays.  CT scans.  Blood tests.  Angiogram. This imaging test uses a dye to obtain a picture of blood flow to the intestine. TREATMENT This condition is almost always treated with an emergency surgery to correct  the problem or with a procedure that uses medicines to break up the clot.   This information is not intended to replace advice given to you by your health care provider. Make sure you discuss any questions you have with your health care provider.   Document Released: 11/25/2010 Document Revised: 12/27/2014 Document Reviewed: 06/01/2014 Elsevier Interactive Patient Education [employer].

## 2015-11-23 NOTE — Progress Notes (Signed)
Pt given dc instructions, carefully went over all medications with patient and family. They also will be monitoring pt's weight and cbg over the weekend and will call their PMD with any concerns. F/U is next Thursday and they verbalize understanding.  Dc home with RX and know other meds are at pharmacy. Dc home

## 2015-11-23 NOTE — Progress Notes (Signed)
[facility] [place] NAME: Alex Vance    MR#:  [id number]  DATE OF BIRTH:  1945-11-11  SUBJECTIVE:  CHIEF COMPLAINT:   Chief Complaint  Patient presents with  . Dizziness  . Abdominal Pain   - WBC normalized, on ABX for colitis - patient denies any complaints - BP is better now, possible discharge today  REVIEW OF SYSTEMS:  Review of Systems  Constitutional: Negative for chills, fever and malaise/fatigue.  HENT: Negative for ear discharge, ear pain and nosebleeds.   Eyes: Negative for blurred vision and double vision.  Respiratory: Negative for cough, shortness of breath and wheezing.   Cardiovascular: Negative for chest pain and palpitations.  Gastrointestinal: Negative for abdominal pain, constipation, diarrhea, nausea and vomiting.  Genitourinary: Negative for dysuria.  Neurological: Negative for dizziness, seizures and headaches.  Psychiatric/Behavioral: Negative for depression.    DRUG ALLERGIES:   Allergies  Allergen Reactions  . Penicillins Rash    Has patient had a PCN reaction causing immediate rash, facial/tongue/throat swelling, SOB or lightheadedness with hypotension: Yes Has patient had a PCN reaction causing severe rash involving mucus membranes or skin necrosis: No Has patient had a PCN reaction that required hospitalization No Has patient had a PCN reaction occurring within the last 10 years: No If all of the above answers are "NO", then may proceed with Cephalosporin use.    VITALS:  Blood pressure 122/67, pulse 75, temperature 98 F (36.7 C), temperature source Oral, resp. rate 18, height 6\' 2"  (1.88 m), weight 104.8 kg (231 lb 1.6 oz), SpO2 100 %.  PHYSICAL EXAMINATION:  Physical Exam  GENERAL:  70 y.o.-year-old patient lying in the bed with no acute distress.  EYES: Pupils equal, round, reactive to light and accommodation. No scleral icterus. Extraocular muscles intact.  HEENT: Head  atraumatic, normocephalic. Oropharynx and nasopharynx clear. NECK:  Supple, no jugular venous distention. No thyroid enlargement, no tenderness.  LUNGS: Normal breath sounds bilaterally, no wheezing, rales,rhonchi or crepitation. No use of accessory muscles of respiration.  CARDIOVASCULAR: S1, S2 normal. No murmurs, rubs, or gallops.  ABDOMEN:  soft today, , nontender, less distended. Slow Better bowel sounds. No organomegaly or mass.  EXTREMITIES: No pedal edema, cyanosis, or clubbing.  NEUROLOGIC: Cranial nerves II through XII are intact. Muscle strength 5/5 in all extremities. Sensation intact. Gait not checked.  PSYCHIATRIC: The patient is alert and oriented x 3.  SKIN: No obvious rash, lesion, or ulcer.    LABORATORY PANEL:   CBC  Recent Labs Lab 11/23/15 0456  WBC 10.8*  HGB 10.4*  HCT 31.3*  PLT 177   ------------------------------------------------------------------------------------------------------------------  Chemistries   Recent Labs Lab 11/20/15 0508  11/22/15 0545  NA 145  < > 138  K 3.3*  < > 3.9  CL 117*  < > 113*  CO2 21*  < > 19*  GLUCOSE 146*  < > 181*  BUN 11  < > 12  CREATININE 1.25*  < > 1.43*  CALCIUM 8.0*  < > 7.3*  MG 1.8  --   --   AST  --   --  22  ALT  --   --  19  ALKPHOS  --   --  80  BILITOT  --   --  0.9  < > = values in this interval not displayed. ------------------------------------------------------------------------------------------------------------------  Cardiac Enzymes No results for input(s): TROPONINI in the last 168 hours. ------------------------------------------------------------------------------------------------------------------  RADIOLOGY:  Dg Chest 2 View  Result Date: 11/21/2015 CLINICAL DATA:  White blood cell abnormality. Nonischemic cardiomyopathy. Ex-smoker. EXAM: CHEST  2 VIEW COMPARISON:  11/12/2015. FINDINGS: The cardiac silhouette is mildly enlarged with an interval decrease in size. Stable left  subclavian pacer and AICD leads. Clear lungs with normal vascularity. Unremarkable bones. IMPRESSION: No acute abnormality. Electronically Signed   By: Claudie Revering M.D.   On: 11/21/2015 13:13   Ct Abdomen Pelvis W Contrast  Result Date: 11/21/2015 CLINICAL DATA:  Generalized abdominal pain a few days. Recent small bowel obstruction. NG tube removal. Elevated white blood cell count. EXAM: CT ABDOMEN AND PELVIS WITH CONTRAST TECHNIQUE: Multidetector CT imaging of the abdomen and pelvis was performed using the standard protocol following bolus administration of intravenous contrast. CONTRAST:  142mL ISOVUE-300 IOPAMIDOL (ISOVUE-300) INJECTION 61% COMPARISON:  11/11/2015 FINDINGS: Lower chest: Lung bases demonstrate mild bibasilar dependent atelectasis. No effusion. Cardiac pacer leads over the heart. Calcified plaque over the coronary arteries. Mild cardiomegaly. Hepatobiliary: There is mild heterogeneous low attenuation over the periphery of the right lobe of the liver more focal on the delayed images without significant change from the prior exam. Gallbladder and biliary tree are within normal. Pancreas: No mass, inflammatory changes, or other significant abnormality. Spleen: Within normal limits in size and appearance. Adrenals/Urinary Tract: Adrenal glands are normal symmetric. Kidneys are normal in size without hydronephrosis or nephrolithiasis. J few small bilateral renal cortical hypodensities too small to characterize but likely cysts and unchanged. Ureters are within normal. Bladder is normal. Stomach/Bowel: Mild gastric distension. Progressive dilatation of several proximal small bowel loops including duodenum and jejunum in the right mid to upper abdomen. 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 No evidence of pneumatosis. There is a whirled appearance to the mesentery and SMA/ SMV unchanged. There is no evidence of a transition point. The dilated small bowel may be due to  patient's ischemic episode versus possible internal hernia. Tiny amount of fluid in the mid mesentery. Appendix is normal in caliber with evidence of an appendicolith distally. There is moderate diverticulosis of the colon and redundancy of the sigmoid colon. Vascular/Lymphatic: No pathologically enlarged lymph nodes. Few nonspecific small lymph nodes in the gastrohepatic ligament unchanged. No evidence of abdominal aortic aneurysm. There is moderate calcified plaque over the abdominal aorta and iliac arteries. Again noted is interval resolution of the previously seen SMA thrombus. Reproductive: No mass or other significant abnormality. Other: Small umbilical hernia containing peritoneal fat unchanged. Small right inguinal hernia small oval 2.2 cm density over the right inguinal hernia which may represent complex fluid or hemorrhage. Musculoskeletal:  Minimal degenerate change of the spine and hips. IMPRESSION: Interval resolution of previously seen SMA thrombus. Progressive dilatation of the stomach, proximal small bowel including duodenum and proximal to mid jejunal loops in the mid to upper right abdomen. No evidence of pneumatosis. No transition point identified. Findings may be due to patient's ischemic episode versus partial small bowel obstruction due to internal hernia. Stable subtle heterogeneous low attenuation over the right lobe of the liver more focal on the delayed images and not significantly changed. Recommend attention to this area on follow-up as cannot exclude developing abscess in this patient with pain, elevated white blood cell count on recent ischemic episode. Few small sub cm renal cortical hypodensities too small to characterize, but likely cysts. Diverticulosis of the colon. Right inguinal hernia with 2.2 cm oval density which may be due to complex fluid/ hemorrhage. Small nonspecific lymph nodes over the gastrohepatic ligament unchanged.  Electronically Signed   By: Marin Olp M.D.   On:  11/21/2015 13:58    EKG:   Orders placed or performed during the hospital encounter of 11/11/15  . ED EKG  . ED EKG  . EKG    ASSESSMENT AND PLAN:   70 year old male with past medical history significant for A. Fib, on compliant with Xarelto, hypertension, diabetes mellitus, neuropathy presents to hospital secondary to mesenteric ischemia.  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  -Patient is status post angiogram and angioplasty. -s/p SBO- s/p NG tube and removal of NG on 11/19/15 -tolerating diet -Management per surgical team - continue to monitor  #2 chronic systolic CHF-well compensated.  - lasix held due to hypotension  #3 type 2 diabetes mellitus- continue to hold his levemir and metformin as outpatient as well at discharge -sugars have been low normal in the hospital even after starting diet - low carb diet recommended and outpatient f/u in less than a week  #4 Leukocytosis- not sure what the cause is. Could be colitis as responding to ABX- cipro and flagyl - wbc normalised now.  Cultures negative so far - CXR with no PNA - Repeat CT abd with bowel dilation but no acute abnormalities,   #5 chronic A. fib-on Coreg- , PVCs noted. chronic. - back on xarelto- advised to f/u with his cardiologist in [facility]  #6 DVT prophylaxis-on xarelto   #7 HTN- hold his losartan, lasix and aldactone, only coreg can be continued due to hypotension Outpatient f/u recommended   OK for discharge from medical standpoint.   All the records are reviewed and case discussed with Care Management/Social Workerr. Management plans discussed with the patient, family and they are in agreement.  CODE STATUS: Full Code  TOTAL TIME TAKING CARE OF THIS PATIENT: 35 minutes.   POSSIBLE D/C TOMORROW, DEPENDING ON CLINICAL CONDITION.   Gladstone Lighter M.D on 11/23/2015 at 10:41 AM  Between 7am to  6pm - Pager - [phone]  After 6pm go to [email or web address] - password [email or web address] [facility]  [facility]  Office  [phone]  CC: Primary care physician; No primary care provider on file.

## 2015-11-26 LAB — CULTURE, BLOOD (ROUTINE X 2)
CULTURE: NO GROWTH
Culture: NO GROWTH

## 2015-11-29 ENCOUNTER — Inpatient Hospital Stay: Payer: Medicare Other | Admitting: Surgery

## 2015-11-29 ENCOUNTER — Telehealth: Payer: Self-pay | Admitting: Surgery

## 2015-11-29 NOTE — Telephone Encounter (Signed)
Called patient several times regarding his appointment today. Each time he stated he was waiting on his ride. He was a no show.

## 2018-04-03 ENCOUNTER — Encounter: Payer: Self-pay | Admitting: Emergency Medicine

## 2018-04-03 ENCOUNTER — Emergency Department: Payer: Medicare Other

## 2018-04-03 ENCOUNTER — Other Ambulatory Visit: Payer: Self-pay

## 2018-04-03 ENCOUNTER — Emergency Department
Admission: EM | Admit: 2018-04-03 | Discharge: 2018-04-03 | Disposition: A | Discharge status: Home / Self Care | Payer: Medicare Other | Attending: Emergency Medicine | Admitting: Emergency Medicine

## 2018-04-03 DIAGNOSIS — I509 Heart failure, unspecified: Secondary | ICD-10-CM | POA: Diagnosis not present

## 2018-04-03 DIAGNOSIS — E119 Type 2 diabetes mellitus without complications: Secondary | ICD-10-CM | POA: Insufficient documentation

## 2018-04-03 DIAGNOSIS — Z87891 Personal history of nicotine dependence: Secondary | ICD-10-CM | POA: Insufficient documentation

## 2018-04-03 DIAGNOSIS — Z79899 Other long term (current) drug therapy: Secondary | ICD-10-CM | POA: Diagnosis not present

## 2018-04-03 DIAGNOSIS — R0600 Dyspnea, unspecified: Secondary | ICD-10-CM | POA: Insufficient documentation

## 2018-04-03 DIAGNOSIS — Z95810 Presence of automatic (implantable) cardiac defibrillator: Secondary | ICD-10-CM | POA: Insufficient documentation

## 2018-04-03 DIAGNOSIS — Z7901 Long term (current) use of anticoagulants: Secondary | ICD-10-CM | POA: Insufficient documentation

## 2018-04-03 DIAGNOSIS — I110 Hypertensive heart disease with heart failure: Secondary | ICD-10-CM | POA: Diagnosis not present

## 2018-04-03 DIAGNOSIS — R2241 Localized swelling, mass and lump, right lower limb: Secondary | ICD-10-CM | POA: Diagnosis present

## 2018-04-03 LAB — CBC WITH DIFFERENTIAL/PLATELET
Abs Immature Granulocytes: 0.05 10*3/uL (ref 0.00–0.07)
BASOS ABS: 0 10*3/uL (ref 0.0–0.1)
Basophils Relative: 0 %
EOS PCT: 1 %
Eosinophils Absolute: 0.1 10*3/uL (ref 0.0–0.5)
HEMATOCRIT: 38.2 % — AB (ref 39.0–52.0)
HEMOGLOBIN: 12.3 g/dL — AB (ref 13.0–17.0)
IMMATURE GRANULOCYTES: 1 %
LYMPHS ABS: 1.6 10*3/uL (ref 0.7–4.0)
LYMPHS PCT: 21 %
MCH: 26.6 pg (ref 26.0–34.0)
MCHC: 32.2 g/dL (ref 30.0–36.0)
MCV: 82.7 fL (ref 80.0–100.0)
Monocytes Absolute: 0.9 10*3/uL (ref 0.1–1.0)
Monocytes Relative: 12 %
NEUTROS PCT: 65 %
NRBC: 0 % (ref 0.0–0.2)
Neutro Abs: 4.9 10*3/uL (ref 1.7–7.7)
Platelets: 278 10*3/uL (ref 150–400)
RBC: 4.62 MIL/uL (ref 4.22–5.81)
RDW: 17.2 % — AB (ref 11.5–15.5)
WBC: 7.6 10*3/uL (ref 4.0–10.5)

## 2018-04-03 LAB — COMPREHENSIVE METABOLIC PANEL
ALBUMIN: 3.9 g/dL (ref 3.5–5.0)
ALT: 23 U/L (ref 0–44)
ANION GAP: 6 (ref 5–15)
AST: 34 U/L (ref 15–41)
Alkaline Phosphatase: 125 U/L (ref 38–126)
BILIRUBIN TOTAL: 1.8 mg/dL — AB (ref 0.3–1.2)
BUN: 18 mg/dL (ref 8–23)
CHLORIDE: 108 mmol/L (ref 98–111)
CO2: 21 mmol/L — AB (ref 22–32)
Calcium: 8.6 mg/dL — ABNORMAL LOW (ref 8.9–10.3)
Creatinine, Ser: 1.26 mg/dL — ABNORMAL HIGH (ref 0.61–1.24)
GFR calc Af Amer: >60 mL/min (ref >60)
GFR calc non Af Amer: 57 mL/min — ABNORMAL LOW (ref >60)
GLUCOSE: 187 mg/dL — AB (ref 70–99)
POTASSIUM: 4 mmol/L (ref 3.5–5.1)
SODIUM: 135 mmol/L (ref 135–145)
TOTAL PROTEIN: 8.1 g/dL (ref 6.5–8.1)

## 2018-04-03 LAB — FIBRIN DERIVATIVES D-DIMER (ARMC ONLY): Fibrin derivatives D-dimer (ARMC): 3107.27 ng/mL (FEU) — ABNORMAL HIGH (ref 0.00–499.00)

## 2018-04-03 LAB — BRAIN NATRIURETIC PEPTIDE: B Natriuretic Peptide: 503 pg/mL — ABNORMAL HIGH (ref 0.0–100.0)

## 2018-04-03 LAB — TROPONIN I: Troponin I: 0.03 ng/mL (ref <0.03)

## 2018-04-03 MED ORDER — IOHEXOL 350 MG/ML SOLN
75.0000 mL | Freq: Once | INTRAVENOUS | Status: AC | PRN
  Administered 2018-04-03: 75 mL via INTRAVENOUS

## 2018-04-03 MED ORDER — FUROSEMIDE 20 MG PO TABS: 40.0000 mg | ORAL_TABLET | Freq: Every day | ORAL | 0 refills | Status: AC

## 2018-04-03 MED ORDER — POTASSIUM CHLORIDE ER 10 MEQ PO TBCR: 10.0000 meq | EXTENDED_RELEASE_TABLET | Freq: Every day | ORAL | 0 refills | Status: DC

## 2018-04-03 NOTE — ED Provider Notes (Signed)
[facility]  I accepted care from Dr. Rip Harbour ____________________________________________    LABS (pertinent positives/negatives)  I, Lisa Roca, MD have personally reviewed the lab reports noted below.  Labs Reviewed  COMPREHENSIVE METABOLIC PANEL - Abnormal; Notable for the following components:      Result Value   CO2 21 (*)    Glucose, Bld 187 (*)    Creatinine, Ser 1.26 (*)    Calcium 8.6 (*)    Total Bilirubin 1.8 (*)    GFR calc non Af Amer 57 (*)    All other components within normal limits  TROPONIN I - Abnormal; Notable for the following components:   Troponin I 0.03 (*)    All other components within normal limits  BRAIN NATRIURETIC PEPTIDE - Abnormal; Notable for the following components:   B Natriuretic Peptide 503.0 (*)    All other components within normal limits  CBC WITH DIFFERENTIAL/PLATELET - Abnormal; Notable for the following components:   Hemoglobin 12.3 (*)    HCT 38.2 (*)    RDW 17.2 (*)    All other components within normal limits  FIBRIN DERIVATIVES D-DIMER ([facility] ONLY) - Abnormal; Notable for the following components:   Fibrin derivatives D-dimer [facility]) 3,107.27 (*)    All other components within normal limits     ____________________________________________    RADIOLOGY All xrays were viewed by me. Imaging interpreted by radiologist.  I, Lisa Roca MD have personally reviewed the imaging report noted below.  Chest CT for PE:  IMPRESSION: 1. No evidence for acute pulmonary embolus. 2. Enlargement of the right heart with reflux of contrast material into the IVC and hepatic veins. Findings are compatible with passive venous congestion and may reflect right heart failure. 3. Abdominal ascites, new from previous exam. Etiology indeterminate. This could be related to right heart failure. Clinical correlation advised. 4. Aortic atherosclerosis and multi vessel coronary artery atherosclerotic calcifications including  left main disease.  ____________________________________________   PROCEDURES  Procedure(s) performed: None  Procedures  Critical Care performed: None  ____________________________________________   INITIAL IMPRESSION / ASSESSMENT AND PLAN / ED COURSE   Pertinent labs & imaging results that were available during my care of the patient were reviewed by me and considered in my medical decision making (see chart for details).   I accepted care for this patient who was awaiting laboratory studies and imaging.  Chest x-ray without significant pneumonia.  D-dimer was significantly elevated, I did discuss with patient unchanged chest CT and this was performed.  Chest CT shows no pulmonary embolism, but there are indicators of CHF.  Patient does have a history of CHF.  His BNP is around 500.  He is not on Lasix right now, I will prescribe him to do 4-day course with potassium supplementation and close follow-up with his primary care doctor.  I have given him a copy of his CT results that shows atherosclerosis, as well as ascites, things that can be followed up with his primary care doctor.    CONSULTATIONS: None    Patient / Family / Caregiver informed of clinical course, medical decision-making process, and agree with plan.   I discussed return precautions, follow-up instructions, and discharged instructions with patient and/or family.   Discharge instructions:  Your evaluated for shortness of breath and also have leg swelling, and as we discussed you appear to be having congestive heart failure exacerbation.  Take the Lasix with the potassium supplement for the next 4 days.  Return to the emergency  department immediately for any worsening condition including trouble breathing, shortness of breath, chest pain, dizziness or passing out, or any other symptoms concerning to you.  Please follow-up with your primary care doctor this  week.   ____________________________________________   FINAL CLINICAL IMPRESSION(S) / ED DIAGNOSES  Final diagnoses:  Dyspnea, unspecified type  Congestive heart failure, unspecified HF chronicity, unspecified heart failure type ([place])        Lisa Roca, MD 04/03/18 1044

## 2018-04-03 NOTE — ED Provider Notes (Signed)
[facility] Emergency Department Provider Note   ____________________________________________   First MD Initiated Contact with Patient 04/03/18 [phone]     (approximate)  I have reviewed the triage vital signs and the nursing notes.   HISTORY  Chief Complaint Shortness of Breath    HPI Alex Vance is a 72 y.o. male patient complains of right lower leg swelling and pain it comes and goes.  When it comes it is hard to walk on it.  Right now was not hurting much.  He also complains of shortness of breath is been there for a long time but apparently has gotten worse tonight.  He does not endorse any chest pain.  He is breathing hard.  Past Medical History:  Diagnosis Date  . Chronic atrial fibrillation    a. on Xarelto  . Diabetes mellitus without complication ([place])   . Hyperlipemia   . Hypertension   . Nonischemic cardiomyopathy [facility])    a. s/p [employer] ICD placement 2008, generator change 11/2013  b. Echo 2015: EF 30-35%    Patient Active Problem List   Diagnosis Date Noted  . Protein-calorie malnutrition, severe 11/20/2015  . CHF (congestive heart failure) ([place])   . Postoperative ileus ([place])   . 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 Dilated cardiomyopathy ([place]) 11/11/2015  . Type II diabetes mellitus with complication ([place]) 83/41/9622  . Chronic systolic CHF (congestive heart failure) ([place]) 11/11/2015  . HTN, goal below 140/80 11/11/2015  . Chronic atrial fibrillation 11/11/2015    Past Surgical History:  Procedure Laterality Date  . CARDIAC DEFIBRILLATOR PLACEMENT    . PERIPHERAL VASCULAR CATHETERIZATION N/A 11/11/2015   Procedure: Visceral Artery Intervention;  Surgeon: Algernon Huxley, MD;  Location: [facility];  Service: Cardiovascular;  Laterality: N/A;    Prior to Admission medications   Medication Sig Start Date End Date Taking? Authorizing Provider  carvedilol  (COREG) 12.5 MG tablet Take 1 tablet (12.5 mg total) by mouth 2 (two) times daily with a meal. 11/23/15   Gladstone Lighter, MD  ciprofloxacin (CIPRO) 500 MG tablet Take 1 tablet (500 mg total) by mouth 2 (two) times daily. 11/23/15   Clayburn Pert, MD  gabapentin (NEURONTIN) 800 MG tablet Take 800 mg by mouth daily as needed (for pain.).  08/22/15   [provider]  HYDROcodone-acetaminophen (NORCO/VICODIN) 5-325 MG tablet Take 1-2 tablets by mouth every 6 (six) hours as needed for moderate pain or severe pain. 11/23/15   Clayburn Pert, MD  metroNIDAZOLE (FLAGYL) 500 MG tablet Take 1 tablet (500 mg total) by mouth every 8 (eight) hours. 11/23/15   Clayburn Pert, MD  omeprazole (PRILOSEC) 20 MG capsule Take 20 mg by mouth daily. 09/24/15   [provider]  simvastatin (ZOCOR) 20 MG tablet Take 20 mg by mouth at bedtime.    [provider]  XARELTO 20 MG TABS tablet Take 20 mg by mouth at bedtime. 09/24/15   [provider]    Allergies Penicillins  Family History  Problem Relation Age of Onset  . Diabetes Father     Social History Social History   Tobacco Use  . Smoking status: Former Research scientist (life sciences)  . Smokeless tobacco: Never Used  Substance Use Topics  . Alcohol use: No  . Drug use: No    Review of Systems  Constitutional: No fever/chills Eyes: No visual changes. ENT: No sore throat. Cardiovascular: Denies chest pain. Respiratory:shortness of breath.  Gastrointestinal: No abdominal pain.  No nausea, no vomiting.  No diarrhea.  No constipation. Genitourinary: Negative for dysuria. Musculoskeletal: Negative for back pain. Skin: Negative for rash. Neurological: Negative for headaches, focal weakness or numbness.   ____________________________________________   PHYSICAL EXAM:  VITAL SIGNS: ED Triage Vitals  Enc Vitals Group     BP --      Pulse --      Resp --      Temp --      Temp src --      SpO2 --      Weight 04/03/18 0647 230 lb  (104.3 kg)     Height 04/03/18 0647 6\' 2"  (1.88 m)     Head Circumference --      Peak Flow --      Pain Score 04/03/18 0646 10     Pain Loc --      Pain Edu? --      Excl. in [place]? --     Constitutional: Alert and oriented.  Uncomfortable appearing and in mild acute distress. Eyes: Conjunctivae are normal.  Head: Atraumatic. Nose: No congestion/rhinnorhea. Mouth/Throat: Mucous membranes are moist.  Oropharynx non-erythematous. Neck: No stridor.  Cardiovascular: Rapid rate, regular rhythm. Grossly normal heart sounds.  Good peripheral circulation. Respiratory: Normal respiratory effort.  No retractions. Lungs CTAB. Gastrointestinal: Soft and nontender. No distention. No abdominal bruits. No CVA tenderness. Musculoskeletal: No lower extremity tenderness bilateral edema worse on the right there appear to be some distended veins in the calf as well.. Neurologic:  Normal speech and language. No gross focal neurologic deficits are appreciated.  Skin:  Skin is warm, dry and intact. No rash noted. Psychiatric: Mood and affect are normal. Speech and behavior are normal.  ____________________________________________   LABS (all labs ordered are listed, but only abnormal results are displayed)  Labs Reviewed  CBC WITH DIFFERENTIAL/PLATELET - Abnormal; Notable for the following components:      Result Value   Hemoglobin 12.3 (*)    HCT 38.2 (*)    RDW 17.2 (*)    All other components within normal limits  COMPREHENSIVE METABOLIC PANEL  TROPONIN I  BRAIN NATRIURETIC PEPTIDE  FIBRIN DERIVATIVES D-DIMER ([facility] ONLY)   ____________________________________________  EKG  EKG read interpreted by me shows a somewhat irregular rate just over 100 difficult to tell the axis is the complexes are so low in amplitude I do not see any acute ST-T wave changes ____________________________________________  RADIOLOGY  ED MD interpretation:    Official radiology report(s): No results  found.  ____________________________________________   PROCEDURES  Procedure(s) performed:   Procedures  Critical Care performed:   ____________________________________________   INITIAL IMPRESSION / ASSESSMENT AND PLAN / ED COURSE  Labs are pending I will sign the patient out to Dr. Reita Cliche.         ____________________________________________   FINAL CLINICAL IMPRESSION(S) / ED DIAGNOSES  Final diagnoses:  Dyspnea, unspecified type     ED Discharge Orders    None       Note:  This document was prepared using Dragon voice recognition software and may include unintentional dictation errors.    Nena Polio, MD 04/03/18 [phone]

## 2018-04-03 NOTE — ED Notes (Signed)
Esign not working at this time. Pt verbalized discharge instructions and has no questions at this time. 

## 2018-04-03 NOTE — Discharge Instructions (Signed)
Your evaluated for shortness of breath and also have leg swelling, and as we discussed you appear to be having congestive heart failure exacerbation.  Take the Lasix with the potassium supplement for the next 4 days.  Return to the emergency department immediately for any worsening condition including trouble breathing, shortness of breath, chest pain, dizziness or passing out, or any other symptoms concerning to you.  Please follow-up with your primary care doctor this week.

## 2018-04-03 NOTE — ED Notes (Signed)
Patient transported to CT 

## 2018-04-03 NOTE — ED Triage Notes (Signed)
Pt arrives POV to triage with c/o right leg pain and SOB. Pt is experiencing labored breathing at this time.

## 2018-06-14 ENCOUNTER — Emergency Department: Payer: Medicare Other

## 2018-06-14 ENCOUNTER — Inpatient Hospital Stay
Admission: EM | Admit: 2018-06-14 | Discharge: 2018-07-21 | DRG: 870 | Disposition: E | Payer: Medicare Other | Attending: Internal Medicine | Admitting: Internal Medicine

## 2018-06-14 ENCOUNTER — Other Ambulatory Visit: Payer: Self-pay

## 2018-06-14 DIAGNOSIS — N433 Hydrocele, unspecified: Secondary | ICD-10-CM | POA: Diagnosis present

## 2018-06-14 DIAGNOSIS — E870 Hyperosmolality and hypernatremia: Secondary | ICD-10-CM | POA: Diagnosis present

## 2018-06-14 DIAGNOSIS — I428 Other cardiomyopathies: Secondary | ICD-10-CM | POA: Diagnosis present

## 2018-06-14 DIAGNOSIS — E118 Type 2 diabetes mellitus with unspecified complications: Secondary | ICD-10-CM | POA: Diagnosis present

## 2018-06-14 DIAGNOSIS — L89312 Pressure ulcer of right buttock, stage 2: Secondary | ICD-10-CM | POA: Diagnosis present

## 2018-06-14 DIAGNOSIS — Z7189 Other specified counseling: Secondary | ICD-10-CM

## 2018-06-14 DIAGNOSIS — Z515 Encounter for palliative care: Secondary | ICD-10-CM

## 2018-06-14 DIAGNOSIS — G936 Cerebral edema: Secondary | ICD-10-CM | POA: Diagnosis present

## 2018-06-14 DIAGNOSIS — N170 Acute kidney failure with tubular necrosis: Secondary | ICD-10-CM | POA: Diagnosis present

## 2018-06-14 DIAGNOSIS — I5022 Chronic systolic (congestive) heart failure: Secondary | ICD-10-CM | POA: Diagnosis present

## 2018-06-14 DIAGNOSIS — A419 Sepsis, unspecified organism: Secondary | ICD-10-CM | POA: Diagnosis present

## 2018-06-14 DIAGNOSIS — Z79899 Other long term (current) drug therapy: Secondary | ICD-10-CM

## 2018-06-14 DIAGNOSIS — Z880 Allergy status to penicillin: Secondary | ICD-10-CM

## 2018-06-14 DIAGNOSIS — Z95810 Presence of automatic (implantable) cardiac defibrillator: Secondary | ICD-10-CM

## 2018-06-14 DIAGNOSIS — R6520 Severe sepsis without septic shock: Secondary | ICD-10-CM | POA: Diagnosis present

## 2018-06-14 DIAGNOSIS — I482 Chronic atrial fibrillation, unspecified: Secondary | ICD-10-CM | POA: Diagnosis present

## 2018-06-14 DIAGNOSIS — I609 Nontraumatic subarachnoid hemorrhage, unspecified: Secondary | ICD-10-CM | POA: Diagnosis present

## 2018-06-14 DIAGNOSIS — Z66 Do not resuscitate: Secondary | ICD-10-CM | POA: Diagnosis not present

## 2018-06-14 DIAGNOSIS — I639 Cerebral infarction, unspecified: Secondary | ICD-10-CM | POA: Diagnosis present

## 2018-06-14 DIAGNOSIS — I130 Hypertensive heart and chronic kidney disease with heart failure and stage 1 through stage 4 chronic kidney disease, or unspecified chronic kidney disease: Secondary | ICD-10-CM | POA: Diagnosis present

## 2018-06-14 DIAGNOSIS — N5089 Other specified disorders of the male genital organs: Secondary | ICD-10-CM | POA: Diagnosis present

## 2018-06-14 DIAGNOSIS — J9602 Acute respiratory failure with hypercapnia: Secondary | ICD-10-CM | POA: Diagnosis present

## 2018-06-14 DIAGNOSIS — N183 Chronic kidney disease, stage 3 (moderate): Secondary | ICD-10-CM | POA: Diagnosis present

## 2018-06-14 DIAGNOSIS — R509 Fever, unspecified: Secondary | ICD-10-CM | POA: Diagnosis present

## 2018-06-14 DIAGNOSIS — K7200 Acute and subacute hepatic failure without coma: Secondary | ICD-10-CM | POA: Diagnosis present

## 2018-06-14 DIAGNOSIS — Z7901 Long term (current) use of anticoagulants: Secondary | ICD-10-CM

## 2018-06-14 DIAGNOSIS — Z794 Long term (current) use of insulin: Secondary | ICD-10-CM

## 2018-06-14 DIAGNOSIS — N390 Urinary tract infection, site not specified: Secondary | ICD-10-CM | POA: Diagnosis present

## 2018-06-14 DIAGNOSIS — I10 Essential (primary) hypertension: Secondary | ICD-10-CM | POA: Diagnosis present

## 2018-06-14 DIAGNOSIS — N179 Acute kidney failure, unspecified: Secondary | ICD-10-CM | POA: Diagnosis present

## 2018-06-14 DIAGNOSIS — Z87891 Personal history of nicotine dependence: Secondary | ICD-10-CM

## 2018-06-14 DIAGNOSIS — L8990 Pressure ulcer of unspecified site, unspecified stage: Secondary | ICD-10-CM

## 2018-06-14 DIAGNOSIS — E1122 Type 2 diabetes mellitus with diabetic chronic kidney disease: Secondary | ICD-10-CM | POA: Diagnosis present

## 2018-06-14 DIAGNOSIS — R29740 NIHSS score 40: Secondary | ICD-10-CM | POA: Diagnosis present

## 2018-06-14 DIAGNOSIS — N430 Encysted hydrocele: Secondary | ICD-10-CM

## 2018-06-14 DIAGNOSIS — I69320 Aphasia following cerebral infarction: Secondary | ICD-10-CM | POA: Diagnosis not present

## 2018-06-14 DIAGNOSIS — N189 Chronic kidney disease, unspecified: Secondary | ICD-10-CM

## 2018-06-14 DIAGNOSIS — L89322 Pressure ulcer of left buttock, stage 2: Secondary | ICD-10-CM | POA: Diagnosis present

## 2018-06-14 DIAGNOSIS — Z833 Family history of diabetes mellitus: Secondary | ICD-10-CM

## 2018-06-14 DIAGNOSIS — J9601 Acute respiratory failure with hypoxia: Secondary | ICD-10-CM | POA: Diagnosis present

## 2018-06-14 DIAGNOSIS — N185 Chronic kidney disease, stage 5: Secondary | ICD-10-CM | POA: Diagnosis not present

## 2018-06-14 DIAGNOSIS — E785 Hyperlipidemia, unspecified: Secondary | ICD-10-CM | POA: Diagnosis present

## 2018-06-14 DIAGNOSIS — R9401 Abnormal electroencephalogram [EEG]: Secondary | ICD-10-CM | POA: Diagnosis present

## 2018-06-14 DIAGNOSIS — Z931 Gastrostomy status: Secondary | ICD-10-CM

## 2018-06-14 DIAGNOSIS — J9600 Acute respiratory failure, unspecified whether with hypoxia or hypercapnia: Secondary | ICD-10-CM

## 2018-06-14 LAB — COMPREHENSIVE METABOLIC PANEL
ALT: 260 U/L — ABNORMAL HIGH (ref 0–44)
AST: 194 U/L — ABNORMAL HIGH (ref 15–41)
Albumin: 2.4 g/dL — ABNORMAL LOW (ref 3.5–5.0)
Alkaline Phosphatase: 156 U/L — ABNORMAL HIGH (ref 38–126)
Anion gap: 8 (ref 5–15)
BUN: 162 mg/dL — ABNORMAL HIGH (ref 8–23)
CO2: 23 mmol/L (ref 22–32)
Calcium: 8 mg/dL — ABNORMAL LOW (ref 8.9–10.3)
Chloride: 128 mmol/L — ABNORMAL HIGH (ref 98–111)
Creatinine, Ser: 3.47 mg/dL — ABNORMAL HIGH (ref 0.61–1.24)
GFR calc Af Amer: 20 mL/min — ABNORMAL LOW (ref >60)
GFR, EST NON AFRICAN AMERICAN: 17 mL/min — AB (ref >60)
Glucose, Bld: 482 mg/dL — ABNORMAL HIGH (ref 70–99)
Potassium: 5.7 mmol/L — ABNORMAL HIGH (ref 3.5–5.1)
Sodium: 159 mmol/L — ABNORMAL HIGH (ref 135–145)
Total Bilirubin: 0.4 mg/dL (ref 0.3–1.2)
Total Protein: 6.2 g/dL — ABNORMAL LOW (ref 6.5–8.1)

## 2018-06-14 LAB — BLOOD GAS, ARTERIAL
Acid-base deficit: 4 mmol/L — ABNORMAL HIGH (ref 0.0–2.0)
Acid-base deficit: 4.9 mmol/L — ABNORMAL HIGH (ref 0.0–2.0)
Bicarbonate: 19.8 mmol/L — ABNORMAL LOW (ref 20.0–28.0)
Bicarbonate: 20.6 mmol/L (ref 20.0–28.0)
FIO2: 0.4
FIO2: 0.4
MECHVT: 500 mL
MECHVT: 500 mL
Mechanical Rate: 16
O2 SAT: 83.6 %
O2 Saturation: 99.3 %
PCO2 ART: 32 mmHg (ref 32.0–48.0)
PEEP: 5 cmH2O
PEEP: 5 cmH2O
Patient temperature: 37
Patient temperature: 37
RATE: 16 resp/min
pCO2 arterial: 39 mmHg (ref 32.0–48.0)
pH, Arterial: 7.4 (ref 7.350–7.450)
pH, Arterial: 7.33 — ABNORMAL LOW (ref 7.350–7.450)
pO2, Arterial: 150 mmHg — ABNORMAL HIGH (ref 83.0–108.0)
pO2, Arterial: 52 mmHg — ABNORMAL LOW (ref 83.0–108.0)

## 2018-06-14 LAB — URINALYSIS, ROUTINE W REFLEX MICROSCOPIC
Glucose, UA: NEGATIVE mg/dL
Ketones, ur: 5 mg/dL — AB
Nitrite: NEGATIVE
Protein, ur: NEGATIVE mg/dL
Specific Gravity, Urine: 1.021 (ref 1.005–1.030)
WBC, UA: >50 WBC/hpf — ABNORMAL HIGH (ref 0–5)
pH: 5 (ref 5.0–8.0)

## 2018-06-14 LAB — PROTIME-INR
INR: 1.3
Prothrombin Time: 16.4 seconds — ABNORMAL HIGH (ref 11.4–15.2)

## 2018-06-14 LAB — CBC WITH DIFFERENTIAL/PLATELET
Abs Immature Granulocytes: 0.15 10*3/uL — ABNORMAL HIGH (ref 0.00–0.07)
Basophils Absolute: 0.1 10*3/uL (ref 0.0–0.1)
Basophils Relative: 1 %
EOS ABS: 0.2 10*3/uL (ref 0.0–0.5)
Eosinophils Relative: 1 %
HCT: 40.4 % (ref 39.0–52.0)
Hemoglobin: 12.2 g/dL — ABNORMAL LOW (ref 13.0–17.0)
Immature Granulocytes: 1 %
Lymphocytes Relative: 8 %
Lymphs Abs: 1.8 10*3/uL (ref 0.7–4.0)
MCH: 24.9 pg — AB (ref 26.0–34.0)
MCHC: 30.2 g/dL (ref 30.0–36.0)
MCV: 82.4 fL (ref 80.0–100.0)
MONO ABS: 1.5 10*3/uL — AB (ref 0.1–1.0)
Monocytes Relative: 6 %
Neutro Abs: 19.8 10*3/uL — ABNORMAL HIGH (ref 1.7–7.7)
Neutrophils Relative %: 83 %
Platelets: 252 10*3/uL (ref 150–400)
RBC: 4.9 MIL/uL (ref 4.22–5.81)
RDW: 20.3 % — AB (ref 11.5–15.5)
WBC: 23.5 10*3/uL — ABNORMAL HIGH (ref 4.0–10.5)
nRBC: 0 % (ref 0.0–0.2)

## 2018-06-14 LAB — GLUCOSE, CAPILLARY: Glucose-Capillary: 379 mg/dL — ABNORMAL HIGH (ref 70–99)

## 2018-06-14 LAB — LACTIC ACID, PLASMA
Lactic Acid, Venous: 2.8 mmol/L (ref 0.5–1.9)
Lactic Acid, Venous: 2.4 mmol/L (ref 0.5–1.9)

## 2018-06-14 MED ORDER — SODIUM CHLORIDE 0.9 % IV BOLUS
500.0000 mL | Freq: Once | INTRAVENOUS | Status: AC
  Administered 2018-06-14: 500 mL via INTRAVENOUS

## 2018-06-14 MED ORDER — SODIUM CHLORIDE 0.9 % IV BOLUS (SEPSIS)
1000.0000 mL | Freq: Once | INTRAVENOUS | Status: AC
  Administered 2018-06-14: 1000 mL via INTRAVENOUS

## 2018-06-14 MED ORDER — METRONIDAZOLE IN NACL 5-0.79 MG/ML-% IV SOLN
500.0000 mg | Freq: Three times a day (TID) | INTRAVENOUS | Status: DC
  Administered 2018-06-14 – 2018-06-15 (×3): 500 mg via INTRAVENOUS
  Filled 2018-06-14 (×6): qty 100

## 2018-06-14 MED ORDER — LEVETIRACETAM IN NACL 1000 MG/100ML IV SOLN
1000.0000 mg | Freq: Once | INTRAVENOUS | Status: AC
  Administered 2018-06-14: 1000 mg via INTRAVENOUS
  Filled 2018-06-14: qty 100

## 2018-06-14 MED ORDER — VANCOMYCIN HCL 10 G IV SOLR: 1500.0000 mg | INTRAVENOUS | Status: DC

## 2018-06-14 MED ORDER — VANCOMYCIN HCL IN DEXTROSE 1-5 GM/200ML-% IV SOLN
1000.0000 mg | Freq: Once | INTRAVENOUS | Status: AC
  Administered 2018-06-14: 1000 mg via INTRAVENOUS
  Filled 2018-06-14: qty 200

## 2018-06-14 MED ORDER — EMPTY CONTAINERS FLEXIBLE MISC
900.0000 mg | Freq: Once | Status: DC
  Filled 2018-06-14: qty 90

## 2018-06-14 MED ORDER — NOREPINEPHRINE 4 MG/250ML-% IV SOLN
0.0000 ug/min | INTRAVENOUS | Status: DC
  Administered 2018-06-14: 2 ug/min via INTRAVENOUS
  Administered 2018-06-16: 6 ug/min via INTRAVENOUS
  Filled 2018-06-14 (×2): qty 250

## 2018-06-14 MED ORDER — NALOXONE HCL 2 MG/2ML IJ SOSY
1.0000 mg | PREFILLED_SYRINGE | Freq: Once | INTRAMUSCULAR | Status: AC
  Administered 2018-06-14: 1 mg via INTRAVENOUS

## 2018-06-14 MED ORDER — NALOXONE HCL 2 MG/2ML IJ SOSY
PREFILLED_SYRINGE | INTRAMUSCULAR | Status: AC
  Administered 2018-06-14: 1 mg via INTRAVENOUS
  Filled 2018-06-14: qty 2

## 2018-06-14 MED ORDER — SODIUM CHLORIDE 0.9 % IV SOLN
1.0000 g | Freq: Three times a day (TID) | INTRAVENOUS | Status: DC
  Administered 2018-06-15: 1 g via INTRAVENOUS
  Filled 2018-06-14 (×3): qty 1

## 2018-06-14 MED ORDER — SODIUM CHLORIDE 0.9 % IV SOLN
2.0000 g | Freq: Once | INTRAVENOUS | Status: AC
  Administered 2018-06-14: 2 g via INTRAVENOUS
  Filled 2018-06-14: qty 2

## 2018-06-14 NOTE — Consult Note (Signed)
H&P Physician requesting consult: Alex Crumb, MD  Chief Complaint: Right scrotal swelling  History of Present Illness: 73 year old male with complicated medical history.  He was recently discharged from [facility] after having a PEG tube placed.  He has a history of stroke and is a phasic and nonambulatory.  Patient was transferred to the emergency department due to unresponsiveness.  He was recently intubated.  There is concern for sepsis.  At [facility], he had a scrotal ultrasound as well as CT scan performed which revealed a 16 cm complex heterogeneous cyst favored to represent a hemorrhagic hydrocele/complex hydrocele versus mass versus abscess.  He is currently febrile with hypotension and tachycardia.  Past Medical History:  Diagnosis Date  . Chronic atrial fibrillation    a. on Xarelto  . Diabetes mellitus without complication ([place])   . Hyperlipemia   . Hypertension   . Nonischemic cardiomyopathy [facility])    a. s/p [employer] ICD placement 2008, generator change 11/2013  b. Echo 2015: EF 30-35%   Past Surgical History:  Procedure Laterality Date  . CARDIAC DEFIBRILLATOR PLACEMENT    . PERIPHERAL VASCULAR CATHETERIZATION N/A 11/11/2015   Procedure: Visceral Artery Intervention;  Surgeon: Alex Huxley, MD;  Location: [place] [facility];  Service: Cardiovascular;  Laterality: N/A;    Home Medications:  (Not in a hospital admission)  Allergies:  Allergies  Allergen Reactions  . Penicillins Rash    Has patient had a PCN reaction causing immediate rash, facial/tongue/throat swelling, SOB or lightheadedness with hypotension: Yes Has patient had a PCN reaction causing severe rash involving mucus membranes or skin necrosis: No Has patient had a PCN reaction that required hospitalization No Has patient had a PCN reaction occurring within the last 10 years: No If all of the above answers are "NO", then may proceed with Cephalosporin use.    Family History  Problem Relation Age of Onset   . Diabetes Father    Social History:  reports that he has quit smoking. He has never used smokeless tobacco. He reports that he does not drink alcohol or use drugs.  ROS: A complete review of systems was performed.  All systems are negative except for pertinent findings as noted. ROS   Physical Exam:  Vital signs in last 24 hours: Temp:  [103.2 F (39.6 C)] 103.2 F (39.6 C) (02/24 1741) Pulse Rate:  [34-111] 34 (02/24 1830) Resp:  [27-40] 27 (02/24 1830) BP: (88-110)/(47-70) 88/47 (02/24 1830) SpO2:  [83 %-100 %] 100 % (02/24 1830) Weight:  [90.7 kg] 90.7 kg (02/24 1736) General: Intubated, sedated Genitourinary: Uncircumcised phallus.  He has phimosis.  Three-way Foley catheter in place draining clear yellow urine.  Right hemiscrotal swelling consistent with complex hydrocele.  No overlying erythema, crepitus, fluctuance to suggest infection.  Laboratory Data:  Results for orders placed or performed during the hospital encounter of 06/11/2018 (from the past 24 hour(s))  Glucose, capillary     Status: Abnormal   Collection Time: 05/26/2018  5:54 PM  Result Value Ref Range   Glucose-Capillary 379 (H) 70 - 99 mg/dL   No results found for this or any previous visit (from the past 240 hour(s)). Creatinine: No results for input(s): CREATININE in the last 168 hours.  Impression/Assessment:  Right scrotal hydrocele Sepsis of unknown etiology  Plan:  Low suspicion for intrascrotal pathology contributing to his condition though if he does not improve, can consider repeat CT.  He is currently unstable and therefore no intervention necessary at this time.  Agree that this is likely chronic.  Agree with current management including ICU admission and investigation to causes of his sepsis.  Blood culture and urine cultures have been sent.  Alex Vance, III 06/03/2018, 6:35 PM

## 2018-06-14 NOTE — Progress Notes (Signed)
ABG drawn, RT believe sample to be a mixed sample. PH 7.33, PCO2 39, pO 52, HCO3 20.6. SPO2 remains 98-100% throughout shift. Previous ABG PO2 138mmHg.

## 2018-06-14 NOTE — ED Notes (Signed)
Pt intubated at this time. 26 at lip. MD McShane. Good color change

## 2018-06-14 NOTE — ED Provider Notes (Signed)
-----------------------------------------   11:07 PM on 06/18/2018 -----------------------------------------  Patient care assumed from Dr. Burlene Arnt.  Patient's family has arrived.  I had a long discussion with all the family members present, discussed the patient's significant illness including UTI, sepsis, metabolic abnormalities, CVA with hemorrhagic conversion.  Had a long discussion regarding the patient's very poor prognosis.  Family has had time to process and discuss among each other their wishes.  They would like to admit the patient to the hospital but continue the patient on a ventilator until the remainder of the family can come say their goodbyes tomorrow, at which time they would then convert to comfort care measures.  We will continue on the patient's current treatment however we will not escalate treatment I have canceled the patient's reversal medications given the planned conversion to comfort care as well as cost and futility of medication.  Family agreeable to plan of care.  Dr. Jannifer Franklin will be admitting.   Harvest Dark, MD 06/06/2018 [phone]

## 2018-06-14 NOTE — Progress Notes (Signed)
Pt transported to CT on Vent and returned to ED 16 without incident. Pt remains on the vent and is tol well at this time.

## 2018-06-14 NOTE — ED Notes (Signed)
Respiratory called to transport pt to CT

## 2018-06-14 NOTE — ED Notes (Signed)
Date and time results received: 06/12/2018 2207 (use smartphrase ".now" to insert current time)  Test: lactic Critical Value: 2.4  Name of Provider Notified: Paduchowski  Orders Received? Or Actions Taken?: Orders Received - See Orders for details

## 2018-06-14 NOTE — ED Notes (Signed)
Pt transported to CT ?

## 2018-06-14 NOTE — ED Notes (Signed)
Family at bedside. MD aware

## 2018-06-14 NOTE — Consult Note (Signed)
CODE SEPSIS - PHARMACY COMMUNICATION  **Broad Spectrum Antibiotics should be administered within 1 hour of Sepsis diagnosis**  Time Code Sepsis Called/Page Received: 1801  Antibiotics Ordered: Vancomycin/Aztreonam  Time of 1st antibiotic administration: ~1835  Additional action taken by pharmacy: Spoke with nurse Aldona Bar?) who had a faulty bag of Aztreonam - offered to get another bag after short look trying to see if pt had experience with a cephalosporin - none was found, so pullled a second bag of aztreonam from [facility] for nurse   If necessary, Name of Provider/Nurse Contacted:     Lu Duffel ,PharmD Clinical Pharmacist  06/06/2018  6:14 PM

## 2018-06-14 NOTE — ED Notes (Signed)
100 roc in by [employer]

## 2018-06-14 NOTE — ED Triage Notes (Signed)
EMS arrives with pt from [facility]. EMS was called out for abdnormal labs and arrived to find pt unresponsive. Facility stated pt had been like that for 2 days.  Pt is not responding to even sternal rub per EMS. EMS reports vitals-BP-117/64, 90% RA, ax temp-100.3, CO2-33, RR-28, BS-519, SR.  Pt has feeding tube. Pt is not responding to voice or sternal rub at this time.

## 2018-06-14 NOTE — H&P (Signed)
[place] [facility] [place] NAME: Alex Vance    MR#:  [id number]  DATE OF BIRTH:  1945-06-18  DATE OF ADMISSION:  06/13/2018  PRIMARY CARE PHYSICIAN: System, Pcp Not In   REQUESTING/REFERRING PHYSICIAN: Burlene Arnt, MD  CHIEF COMPLAINT:   Chief Complaint  Patient presents with  . unresponsive    HISTORY OF PRESENT ILLNESS:  Alex Vance  is a 73 y.o. male who presents with chief complaint as above.  Patient brought to the ED due to unresponsiveness.  He was recently discharged from [facility] after having significant stroke and a long stay pending with the patient being completely aphasic, and nonambulatory.  He had PEG tube placed.  Today he was brought to the ED because he was unresponsive and family found that his glucose was elevated.  Here in the ED evaluation shows a new stroke with early hemorrhagic transformation.  Patient has chronic A. fib and was on Xarelto.  Reversal agents were given in the ED.  Given his mental status he required intubation.  He does have a urinary tract infection and meets criteria for severe sepsis.  Hospitalist were called for admission.  Long conversation had with family about goals of care given his baseline functioning status.  Family wants the patient to be full code with full treatment at this time, though they are continuing to discuss his long-term goals of care once more family is able to arrive.  PAST MEDICAL HISTORY:   Past Medical History:  Diagnosis Date  . Chronic atrial fibrillation    a. on Xarelto  . Diabetes mellitus without complication ([place])   . Hyperlipemia   . Hypertension   . Nonischemic cardiomyopathy [facility])    a. s/p [employer] ICD placement 2008, generator change 11/2013  b. Echo 2015: EF 30-35%     PAST SURGICAL HISTORY:   Past Surgical History:  Procedure Laterality Date  . CARDIAC DEFIBRILLATOR PLACEMENT    . PERIPHERAL VASCULAR CATHETERIZATION N/A 11/11/2015    Procedure: Visceral Artery Intervention;  Surgeon: Algernon Huxley, MD;  Location: [place] [facility];  Service: Cardiovascular;  Laterality: N/A;     SOCIAL HISTORY:   Social History   Tobacco Use  . Smoking status: Former Research scientist (life sciences)  . Smokeless tobacco: Never Used  Substance Use Topics  . Alcohol use: No     FAMILY HISTORY:   Family History  Problem Relation Age of Onset  . Diabetes Father      DRUG ALLERGIES:   Allergies  Allergen Reactions  . Penicillins Rash    Has patient had a PCN reaction causing immediate rash, facial/tongue/throat swelling, SOB or lightheadedness with hypotension: Yes Has patient had a PCN reaction causing severe rash involving mucus membranes or skin necrosis: No Has patient had a PCN reaction that required hospitalization No Has patient had a PCN reaction occurring within the last 10 years: No If all of the above answers are "NO", then may proceed with Cephalosporin use.    MEDICATIONS AT HOME:   Prior to Admission medications   Medication Sig Start Date End Date Taking? Authorizing Provider  acetaminophen (TYLENOL) 325 MG tablet Place 650 mg into feeding tube every 6 (six) hours as needed.   Yes [provider]  furosemide (LASIX) 20 MG tablet Take 2 tablets (40 mg total) by mouth daily. Patient taking differently: Take 80 mg by mouth 2 (two) times daily.  04/03/18  Yes Lisa Roca, MD  gabapentin (NEURONTIN) 600 MG  tablet Place 600 mg into feeding tube 3 (three) times daily.  08/22/15  Yes [provider]  Insulin Detemir (LEVEMIR FLEXTOUCH) 100 UNIT/ML Pen Inject 65 Units into the skin at bedtime.  06/01/17  Yes [provider]  losartan (COZAAR) 25 MG tablet Place 50 mg into feeding tube daily.  06/01/17  Yes [provider]  XARELTO 20 MG TABS tablet Take 20 mg by mouth daily with supper.  09/24/15  Yes [provider]    REVIEW OF SYSTEMS:  Review of Systems  Unable to perform ROS: Critical  illness     VITAL SIGNS:   Vitals:   06/11/2018 2215 05/29/2018 2230 05/28/2018 2245 06/16/2018 2300  BP: (!) 98/50 (!) 72/60 (!) 117/103 (!) 77/49  Pulse:    (!) 40  Resp: (!) 22 (!) 21 (!) 21 (!) 22  Temp: (!) 101 F (38.3 C) (!) 101 F (38.3 C) (!) 101.1 F (38.4 C) (!) 101.1 F (38.4 C)  TempSrc:      SpO2:    99%  Weight:      Height:       Wt Readings from Last 3 Encounters:  06/01/2018 90.7 kg  04/03/18 104.3 kg  11/19/15 104.8 kg    PHYSICAL EXAMINATION:  Physical Exam  Vitals reviewed. Constitutional: He appears well-developed and well-nourished.  HENT:  Head: Normocephalic and atraumatic.  Mouth/Throat: Oropharynx is clear and moist.  Eyes: Pupils are equal, round, and reactive to light. Conjunctivae and EOM are normal. No scleral icterus.  Neck: Normal range of motion. Neck supple. No JVD present. No thyromegaly present.  Cardiovascular: Regular rhythm and intact distal pulses. Exam reveals no gallop and no friction rub.  No murmur heard. Tachycardic  Respiratory: He is in respiratory distress (Intubated and mechanically ventilated). He has no wheezes. He has no rales.  GI: Soft. Bowel sounds are normal. He exhibits no distension. There is no abdominal tenderness.  Musculoskeletal: Normal range of motion.        General: No edema.     Comments: No arthritis, no gout  Lymphadenopathy:    He has no cervical adenopathy.  Neurological:  Unable to assess due to critical illness  Skin: Skin is warm and dry. No rash noted. No erythema.  Psychiatric:  Unable to assess due to critical illness    LABORATORY PANEL:   CBC Recent Labs  Lab 06/16/2018 1801  WBC 23.5*  HGB 12.2*  HCT 40.4  PLT 252   ------------------------------------------------------------------------------------------------------------------  Chemistries  Recent Labs  Lab 05/27/2018 1801  NA 159*  K 5.7*  CL 128*  CO2 23  GLUCOSE 482*  BUN 162*  CREATININE 3.47*  CALCIUM 8.0*  AST 194*   ALT 260*  ALKPHOS 156*  BILITOT 0.4   ------------------------------------------------------------------------------------------------------------------  Cardiac Enzymes No results for input(s): TROPONINI in the last 168 hours. ------------------------------------------------------------------------------------------------------------------  RADIOLOGY:  Ct Head Wo Contrast  Result Date: 05/22/2018 CLINICAL DATA:  Altered mental status. Unresponsive. EXAM: CT HEAD WITHOUT CONTRAST TECHNIQUE: Contiguous axial images were obtained from the base of the skull through the vertex without intravenous contrast. COMPARISON:  None. FINDINGS: Brain: Ill-defined edema centered within the LEFT frontotemporal lobe, with probable extension to the LEFT parietal lobe, highly suspicious for subacute infarction. Scattered hyperdense foci within the edema is most likely hemorrhagic conversion. Associated local mass effect with sulcal effacement. No midline shift or herniation. Additional small old infarcts within the LEFT frontal lobe and LEFT occipital lobe. Chronic small vessel ischemic changes noted within  the bilateral periventricular and subcortical white matter regions. Vascular: Chronic calcified atherosclerotic changes of the large vessels at the skull base. No unexpected hyperdense vessel. Skull: Normal. Negative for fracture or focal lesion. Sinuses/Orbits: No acute finding. Other: None. IMPRESSION: 1. Ill-defined edema centered within the LEFT frontotemporal lobe, highly suspicious for subacute infarction. Scattered hyperdense foci within this presumed edema is compatible with acute hemorrhagic conversion, likely a combination of acute parenchymal and subarachnoid hemorrhage. There is associated local mass effect with sulcal effacement. No midline shift or herniation. 2. Additional chronic/incidental findings detailed above. Critical Value/emergent results were called by telephone at the time of interpretation  on 06/13/2018 at 8:10 pm to Dr. [name] , who verbally acknowledged these results. Electronically Signed   By: Franki Cabot M.D.   On: 06/16/2018 20:11   Dg Chest Portable 1 View  Result Date: 06/18/2018 CLINICAL DATA:  Post intubation.  OG tube placement. EXAM: PORTABLE CHEST 1 VIEW COMPARISON:  06/04/2018 FINDINGS: Endotracheal tube has been placed and is 4 cm above the carina. OG tube tip is in the distal esophagus near the GE junction. Left AICD is unchanged. Mild cardiomegaly. Right basilar atelectasis or infiltrate. Left lung clear. No effusions or acute bony abnormality. IMPRESSION: Mild cardiomegaly.  Right base atelectasis or infiltrate. Endotracheal tube in expected position. OG tube tip is near the GE junction and could be advanced several cm for positioning into the stomach. Electronically Signed   By: Rolm Baptise M.D.   On: 05/22/2018 18:56   Dg Chest Port 1 View  Result Date: 06/06/2018 CLINICAL DATA:  Unresponsive, fever, atrial fibrillation, hypertension EXAM: PORTABLE CHEST 1 VIEW COMPARISON:  04/03/2018 FINDINGS: Stable cardiomegaly and vascular congestion. Chronic right hemidiaphragm elevation with basilar atelectasis. No definite focal pneumonia, collapse or consolidation. Negative for large effusion or pneumothorax. Trachea midline. Left subclavian defibrillator noted. IMPRESSION: Cardiomegaly with vascular congestion. Basilar atelectasis. No significant interval change. Electronically Signed   By: Jerilynn Mages.  Shick M.D.   On: 06/11/2018 18:41   Dg Abd Portable 1 View  Result Date: 06/10/2018 CLINICAL DATA:  Post intubation.  OG tube placement EXAM: PORTABLE ABDOMEN - 1 VIEW COMPARISON:  None. FINDINGS: OG tube tip is near the GE junction. IMPRESSION: OG tube tip near the GE junction. This could be advanced several cm for positioning into the stomach. Electronically Signed   By: Rolm Baptise M.D.   On: 05/27/2018 18:57    EKG:   Orders placed or performed during the hospital  encounter of 06/17/2018  . ED EKG 12-Lead  . ED EKG 12-Lead    IMPRESSION AND PLAN:  Principal Problem:   Severe sepsis (HCC) -IV antibiotics, patient was initially hypotensive the blood pressure did respond to IV fluid resuscitation in the ED, lactic acid elevated, IV fluids ordered and we will trend lactic acid until within normal limits.  Cultures sent.  Sepsis is due to UTI Active Problems:   Acute lower UTI -IV antibiotics and cultures as above   Acute on chronic kidney failure (HCC) -related to his sepsis, IV fluids as above, avoid nephrotoxins and monitor   Type II diabetes mellitus with complication (HCC) -sliding scale insulin coverage   Chronic systolic CHF (congestive heart failure) ([place]) -continue home meds per tube   HTN (hypertension) -patient's blood pressure improved, but is still borderline low, hold antihypertensives   Chronic atrial fibrillation -continue home meds except for any antihypertensives  Chart review performed and case discussed with ED provider. Labs, imaging and/or ECG reviewed by  provider and discussed with patient/family. Management plans discussed with the patient and/or family.  DVT PROPHYLAXIS: Mechanical only  GI PROPHYLAXIS:  H2 blocker  ADMISSION STATUS: Inpatient     CODE STATUS: Full, though family is discussing further goals of care Code Status History    Date Active Date Inactive Code Status Order ID Comments User Context   11/11/2015 1658 11/12/2015 1016 Full Code [id number]  Jules Husbands, MD ED      TOTAL CRITICAL CARE TIME TAKING CARE OF THIS PATIENT: 50 minutes.   Ethlyn Daniels 06/12/2018, 11:34 PM  [facility]  Office  [phone]  CC: Primary care physician; System, Pcp Not In  Note:  This document was prepared using Dragon voice recognition software and may include unintentional dictation errors.

## 2018-06-14 NOTE — ED Notes (Signed)
This EDT and Zach EDT cleaned up pt due to bowel movement. Upon cleaning the pt noted raw sores on patients scrotal area and back of legs, Environmental education officer notified

## 2018-06-14 NOTE — ED Notes (Signed)
10 of etomidate in by [employer]

## 2018-06-14 NOTE — ED Notes (Signed)
Family at bedside, MD McShane at bedside speaking with family

## 2018-06-14 NOTE — ED Notes (Signed)
Pt being bagged, MD attempting intubation

## 2018-06-14 NOTE — Consult Note (Signed)
Pharmacy Antibiotic Note  Alex Vance is a 73 y.o. male admitted on 06/04/2018 with sepsis (unknown source-though nursing noted raw sores on patients scrotal area and back of legs)  Pharmacy has been consulted for Vancomycin/Azactam dosing.  Plan: Patient will receive loading dose of 2g Vancomycin (1g followed by a second 1g dose - communicated with nursing to administer a total of 2g)  Vancomycin 1500 mg IV Q 48 hrs. Goal AUC 400-550. Expected AUC: 499 SCr used: 3.47  **Regarding Azactam - pt is unconscious and has a documented PCN allergy (family accompanying pt could no give details) - could not find evidence in patient record of Cephalosporin use - will dose Azactam for now and consult with hospitalist regarding possible Cephalosporin use.  For now: Aztreonam 1g q 8 hours (renally adjusted - first dose was a full 2g prior to Scr resulting)  Height: 6\' 1"  (185.4 cm) Weight: 200 lb (90.7 kg) IBW/kg (Calculated) : 79.9  Temp (24hrs), Avg:101.6 F (38.7 C), Min:100.8 F (38.2 C), Max:103.2 F (39.6 C)  Recent Labs  Lab 06/15/2018 1801  WBC 23.5*  CREATININE 3.47*  LATICACIDVEN 2.8*    Estimated Creatinine Clearance: 22.4 mL/min (A) (by C-G formula based on SCr of 3.47 mg/dL (H)).    Allergies  Allergen Reactions  . Penicillins Rash    Has patient had a PCN reaction causing immediate rash, facial/tongue/throat swelling, SOB or lightheadedness with hypotension: Yes Has patient had a PCN reaction causing severe rash involving mucus membranes or skin necrosis: No Has patient had a PCN reaction that required hospitalization No Has patient had a PCN reaction occurring within the last 10 years: No If all of the above answers are "NO", then may proceed with Cephalosporin use.    Antimicrobials this admission: 2/24 Aztreonam >> 2/24 Vancomycin >>   Dose adjustments this admission: None  Microbiology results: 2/24 BCx: pending 2/24 UCx: pending   Thank you for allowing  pharmacy to be a part of this patient's care.  Lu Duffel, PharmD, BCPS Clinical Pharmacist 06/13/2018 7:45 PM

## 2018-06-14 NOTE — ED Notes (Signed)
Report given Charleen Kirks RN

## 2018-06-14 NOTE — ED Notes (Signed)
X-ray called for STAT portable

## 2018-06-14 NOTE — ED Provider Notes (Addendum)
[facility] Emergency Department Provider Note  ____________________________________________   I have reviewed the triage vital signs and the nursing notes. Where available I have reviewed prior notes and, if possible and indicated, outside hospital notes.    HISTORY  Chief Complaint unresponsive    HPI Alex Vance is a 73 y.o. male resents today unresponsive, he was discharged recently from [facility], at that time, patient had had uncomplicated stay with a tube placed, he was aphasic, nonambulatory at discharge he was sent to a rehab facility, patient has not been walking since that time he went to the facility on Thursday.  Family states over the weekend he did nothing but sleep, finally, his significant other called him into the room, to witness that he was still unresponsive.  They were unable to wake him up sugar was elevated, EMS was called, they found him unresponsive they brought him in.  Patient is febrile.  He cannot give a history.  Patient does have a very complicated recent history of urinary tract infection, cultures are not revealed an initial survey of the chart.  Patient has family at bedside.  Daughter and companion.  They state that they want him intubated.  As needed.  Patient does have a history of chronic A. fib on Xarelto, diabetes mellitus is not known to be on any pain medication.  Level 5 chart caveat    Past Medical History:  Diagnosis Date  . Chronic atrial fibrillation    a. on Xarelto  . Diabetes mellitus without complication ([place])   . Hyperlipemia   . Hypertension   . Nonischemic cardiomyopathy [facility])    a. s/p [employer] ICD placement 2008, generator change 11/2013  b. Echo 2015: EF 30-35%    Patient Active Problem List   Diagnosis Date Noted  . Protein-calorie malnutrition, severe 11/20/2015  . CHF (congestive heart failure) ([place])   . Postoperative ileus ([place])   . 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 Dilated cardiomyopathy ([place]) 11/11/2015  . Type II diabetes mellitus with complication ([place]) 32/95/1884  . Chronic systolic CHF (congestive heart failure) ([place]) 11/11/2015  . HTN, goal below 140/80 11/11/2015  . Chronic atrial fibrillation 11/11/2015    Past Surgical History:  Procedure Laterality Date  . CARDIAC DEFIBRILLATOR PLACEMENT    . PERIPHERAL VASCULAR CATHETERIZATION N/A 11/11/2015   Procedure: Visceral Artery Intervention;  Surgeon: Algernon Huxley, MD;  Location: [place] CV LAB;  Service: Cardiovascular;  Laterality: N/A;    Prior to Admission medications   Medication Sig Start Date End Date Taking? Authorizing Provider  acetaminophen (TYLENOL) 325 MG tablet Place 650 mg into feeding tube every 6 (six) hours as needed.   Yes [provider]  furosemide (LASIX) 20 MG tablet Take 2 tablets (40 mg total) by mouth daily. Patient taking differently: Take 80 mg by mouth 2 (two) times daily.  04/03/18  Yes Lisa Roca, MD  gabapentin (NEURONTIN) 600 MG tablet Place 600 mg into feeding tube 3 (three) times daily.  08/22/15  Yes [provider]  Insulin Detemir (LEVEMIR FLEXTOUCH) 100 UNIT/ML Pen Inject 65 Units into the skin at bedtime.  06/01/17  Yes [provider]  losartan (COZAAR) 25 MG tablet Place 50 mg into feeding tube daily.  06/01/17  Yes [provider]  XARELTO 20 MG TABS tablet Take 20 mg by mouth daily with supper.  09/24/15  Yes [provider]    Allergies  Penicillins  Family History  Problem Relation Age of Onset  . Diabetes Father     Social History Social History   Tobacco Use  . Smoking status: Former Research scientist (life sciences)  . Smokeless tobacco: Never Used  Substance Use Topics  . Alcohol use: No  . Drug use: No    Review of Systems Constitutional: No fever/chills Eyes: No visual changes. ENT: No sore throat. No stiff neck no neck pain Cardiovascular: Denies chest pain. Respiratory:  Denies shortness of breath. Gastrointestinal:   no vomiting.  No diarrhea.  No constipation. Genitourinary: Negative for dysuria. Musculoskeletal: Negative lower extremity swelling Skin: Negative for rash. Neurological: Negative for severe headaches, focal weakness or numbness.   ____________________________________________   PHYSICAL EXAM:  VITAL SIGNS: ED Triage Vitals  Enc Vitals Group     BP 05/29/2018 1741 108/70     Pulse Rate 06/13/2018 1741 (!) 108     Resp 05/24/2018 1741 (!) 31     Temp 05/28/2018 1741 (!) 103.2 F (39.6 C)     Temp Source 06/09/2018 1741 Rectal     SpO2 06/06/2018 1741 (!) 83 %     Weight 06/06/2018 1736 200 lb (90.7 kg)     Height 05/23/2018 1736 6\' 1"  (1.854 m)     Head Circumference --      Peak Flow --      Pain Score --      Pain Loc --      Pain Edu? --      Excl. in [place]? --     Constitutional: Nonresponsive to painful stimuli, groans a little bit Eyes: Conjunctivae are normal Head: Atraumatic HEENT: No congestion/rhinnorhea. Mucous membranes are dry with caked on what appears to be food present in tongue..  Oropharynx non-erythematous Neck:   No meningismus noted Cardiovascular: Normal rate, regular rhythm. Grossly normal heart sounds.  Good peripheral circulation. Respiratory: Normal respiratory effort.  Patient with increased work of breathing, has some degree of coarse breath sounds Abdominal: Soft and discernibly tender.   GU: There is a scrotal mass noted, does not appear to be at or hot to touch, no crepitus.  Of note, scrotal mass was ultrasounded and CT scan at [facility]. Musculoskeletal: No obvious deformities noted Neurologic: Unresponsive cannot perform neurologic exam. Skin:  Skin is warm, dry and intact. No rash noted.  ____________________________________________   LABS (all labs ordered are listed, but only abnormal results are displayed)  Labs Reviewed  GLUCOSE, CAPILLARY - Abnormal; Notable for the following components:      Result  Value   Glucose-Capillary 379 (*)    All other components within normal limits  CULTURE, BLOOD (ROUTINE X 2)  CULTURE, BLOOD (ROUTINE X 2)  URINE CULTURE  LACTIC ACID, PLASMA  LACTIC ACID, PLASMA  COMPREHENSIVE METABOLIC PANEL  CBC WITH DIFFERENTIAL/PLATELET  URINALYSIS, ROUTINE W REFLEX MICROSCOPIC  INFLUENZA PANEL BY PCR (TYPE A & B)    Pertinent labs  results that were available during my care of the patient were reviewed by me and considered in my medical decision making (see chart for details). ____________________________________________  EKG  I personally interpreted any EKGs ordered by me or triage Atrial fibrillation rate 113 no acute ST elevation or depression, LAD noted no acute ischemia ____________________________________________  RADIOLOGY  Pertinent labs & imaging results that were available during my care of the patient were reviewed by me and considered in my medical decision making (see chart for details). If possible, patient and/or family made aware of any abnormal  findings.  No results found. ____________________________________________    PROCEDURES  Procedure(s) performed: None  Procedure Name: Intubation Date/Time: 05/30/2018 6:35 PM Performed by: Schuyler Amor, MD Pre-anesthesia Checklist: Patient identified, Patient being monitored and Timeout performed Oxygen Delivery Method: Ambu bag Preoxygenation: Pre-oxygenation with 100% oxygen Induction Type: IV induction and Rapid sequence Ventilation: Mask ventilation without difficulty Laryngoscope Size: Glidescope and 4 Grade View: Grade I Tube size: 7.5 mm Number of attempts: 1       Critical Care performed: CRITICAL CARE Performed by: Schuyler Amor   Total critical care time: 55 minutes  Critical care time was exclusive of separately billable procedures and treating other patients.  Critical care was necessary to treat or prevent imminent or life-threatening  deterioration.  Critical care was time spent personally by me on the following activities: development of treatment plan with patient and/or surrogate as well as nursing, discussions with consultants, evaluation of patient's response to treatment, examination of patient, obtaining history from patient or surrogate, ordering and performing treatments and interventions, ordering and review of laboratory studies, ordering and review of radiographic studies, pulse oximetry and re-evaluation of patient's condition.   ____________________________________________   INITIAL IMPRESSION / ASSESSMENT AND PLAN / ED COURSE  Pertinent labs & imaging results that were available during my care of the patient were reviewed by me and considered in my medical decision making (see chart for details).  Here with high fever, has nonresponsive status, he is not guarding his airway, I have talked to his primary caregivers and they do want intubation which I therefore did after Narcan did nothing.  The patient has a likely infection, no blood work is back yet, we will obtain CT scan given his altered level, does have a history of urinary tract infections initial in and out catheterization did not reveal urine we will place a Foley, we are giving him sepsis level medications now that he is intubated also giving him fluids.  We are initiating sepsis work-up, patient does have a penicillin allergy we are avoiding penicillin.   ----------------------------------------- 7:27 PM on 06/17/2018 -----------------------------------------  Patient vital signs are stable at this time, we are advancing the G-tube, ET tube is in good placement per chest x-ray and radiology, we are obtaining CT scan of the head, he is now making urine, does have a history of recent urinary tract infections, we are giving broad-spectrum antibiotics and he will be admitted  ----------------------------------------- 8:28 PM on  06/09/2018 -----------------------------------------  The scan shows possible small evidence of hemorrhagic conversion of a stroke.  He is nonresponsive at this time.  He is hypernatremic, he has a clear urinary tract infection with purulent looking urine coming out, he is intubated not doing anything at this time without significant sedation, I talked to Dr. Lacinda Axon of neurosurgery, he states that patient should either be made DNR which he advises if possible or be shipped to a referral hospital.  He states that DNR would be the best bet for this patient.  He states however that there is very little likelihood of any kind of prevention this likely going to make his life better and that he is also evidently uroseptic, in addition, the patient has multiple different other comorbidities at this time and his quality of life is deteriorated to the point where he is aphasic and doing tube feeds.  However, the family has left of the hospital for at least the room we are trying to find him for further input.  ----------------------------------------- 8:50  PM on 06/10/2018 ----------------------------------------- Lenell Antu, his sister, is here, she does not want him transferred to [facility] and does not think that even if he were a candidate for neurosurgical intervention which at this time I do not think he likely has, they would want it done.  Patient's quality of life is terrible.  They at this time are tending towards having him be DNR however, she wants all of his family member together around and she is calling them from home.  I did suggest them this would be a time sensitive event, and they state that they do not want him to be transferred and they want to keep him here and they understand the risks involved in that.  They are essentially want some comfort measures only in terms of intervention except for they want him "alive and the family gets here if possible" and she wants [place] to do everything cleaning CPR until the  family arrives.   given that, we will continue treatment.  As there is some apparent discord about who exactly is making decision with the family and she wants to talk to everyone we will go ahead and reverse his Xarelto while we are waiting for ultimate family input.   However, at this point I do not see any point in trying to send the patient across the state for further intervention that is not going to improve his likelihood of survival or his outcome.  I did discuss with Dr. Jannifer Franklin of the hospitalist service, he agrees, and I signed out to dr. Kerman Passey at the end of my shift.   ____________________________________________   FINAL CLINICAL IMPRESSION(S) / ED DIAGNOSES  Final diagnoses:  Fever      This chart was dictated using voice recognition software.  Despite best efforts to proofread,  errors can occur which can change meaning.      Schuyler Amor, MD 05/25/2018 Kathyrn Drown    Schuyler Amor, MD 05/30/2018 2030    Schuyler Amor, MD 06/13/2018 2036    Schuyler Amor, MD 05/22/2018 2054    Schuyler Amor, MD 05/24/2018 2111

## 2018-06-14 NOTE — ED Notes (Signed)
MD at bedside to intubate pt at this time. Respiratory at bedside. EDT Shirlean Mylar. RN Annie Main

## 2018-06-15 ENCOUNTER — Inpatient Hospital Stay: Payer: Medicare Other

## 2018-06-15 ENCOUNTER — Other Ambulatory Visit: Payer: Medicare Other

## 2018-06-15 DIAGNOSIS — R6520 Severe sepsis without septic shock: Secondary | ICD-10-CM

## 2018-06-15 DIAGNOSIS — A419 Sepsis, unspecified organism: Principal | ICD-10-CM

## 2018-06-15 DIAGNOSIS — I639 Cerebral infarction, unspecified: Secondary | ICD-10-CM

## 2018-06-15 LAB — BASIC METABOLIC PANEL
Anion gap: 8 (ref 5–15)
BUN: 162 mg/dL — AB (ref 8–23)
CO2: 21 mmol/L — ABNORMAL LOW (ref 22–32)
Calcium: 7.4 mg/dL — ABNORMAL LOW (ref 8.9–10.3)
Chloride: 130 mmol/L — ABNORMAL HIGH (ref 98–111)
Creatinine, Ser: 3.26 mg/dL — ABNORMAL HIGH (ref 0.61–1.24)
GFR calc Af Amer: 21 mL/min — ABNORMAL LOW (ref >60)
GFR calc non Af Amer: 18 mL/min — ABNORMAL LOW (ref >60)
GLUCOSE: 412 mg/dL — AB (ref 70–99)
Potassium: 4.9 mmol/L (ref 3.5–5.1)
Sodium: 159 mmol/L — ABNORMAL HIGH (ref 135–145)

## 2018-06-15 LAB — GLUCOSE, CAPILLARY
Glucose-Capillary: 353 mg/dL — ABNORMAL HIGH (ref 70–99)
Glucose-Capillary: 379 mg/dL — ABNORMAL HIGH (ref 70–99)
Glucose-Capillary: 313 mg/dL — ABNORMAL HIGH (ref 70–99)
Glucose-Capillary: 185 mg/dL — ABNORMAL HIGH (ref 70–99)
Glucose-Capillary: 116 mg/dL — ABNORMAL HIGH (ref 70–99)
Glucose-Capillary: 158 mg/dL — ABNORMAL HIGH (ref 70–99)

## 2018-06-15 LAB — CBC
HCT: 37 % — ABNORMAL LOW (ref 39.0–52.0)
Hemoglobin: 10.9 g/dL — ABNORMAL LOW (ref 13.0–17.0)
MCH: 24.8 pg — AB (ref 26.0–34.0)
MCHC: 29.5 g/dL — AB (ref 30.0–36.0)
MCV: 84.3 fL (ref 80.0–100.0)
Platelets: 192 10*3/uL (ref 150–400)
RBC: 4.39 MIL/uL (ref 4.22–5.81)
RDW: 20.1 % — ABNORMAL HIGH (ref 11.5–15.5)
WBC: 26.3 10*3/uL — ABNORMAL HIGH (ref 4.0–10.5)
nRBC: 0 % (ref 0.0–0.2)

## 2018-06-15 LAB — MAGNESIUM: Magnesium: 3.3 mg/dL — ABNORMAL HIGH (ref 1.7–2.4)

## 2018-06-15 LAB — MRSA PCR SCREENING: MRSA by PCR: NEGATIVE

## 2018-06-15 LAB — PROCALCITONIN: PROCALCITONIN: 2.62 ng/mL

## 2018-06-15 MED ORDER — FAMOTIDINE IN NACL 20-0.9 MG/50ML-% IV SOLN
20.0000 mg | Freq: Every day | INTRAVENOUS | Status: DC
  Administered 2018-06-15 – 2018-06-19 (×5): 20 mg via INTRAVENOUS
  Filled 2018-06-15 (×6): qty 50

## 2018-06-15 MED ORDER — ONDANSETRON HCL 4 MG/2ML IJ SOLN: 4.0000 mg | Freq: Four times a day (QID) | INTRAMUSCULAR | Status: DC | PRN

## 2018-06-15 MED ORDER — CHLORHEXIDINE GLUCONATE 0.12% ORAL RINSE (MEDLINE KIT)
15.0000 mL | Freq: Two times a day (BID) | OROMUCOSAL | Status: DC
  Administered 2018-06-15 – 2018-06-19 (×10): 15 mL via OROMUCOSAL

## 2018-06-15 MED ORDER — VANCOMYCIN HCL 10 G IV SOLR
2000.0000 mg | Freq: Once | INTRAVENOUS | Status: AC
  Administered 2018-06-15: 2000 mg via INTRAVENOUS
  Filled 2018-06-15: qty 2000

## 2018-06-15 MED ORDER — SODIUM CHLORIDE 0.9 % IV SOLN
1.0000 g | Freq: Two times a day (BID) | INTRAVENOUS | Status: DC
  Administered 2018-06-15 – 2018-06-16 (×2): 1 g via INTRAVENOUS
  Filled 2018-06-15 (×4): qty 1

## 2018-06-15 MED ORDER — INSULIN ASPART 100 UNIT/ML ~~LOC~~ SOLN
0.0000 [IU] | SUBCUTANEOUS | Status: DC
  Administered 2018-06-15: 3 [IU] via SUBCUTANEOUS
  Administered 2018-06-15: 15 [IU] via SUBCUTANEOUS
  Administered 2018-06-15: 11 [IU] via SUBCUTANEOUS
  Administered 2018-06-15: 15 [IU] via SUBCUTANEOUS
  Administered 2018-06-15: 3 [IU] via SUBCUTANEOUS
  Administered 2018-06-16 (×2): 5 [IU] via SUBCUTANEOUS
  Administered 2018-06-16 (×2): 8 [IU] via SUBCUTANEOUS
  Administered 2018-06-16: 3 [IU] via SUBCUTANEOUS
  Filled 2018-06-15 (×10): qty 1

## 2018-06-15 MED ORDER — ACETAMINOPHEN 325 MG PO TABS
650.0000 mg | ORAL_TABLET | ORAL | Status: DC | PRN
  Administered 2018-06-15: 650 mg via ORAL
  Filled 2018-06-15 (×2): qty 2

## 2018-06-15 MED ORDER — ORAL CARE MOUTH RINSE
15.0000 mL | OROMUCOSAL | Status: DC
  Administered 2018-06-15 – 2018-06-19 (×44): 15 mL via OROMUCOSAL

## 2018-06-15 NOTE — Progress Notes (Signed)
Name: Alex Vance MRN: [id number] DOB: 08/21/1945     CONSULTATION DATE: 06/15/2018  REFERRING MD :  Jannifer Franklin  CHIEF COMPLAINT:  resp failure  STUDIES:  CT head -Ill-defined edema centered within the LEFT frontotemporal lobe, highly suspicious for subacute infarction. Scattered hyperdense foci within this presumed edema is compatible with acute hemorrhagic conversion, likely a combination of acute parenchymal and subarachnoid hemorrhage. There is associated local mass effect with sulcal effacement. No midline shift or herniation.   HISTORY OF PRESENT ILLNESS:   73 y.o.malebrought to the ED due to unresponsiveness. He was recently discharged from [facility] after having significant stroke and a long stay pending with the patient being completely aphasic, and nonambulatory. He had PEG tube placed. Today he was brought to the ED because he was unresponsive and family found that his glucose was elevated. Here in the ED evaluation shows a new stroke with early hemorrhagic transformation. Patient has chronic A. fib and was on Xarelto. Reversal agents were given in the ED. Given his mental status he required intubation. Now intubated and ventilated. PCCM asked to assume care in ICU. VSS  Patient critically ill  Prognosis is GRAVE PAST MEDICAL HISTORY :   has a past medical history of Chronic atrial fibrillation, Diabetes mellitus without complication ([place]), Hyperlipemia, Hypertension, and Nonischemic cardiomyopathy ([place]).  has a past surgical history that includes Cardiac defibrillator placement and Cardiac catheterization (N/A, 11/11/2015). Prior to Admission medications   Medication Sig Start Date End Date Taking? Authorizing Provider  acetaminophen (TYLENOL) 325 MG tablet Place 650 mg into feeding tube every 6 (six) hours as needed.   Yes [provider]  furosemide (LASIX) 20 MG tablet Take 2 tablets (40 mg total) by mouth daily. Patient taking differently: Take 80 mg by  mouth 2 (two) times daily.  04/03/18  Yes Lisa Roca, MD  gabapentin (NEURONTIN) 600 MG tablet Place 600 mg into feeding tube 3 (three) times daily.  08/22/15  Yes [provider]  Insulin Detemir (LEVEMIR FLEXTOUCH) 100 UNIT/ML Pen Inject 65 Units into the skin at bedtime.  06/01/17  Yes [provider]  losartan (COZAAR) 25 MG tablet Place 50 mg into feeding tube daily.  06/01/17  Yes [provider]  XARELTO 20 MG TABS tablet Take 20 mg by mouth daily with supper.  09/24/15  Yes [provider]   Allergies  Allergen Reactions  . Penicillins Rash    Has patient had a PCN reaction causing immediate rash, facial/tongue/throat swelling, SOB or lightheadedness with hypotension: Yes Has patient had a PCN reaction causing severe rash involving mucus membranes or skin necrosis: No Has patient had a PCN reaction that required hospitalization No Has patient had a PCN reaction occurring within the last 10 years: No If all of the above answers are "NO", then may proceed with Cephalosporin use.    FAMILY HISTORY:  family history includes Diabetes in his father. SOCIAL HISTORY:  reports that he has quit smoking. He has never used smokeless tobacco. He reports that he does not drink alcohol or use drugs.  REVIEW OF SYSTEMS:   Unable to obtain due to critical illness   VITAL SIGNS: Temp:  [100.3 F (37.9 C)-103.2 F (39.6 C)] 100.8 F (38.2 C) (02/25 0115) Pulse Rate:  [25-111] 91 (02/25 0000) Resp:  [17-40] 18 (02/25 0115) BP: (72-122)/(38-103) 109/64 (02/25 0115) SpO2:  [83 %-100 %] 96 % (02/25 0050) FiO2 (%):  [40 %] 40 % (02/25 0050) Weight:  [90.2 kg-90.7 kg] 90.2 kg (  02/25 0100)  Physical Examination:  GENERAL:critically ill appearing, +resp distress HEAD: Normocephalic, atraumatic.  EYES: Pupils equal, round, reactive to light.  No scleral icterus.  MOUTH: Moist mucosal membrane. NECK: Supple. No JVD.  PULMONARY: +rhonchi,  +wheezing CARDIOVASCULAR: S1 and S2. Regular rate and rhythm. No murmurs, rubs, or gallops.  GASTROINTESTINAL: Soft, nontender, -distended. No masses. Positive bowel sounds. No hepatosplenomegaly.  MUSCULOSKELETAL: No swelling, clubbing, or edema.  NEUROLOGIC: obtunded SKIN:intact,warm,dry    CULTURE RESULTS   No results found for this or any previous visit (from the past 240 hour(s)).        IMAGING    Ct Head Wo Contrast  Result Date: 05/27/2018 CLINICAL DATA:  Altered mental status. Unresponsive. EXAM: CT HEAD WITHOUT CONTRAST TECHNIQUE: Contiguous axial images were obtained from the base of the skull through the vertex without intravenous contrast. COMPARISON:  None. FINDINGS: Brain: Ill-defined edema centered within the LEFT frontotemporal lobe, with probable extension to the LEFT parietal lobe, highly suspicious for subacute infarction. Scattered hyperdense foci within the edema is most likely hemorrhagic conversion. Associated local mass effect with sulcal effacement. No midline shift or herniation. Additional small old infarcts within the LEFT frontal lobe and LEFT occipital lobe. Chronic small vessel ischemic changes noted within the bilateral periventricular and subcortical white matter regions. Vascular: Chronic calcified atherosclerotic changes of the large vessels at the skull base. No unexpected hyperdense vessel. Skull: Normal. Negative for fracture or focal lesion. Sinuses/Orbits: No acute finding. Other: None. IMPRESSION: 1. Ill-defined edema centered within the LEFT frontotemporal lobe, highly suspicious for subacute infarction. Scattered hyperdense foci within this presumed edema is compatible with acute hemorrhagic conversion, likely a combination of acute parenchymal and subarachnoid hemorrhage. There is associated local mass effect with sulcal effacement. No midline shift or herniation. 2. Additional chronic/incidental findings detailed above. Critical Value/emergent  results were called by telephone at the time of interpretation on 06/15/2018 at 8:10 pm to Dr. [name] , who verbally acknowledged these results. Electronically Signed   By: Franki Cabot M.D.   On: 06/10/2018 20:11   Dg Chest Portable 1 View  Result Date: 05/26/2018 CLINICAL DATA:  Post intubation.  OG tube placement. EXAM: PORTABLE CHEST 1 VIEW COMPARISON:  05/26/2018 FINDINGS: Endotracheal tube has been placed and is 4 cm above the carina. OG tube tip is in the distal esophagus near the GE junction. Left AICD is unchanged. Mild cardiomegaly. Right basilar atelectasis or infiltrate. Left lung clear. No effusions or acute bony abnormality. IMPRESSION: Mild cardiomegaly.  Right base atelectasis or infiltrate. Endotracheal tube in expected position. OG tube tip is near the GE junction and could be advanced several cm for positioning into the stomach. Electronically Signed   By: Rolm Baptise M.D.   On: 05/24/2018 18:56   Dg Chest Port 1 View  Result Date: 06/13/2018 CLINICAL DATA:  Unresponsive, fever, atrial fibrillation, hypertension EXAM: PORTABLE CHEST 1 VIEW COMPARISON:  04/03/2018 FINDINGS: Stable cardiomegaly and vascular congestion. Chronic right hemidiaphragm elevation with basilar atelectasis. No definite focal pneumonia, collapse or consolidation. Negative for large effusion or pneumothorax. Trachea midline. Left subclavian defibrillator noted. IMPRESSION: Cardiomegaly with vascular congestion. Basilar atelectasis. No significant interval change. Electronically Signed   By: Jerilynn Mages.  Shick M.D.   On: 05/23/2018 18:41   Dg Abd Portable 1 View  Result Date: 06/01/2018 CLINICAL DATA:  Post intubation.  OG tube placement EXAM: PORTABLE ABDOMEN - 1 VIEW COMPARISON:  None. FINDINGS: OG tube tip is near the GE junction. IMPRESSION: OG tube tip near  the GE junction. This could be advanced several cm for positioning into the stomach. Electronically Signed   By: Rolm Baptise M.D.   On: 06/10/2018 18:57         Indwelling Urinary Catheter continued, requirement due to   Reason to continue Indwelling Urinary Catheter for strict Intake/Output monitoring for hemodynamic instability         Ventilator continued, requirement due to, resp failure         ASSESSMENT AND PLAN SYNOPSIS  ACUTE RESP FAILURE FROM ACUTE NEW CVA WITH SEVERE ENCEPHALOPATHY  Severe Hypoxic and Hypercapnic Respiratory Failure ACUTE CVA UNABLE TO PROTECT AIRWAY   Renal Failure-most likely due to ATN -follow chem 7 -follow UO -continue Foley Catheter-assess need daily   NEUROLOGY-ACUTE CVA - intubated and sedated - minimal sedation to achieve a RASS goal: -1    CARDIAC ICU monitoring    GI/Nutrition GI PROPHYLAXIS as indicated Constipation protocol as indicated  ENDO - ICU hypoglycemic\Hyperglycemia protocol -check FSBS per protocol   ELECTROLYTES -follow labs as needed -replace as needed -pharmacy consultation and following   DVT/GI PRX ordered TRANSFUSIONS AS NEEDED MONITOR FSBS ASSESS the need for LABS as needed   Critical Care Time devoted to patient care services described in this note is 31 minutes.   Overall, patient is critically ill, prognosis is guarded.  Patient with high risk for cardiac arrest and death.    VERY POOR PROGNOSIS PER REPORT PATIENT'S FAMILY IS COMING FROM OUT OF TOWN TO MAKE PATIENT COMFORT CARE  Cereniti Curb Patricia Pesa, M.D.  [facility]  Medical Director [place] Director [facility] Cardio-Pulmonary Department

## 2018-06-15 NOTE — Progress Notes (Signed)
Discussed plan of care with Dr. Mortimer Fries and made MD aware of CBG of 353 on admission.  MD gave order for q4H CBG with SSI and moderate coverage.

## 2018-06-15 NOTE — Procedures (Signed)
ELECTROENCEPHALOGRAM REPORT   Patient: Alex Vance       Room #: IC19A-AA EEG No. ID: [id number] Age: 73 y.o.        Sex: male Referring Physician: Stark Jock Report Date:  06/15/2018        Interpreting Physician: Alexis Goodell  History: Alex Vance is an 73 y.o. male with altered mental status  Medications:  Pepcid, Insulin, Levophed  Conditions of Recording:  This is a 21 channel routine scalp EEG performed with bipolar and monopolar montages arranged in accordance to the international 10/20 system of electrode placement. One channel was dedicated to EKG recording.  The patient is in the intubated and unresponsive state.  Description:  The background activity is slow and poorly organized.  It consists of a low voltage, polymorphic delta activity that is continuous and diffusely distributed.  There is some rare theta activity noted as well.  The patient is stimulated during the recording.  No activation of the background rhythm is noted.    No epileptiform activity is noted.   Hyperventilation and intermittent photic stimulation were not performed.   IMPRESSION: This is an abnormal EEG secondary to general background slowing.  This finding may be seen with a diffuse disturbance that is etiologically nonspecific, but may include a metabolic encephalopathy, among other possibilities.  No epileptiform activity was noted.     Alexis Goodell, MD Neurology [phone] 06/15/2018, 6:37 PM

## 2018-06-15 NOTE — Progress Notes (Signed)
During rounds discusses sodium level of 159, mag 3.3, bun 162 and creatinine of 3.26. WBC 26.3, patient running temperature. Per Dr. Mortimer Fries no new orders at this time. Consulted neurology. Will hold at this time for palliative consult. Continue to monitor.

## 2018-06-15 NOTE — Consult Note (Signed)
Referring Physician: Otila Back MD    Chief Complaint: Altered mental status  HPI: Canio Winokur is an 73 y.o. 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  Per ED reports EMS was initially called to the facility due to abnormal labs and when EMS arrived found patient unresponsive.  Patient's records indicate that he was recently admitted to [facility] on 05/21/2018 for left MCA territory stroke.  CTA showed distal M2 stenosis however it was too distal for thrombectomy.  Further work-up with TTE revealed EF greater than 30% with evidence of vegetation/thrombus, Xarelto initially held status post stroke but was restarted 2/8 with discontinuation of aspirin at that time.  Patient underwent PEG placement 2/7 and was discharged to [facility] for rehab.  Per family report patient has not been walking since he came to rehab on the day he was brought to the ED family had called check on him and found him unresponsive, initial blood sugar was checked and was noted to be elevated therefore EMS was called. On arrival to the ED, he was febrile  (temp 103.2) with blood pressure 108/70 mm Hg and pulse rate 108 beats/min.  Patient was nonresponsive and unable to protect is with airway therefore decision was made to intubate him in the ED for airway protection. Chest x-ray mild cardiomegaly.  Right base atelectasis or infiltrate. A non-contrast head CT was obtained and showed left frontotemporal lobe infarct with early hemorrhagic transformation.  Neurosurgery was consulted in the ED recommended patient be transferred however family opted not to transfer.  Labs revealed  sodium 159, potassium 5.7, glucose 482, BUN 162, creatinine 3.47, AST 194 and ALT 260, alkaline phosphate 156, GFR 20, WBC 23.5, lactic acid 2.8,  urinalysis positive for UTI; an insidious infectious workup was initiated, including rapid HIV, MRSA that were ultimately negative.  Patient was transferred to the ICU for close monitoring and management.  Date last known well: Unable to determine Time last known well: Unable to determine tPA Given: No: Unable to determine last known well.  Patient on NOAs.  Past Medical History:  Diagnosis Date  . Chronic atrial fibrillation    a. on Xarelto  . Diabetes mellitus without complication ([place])   . Hyperlipemia   . Hypertension   . Nonischemic cardiomyopathy [facility])    a. s/p [employer] ICD placement 2008, generator change 11/2013  b. Echo 2015: EF 30-35%    Past Surgical History:  Procedure Laterality Date  . CARDIAC DEFIBRILLATOR PLACEMENT    . PERIPHERAL VASCULAR CATHETERIZATION N/A 11/11/2015   Procedure: Visceral Artery Intervention;  Surgeon: Algernon Huxley, MD;  Location: [facility];  Service: Cardiovascular;  Laterality: N/A;    Family History  Problem Relation Age of Onset  . Diabetes Father    Social History:  reports that he has quit smoking. He has never used smokeless tobacco. He reports that he does not drink alcohol or use drugs.  Allergies:  Allergies  Allergen Reactions  . Penicillins Rash    Has patient had a PCN reaction causing immediate rash, facial/tongue/throat swelling, SOB or lightheadedness with hypotension: Yes Has patient had a PCN reaction causing severe rash involving mucus membranes or skin necrosis: No Has patient had a PCN reaction that required hospitalization No Has patient had a PCN reaction occurring within the last 10 years: No If all  of the above answers are "NO", then may proceed with Cephalosporin use.    Medications:  I have reviewed the patient's current medications. Prior to Admission:  Medications Prior to Admission  Medication Sig Dispense Refill Last Dose  . acetaminophen (TYLENOL) 325 MG tablet Place 650 mg into  feeding tube every 6 (six) hours as needed.   prn at prn  . furosemide (LASIX) 20 MG tablet Take 2 tablets (40 mg total) by mouth daily. (Patient taking differently: Take 80 mg by mouth 2 (two) times daily. ) 4 tablet 0 unknown at unknown  . gabapentin (NEURONTIN) 600 MG tablet Place 600 mg into feeding tube 3 (three) times daily.    05/30/2018 at 0800  . Insulin Detemir (LEVEMIR FLEXTOUCH) 100 UNIT/ML Pen Inject 65 Units into the skin at bedtime.    06/13/2018 at 2000  . losartan (COZAAR) 25 MG tablet Place 50 mg into feeding tube daily.    unknown at unknown  . XARELTO 20 MG TABS tablet Take 20 mg by mouth daily with supper.    06/04/2018 at 0800   Scheduled: . chlorhexidine gluconate (MEDLINE KIT)  15 mL Mouth Rinse BID  . insulin aspart  0-15 Units Subcutaneous Q4H  . mouth rinse  15 mL Mouth Rinse 10 times per day    ROS: History obtained from the patient   General ROS: negative for - chills, fatigue, fever, night sweats, weight gain or weight loss Psychological ROS: negative for - behavioral disorder, hallucinations, memory difficulties, mood swings or suicidal ideation Ophthalmic ROS: negative for - blurry vision, double vision, eye pain or loss of vision ENT ROS: negative for - epistaxis, nasal discharge, oral lesions, sore throat, tinnitus or vertigo Allergy and Immunology ROS: negative for - hives or itchy/watery eyes Hematological and Lymphatic ROS: negative for - bleeding problems, bruising or swollen lymph nodes Endocrine ROS: negative for - galactorrhea, hair pattern changes, polydipsia/polyuria or temperature intolerance Respiratory ROS: negative for - cough, hemoptysis, shortness of breath or wheezing Cardiovascular ROS: negative for - chest pain, dyspnea on exertion, edema or irregular heartbeat Gastrointestinal ROS: negative for - abdominal pain, diarrhea, hematemesis, nausea/vomiting or stool incontinence Genito-Urinary ROS: negative for - dysuria, hematuria, incontinence or  urinary frequency/urgency Musculoskeletal ROS: negative for - joint swelling or muscular weakness Neurological ROS: as noted in HPI Dermatological ROS: negative for rash and skin lesion changes  Physical Examination: Blood pressure (!) 99/44, pulse 94, temperature (!) 101.5 F (38.6 C), resp. rate (!) 26, height '6\' 1"'$  (1.854 m), weight 90.2 kg, SpO2 100 %.   HEENT-  Normocephalic, no lesions, without obvious abnormality.  Normal external eye and conjunctiva.  Normal TM's bilaterally.  Normal auditory canals and external ears. Normal external nose, mucus membranes and septum.  Normal pharynx. Cardiovascular- S1, S2 normal, pulses palpable throughout   Lungs- chest clear, no wheezing, rales, normal symmetric air entry Abdomen- soft, non-tender; bowel sounds normal; no masses,  no organomegaly Extremities- no edema Lymph-no adenopathy palpable Musculoskeletal-no joint tenderness, deformity or swelling Skin-warm and dry, no hyperpigmentation, vitiligo, or suspicious lesions  Mental Status: Patient does not respond to verbal stimuli.  Does not respond to deep sternal rub.  Does not follow commands.  No verbalizations noted.  Cranial Nerves: II: patient does not respond confrontation bilaterally, pupils right 3 mm, left 3 mm,and reactive bilaterally III,IV,VI: doll's response absent bilaterally.  V,VII: corneal reflex present bilaterally  VIII: patient does not respond to verbal stimuli IX,X: gag reflex present, XI: trapezius strength unable to  test bilaterally XII: tongue strength unable to test Motor: Extremities flaccid throughout.  No spontaneous movement noted.  No purposeful movements noted. Sensory: Does not respond to noxious stimuli in any extremity. Deep Tendon Reflexes:  1+ in the upper extremities and absent in the lower extremities. Plantars: absent bilaterally Cerebellar: Unable to perform   Data Reviewed  Laboratory Studies:  Basic Metabolic Panel: Recent Labs   Lab 05/27/2018 1801 06/15/18 0550  NA 159* 159*  K 5.7* 4.9  CL 128* 130*  CO2 23 21*  GLUCOSE 482* 412*  BUN 162* 162*  CREATININE 3.47* 3.26*  CALCIUM 8.0* 7.4*  MG  --  3.3*    Liver Function Tests: Recent Labs  Lab 06/16/2018 1801  AST 194*  ALT 260*  ALKPHOS 156*  BILITOT 0.4  PROT 6.2*  ALBUMIN 2.4*   No results for input(s): LIPASE, AMYLASE in the last 168 hours. No results for input(s): AMMONIA in the last 168 hours.  CBC: Recent Labs  Lab 05/22/2018 1801 06/15/18 0550  WBC 23.5* 26.3*  NEUTROABS 19.8*  --   HGB 12.2* 10.9*  HCT 40.4 37.0*  MCV 82.4 84.3  PLT 252 192    Cardiac Enzymes: No results for input(s): CKTOTAL, CKMB, CKMBINDEX, TROPONINI in the last 168 hours.  BNP: Invalid input(s): POCBNP  CBG: Recent Labs  Lab 06/11/2018 1754 06/15/18 0118 06/15/18 0353 06/15/18 0727 06/15/18 1132  GLUCAP 379* 353* 379* 313* 185*    Microbiology: Results for orders placed or performed during the hospital encounter of 06/04/2018  Blood Culture (routine x 2)     Status: None (Preliminary result)   Collection Time: 06/03/2018  6:01 PM  Result Value Ref Range Status   Specimen Description BLOOD LEFT ANTECUBITAL  Final   Special Requests   Final    BOTTLES DRAWN AEROBIC AND ANAEROBIC Blood Culture adequate volume   Culture   Final    NO GROWTH < 12 HOURS Performed at [facility], [address], [place], [place] [address]    Report Status PENDING  Incomplete  Blood Culture (routine x 2)     Status: None (Preliminary result)   Collection Time: 05/28/2018  6:01 PM  Result Value Ref Range Status   Specimen Description BLOOD BLOOD RIGHT WRIST  Final   Special Requests   Final    BOTTLES DRAWN AEROBIC AND ANAEROBIC Blood Culture adequate volume   Culture   Final    NO GROWTH < 12 HOURS Performed at [facility], [address], [place], [place] [address]    Report Status PENDING  Incomplete  MRSA PCR Screening     Status: None    Collection Time: 06/15/18 12:50 AM  Result Value Ref Range Status   MRSA by PCR NEGATIVE NEGATIVE Final    Comment:        The GeneXpert MRSA Assay (FDA approved for NASAL specimens only), is one component of a comprehensive MRSA colonization surveillance program. It is not intended to diagnose MRSA infection nor to guide or monitor treatment for MRSA infections. Performed at [facility], [address] [place], [place] [address]     Coagulation Studies: Recent Labs    06/04/2018 1900  LABPROT 16.4*  INR 1.3    Urinalysis:  Recent Labs  Lab 05/29/2018 1741  COLORURINE AMBER*  LABSPEC 1.021  PHURINE 5.0  GLUCOSEU NEGATIVE  HGBUR SMALL*  BILIRUBINUR SMALL*  KETONESUR 5*  PROTEINUR NEGATIVE  NITRITE NEGATIVE  LEUKOCYTESUR MODERATE*    Lipid  Panel: No results found for: CHOL, TRIG, HDL, CHOLHDL, VLDL, LDLCALC  HgbA1C:  Lab Results  Component Value Date   HGBA1C 9.1 (H) 11/11/2015    Urine Drug Screen:  No results found for: LABOPIA, COCAINSCRNUR, LABBENZ, AMPHETMU, THCU, LABBARB  Alcohol Level: No results for input(s): ETH in the last 168 hours.  Other results: EKG: there are no previous tracings available for comparison.  Imaging: Ct Head Wo Contrast  Result Date: 06/01/2018 CLINICAL DATA:  Altered mental status. Unresponsive. EXAM: CT HEAD WITHOUT CONTRAST TECHNIQUE: Contiguous axial images were obtained from the base of the skull through the vertex without intravenous contrast. COMPARISON:  None. FINDINGS: Brain: Ill-defined edema centered within the LEFT frontotemporal lobe, with probable extension to the LEFT parietal lobe, highly suspicious for subacute infarction. Scattered hyperdense foci within the edema is most likely hemorrhagic conversion. Associated local mass effect with sulcal effacement. No midline shift or herniation. Additional small old infarcts within the LEFT frontal lobe and LEFT occipital lobe. Chronic small vessel ischemic changes  noted within the bilateral periventricular and subcortical white matter regions. Vascular: Chronic calcified atherosclerotic changes of the large vessels at the skull base. No unexpected hyperdense vessel. Skull: Normal. Negative for fracture or focal lesion. Sinuses/Orbits: No acute finding. Other: None. IMPRESSION: 1. Ill-defined edema centered within the LEFT frontotemporal lobe, highly suspicious for subacute infarction. Scattered hyperdense foci within this presumed edema is compatible with acute hemorrhagic conversion, likely a combination of acute parenchymal and subarachnoid hemorrhage. There is associated local mass effect with sulcal effacement. No midline shift or herniation. 2. Additional chronic/incidental findings detailed above. Critical Value/emergent results were called by telephone at the time of interpretation on 05/26/2018 at 8:10 pm to Dr. [name] , who verbally acknowledged these results. Electronically Signed   By: Franki Cabot M.D.   On: 06/13/2018 20:11   Dg Chest Portable 1 View  Result Date: 05/30/2018 CLINICAL DATA:  Post intubation.  OG tube placement. EXAM: PORTABLE CHEST 1 VIEW COMPARISON:  06/06/2018 FINDINGS: Endotracheal tube has been placed and is 4 cm above the carina. OG tube tip is in the distal esophagus near the GE junction. Left AICD is unchanged. Mild cardiomegaly. Right basilar atelectasis or infiltrate. Left lung clear. No effusions or acute bony abnormality. IMPRESSION: Mild cardiomegaly.  Right base atelectasis or infiltrate. Endotracheal tube in expected position. OG tube tip is near the GE junction and could be advanced several cm for positioning into the stomach. Electronically Signed   By: Rolm Baptise M.D.   On: 06/13/2018 18:56   Dg Chest Port 1 View  Result Date: 05/24/2018 CLINICAL DATA:  Unresponsive, fever, atrial fibrillation, hypertension EXAM: PORTABLE CHEST 1 VIEW COMPARISON:  04/03/2018 FINDINGS: Stable cardiomegaly and vascular congestion.  Chronic right hemidiaphragm elevation with basilar atelectasis. No definite focal pneumonia, collapse or consolidation. Negative for large effusion or pneumothorax. Trachea midline. Left subclavian defibrillator noted. IMPRESSION: Cardiomegaly with vascular congestion. Basilar atelectasis. No significant interval change. Electronically Signed   By: Jerilynn Mages.  Shick M.D.   On: 06/13/2018 18:41   Dg Abd Portable 1 View  Result Date: 06/07/2018 CLINICAL DATA:  Post intubation.  OG tube placement EXAM: PORTABLE ABDOMEN - 1 VIEW COMPARISON:  None. FINDINGS: OG tube tip is near the GE junction. IMPRESSION: OG tube tip near the GE junction. This could be advanced several cm for positioning into the stomach. Electronically Signed   By: Rolm Baptise M.D.   On: 06/13/2018 18:57   Patient seen and examined.  Clinical course  and management discussed.  Necessary edits performed.  I agree with the above.  Assessment and plan of care developed and discussed below.     Assessment: 73 y.o. male with a recent history of stroke presenting unresponsive.  Head CT reviewed and shows a left frontal subacute infarct which is likely the infarct he experienced at the end of January.  There is now evidence of some hemorrhagic transformation which is new but not enough to explain the patient's current mental status.  Suspect the bigger contributor is his coexistent hypernatremia, renal and liver damage.  Patient also febrile and with an elevated white blood cell count.  Will rule out subclinical seizure activity as well and address accordingly.   Spoke with family at length.  They do not want patient to be a DNR at this time.     Stroke Risk Factors - atrial fibrillation, diabetes mellitus, hyperlipidemia and hypertension  Recommendations: 1. EEG 2. Repeat CT head without contrast 3. Would continue to hold anticoagulation.   4. Serum ammonia level 5. Agree with addressing multiple metabolic/infectious issues and multiorgan  failure  06/15/2018, 11:40 AM  Alexis Goodell, MD Neurology [phone]  06/15/2018  2:08 PM

## 2018-06-15 NOTE — Progress Notes (Signed)
eeg completed ° °

## 2018-06-15 NOTE — Progress Notes (Signed)
   06/15/18 1100  Clinical Encounter Type  Visited With Family  Visit Type Initial  Referral From Chaplain  Consult/Referral To Chaplain  Spiritual Encounters  Spiritual Needs Prayer;Emotional  Stress Factors  Patient Stress Factors Other (Comment)  Chaplain meet son Devin Ganaway.) on floor entering the ICU, asking does he have a love one in ICU. Esvin said yes his father (patient). [place] and Chaplain went to patient room, where there was other family. Chaplain offered support and encouraging words to family. Chaplain prayed with family.

## 2018-06-15 NOTE — Progress Notes (Signed)
[place] [facility] [place] NAME: Erwin Nishiyama    MR#:  [id number]  DATE OF BIRTH:  1945-10-09  SUBJECTIVE:  CHIEF COMPLAINT:   Chief Complaint  Patient presents with  . unresponsive   Patient currently sedated on the vent.  Levophed drip initiated this morning due to low blood pressure. Updated family members present at bedside  REVIEW OF SYSTEMS:  ROS Unobtainable due to patient being sedated on the vent   DRUG ALLERGIES:   Allergies  Allergen Reactions  . Penicillins Rash    Has patient had a PCN reaction causing immediate rash, facial/tongue/throat swelling, SOB or lightheadedness with hypotension: Yes Has patient had a PCN reaction causing severe rash involving mucus membranes or skin necrosis: No Has patient had a PCN reaction that required hospitalization No Has patient had a PCN reaction occurring within the last 10 years: No If all of the above answers are "NO", then may proceed with Cephalosporin use.   VITALS:  Blood pressure (!) 104/49, pulse 62, temperature (!) 100.9 F (38.3 C), resp. rate (!) 22, height 6\' 1"  (1.854 m), weight 90.2 kg, SpO2 100 %. PHYSICAL EXAMINATION:  Physical Exam  Constitutional: He appears well-developed.  Patient on the vent  HENT:  Head: Normocephalic and atraumatic.  Mouth/Throat: Oropharynx is clear and moist.  Eyes: Pupils are equal, round, and reactive to light. Conjunctivae and EOM are normal. Right eye exhibits no discharge.  Neck: Normal range of motion. Neck supple. No tracheal deviation present.  Cardiovascular: Normal rate, regular rhythm and normal heart sounds.  Respiratory: Effort normal and breath sounds normal. He has no wheezes.  GI: Bowel sounds are normal. There is no abdominal tenderness.  Musculoskeletal:        General: No edema.  Neurological:  Patient on the vent.  Full neuro examination not done at this time  Skin: Skin is warm. He is not diaphoretic. No erythema.    LABORATORY PANEL:  Male CBC Recent Labs  Lab 06/15/18 0550  WBC 26.3*  HGB 10.9*  HCT 37.0*  PLT 192   ------------------------------------------------------------------------------------------------------------------ Chemistries  Recent Labs  Lab 06/15/2018 1801 06/15/18 0550  NA 159* 159*  K 5.7* 4.9  CL 128* 130*  CO2 23 21*  GLUCOSE 482* 412*  BUN 162* 162*  CREATININE 3.47* 3.26*  CALCIUM 8.0* 7.4*  MG  --  3.3*  AST 194*  --   ALT 260*  --   ALKPHOS 156*  --   BILITOT 0.4  --    RADIOLOGY:  Ct Head Wo Contrast  Result Date: 06/15/2018 CLINICAL DATA:  73 year old male. Follow-up CVA. Subsequent encounter. EXAM: CT HEAD WITHOUT CONTRAST TECHNIQUE: Contiguous axial images were obtained from the base of the skull through the vertex without intravenous contrast. COMPARISON:  06/16/2018. FINDINGS: Brain: Large subacute partially hemorrhagic left frontal-parietal-temporal lobe infarct with surrounding edema and mild mass-effect upon the left lateral ventricle (with minimal bowing of the septum towards the right) without significant change since yesterday's exam. Remote moderate size left parietal-occipital lobe infarct with encephalomalacia. Prominent chronic microvascular changes. Global atrophy. No intracranial mass lesion noted on this unenhanced exam. Vascular: Prominent vertebral artery and carotid artery cavernous segment calcifications. Skull: No acute abnormality. Sinuses/Orbits: No acute orbital abnormality. Mild mucosal thickening ethmoid sinus air cells and maxillary sinuses. Minimal mucosal thickening anterior sphenoid sinus air cells. Mastoid air cells and middle ear cavities are clear. Other: Prominent scalp vascular calcifications. IMPRESSION: 1. Large subacute partially hemorrhagic left  frontal-parietal-temporal lobe infarct with surrounding edema and mild mass-effect upon the left lateral ventricle (with minimal bowing of the septum towards the right) without  significant change since yesterday's exam. 2. Remote moderate size left parietal-occipital lobe infarct with encephalomalacia. 3. Prominent chronic microvascular changes. 4. Global atrophy. 5. Prominent vertebral artery and carotid artery cavernous segment calcifications. 6. Mild mucosal thickening ethmoid sinus air cells and maxillary sinuses. Minimal mucosal thickening anterior sphenoid sinus air cells. Electronically Signed   By: Genia Del M.D.   On: 06/15/2018 14:46   Ct Head Wo Contrast  Result Date: 05/28/2018 CLINICAL DATA:  Altered mental status. Unresponsive. EXAM: CT HEAD WITHOUT CONTRAST TECHNIQUE: Contiguous axial images were obtained from the base of the skull through the vertex without intravenous contrast. COMPARISON:  None. FINDINGS: Brain: Ill-defined edema centered within the LEFT frontotemporal lobe, with probable extension to the LEFT parietal lobe, highly suspicious for subacute infarction. Scattered hyperdense foci within the edema is most likely hemorrhagic conversion. Associated local mass effect with sulcal effacement. No midline shift or herniation. Additional small old infarcts within the LEFT frontal lobe and LEFT occipital lobe. Chronic small vessel ischemic changes noted within the bilateral periventricular and subcortical white matter regions. Vascular: Chronic calcified atherosclerotic changes of the large vessels at the skull base. No unexpected hyperdense vessel. Skull: Normal. Negative for fracture or focal lesion. Sinuses/Orbits: No acute finding. Other: None. IMPRESSION: 1. Ill-defined edema centered within the LEFT frontotemporal lobe, highly suspicious for subacute infarction. Scattered hyperdense foci within this presumed edema is compatible with acute hemorrhagic conversion, likely a combination of acute parenchymal and subarachnoid hemorrhage. There is associated local mass effect with sulcal effacement. No midline shift or herniation. 2. Additional  chronic/incidental findings detailed above. Critical Value/emergent results were called by telephone at the time of interpretation on 06/01/2018 at 8:10 pm to Dr. [name] , who verbally acknowledged these results. Electronically Signed   By: Franki Cabot M.D.   On: 05/26/2018 20:11   Dg Chest Portable 1 View  Result Date: 05/23/2018 CLINICAL DATA:  Post intubation.  OG tube placement. EXAM: PORTABLE CHEST 1 VIEW COMPARISON:  05/30/2018 FINDINGS: Endotracheal tube has been placed and is 4 cm above the carina. OG tube tip is in the distal esophagus near the GE junction. Left AICD is unchanged. Mild cardiomegaly. Right basilar atelectasis or infiltrate. Left lung clear. No effusions or acute bony abnormality. IMPRESSION: Mild cardiomegaly.  Right base atelectasis or infiltrate. Endotracheal tube in expected position. OG tube tip is near the GE junction and could be advanced several cm for positioning into the stomach. Electronically Signed   By: Rolm Baptise M.D.   On: 05/25/2018 18:56   Dg Chest Port 1 View  Result Date: 06/09/2018 CLINICAL DATA:  Unresponsive, fever, atrial fibrillation, hypertension EXAM: PORTABLE CHEST 1 VIEW COMPARISON:  04/03/2018 FINDINGS: Stable cardiomegaly and vascular congestion. Chronic right hemidiaphragm elevation with basilar atelectasis. No definite focal pneumonia, collapse or consolidation. Negative for large effusion or pneumothorax. Trachea midline. Left subclavian defibrillator noted. IMPRESSION: Cardiomegaly with vascular congestion. Basilar atelectasis. No significant interval change. Electronically Signed   By: Jerilynn Mages.  Shick M.D.   On: 05/27/2018 18:41   Dg Abd Portable 1 View  Result Date: 06/03/2018 CLINICAL DATA:  Post intubation.  OG tube placement EXAM: PORTABLE ABDOMEN - 1 VIEW COMPARISON:  None. FINDINGS: OG tube tip is near the GE junction. IMPRESSION: OG tube tip near the GE junction. This could be advanced several cm for positioning into the stomach.  Electronically Signed   By: Rolm Baptise M.D.   On: 05/26/2018 18:57   ASSESSMENT AND PLAN:   1.  Acute hypoxic and hypercapnic respiratory failure Secondary to CVA Patient already intubated and sedated on the vent. Being managed by critical care physician.  2. Severe sepsis secondary to UTI Patient started on IV fluids and Levophed drip had to be initiated this morning.  Continue broad-spectrum IV antibiotics with IV aztreonam and vancomycin. Monitor clinically  3.  Acute kidney injury superimposed on chronic kidney disease stage 3 Follow-up on renal function. If worsening of renal function, to consider nephrology consult.  4.  Diabetes mellitus type 2; continue sliding scale insulin coverage.  Monitor blood sugars and adjust regimen  5.  Chronic systolic CHF; stable  6.  Hypertension; fairly controlled  7.  Chronic atrial fibrillation; rate controlled. No anticoagulation due to recent hemorrhagic conversion  8.  Right scrotal hydrocele Seen by urologist.  Low suspicion for intra-scrotal pathology.  To consider repeat CT scan.  Currently unstable as such no intervention planned by urologist.  Felt this is likely chronic.  9.  Recent acute CVA with hemorrhagic conversion Anticoagulation with Xarelto already discontinued.  Reversal agents documented to have been given in the emergency room. Repeat CT scan of the head done today revealed large subacute partially hemorrhagic left frontal.  Tall temporal lobe infarction with surrounding edema and mild mass-effect upon the left lateral ventricle.  No significant change since yesterday's exam. Being managed by critical care team and neurology service. EEG ordered already   10.  Nutrition Patient status post PEG tube placement following CVA recently at [facility]  DVT prophylaxis; SCDs   All the records are reviewed and case discussed with Care Management/Social Worker. Management plans discussed with the patient, family and they are in  agreement.  CODE STATUS: Full Code  TOTAL TIME TAKING CARE OF THIS PATIENT: 40 minutes.   More than 50% of the time was spent in counseling/coordination of care: YES  POSSIBLE D/C IN 3 DAYS, DEPENDING ON CLINICAL CONDITION.   Future Yeldell M.D on 06/15/2018 at 3:15 PM  Between 7am to 6pm - Pager - [phone]  After 6pm go to [email or web address] - Proofreader  [facility]  Office  [phone]  CC: Primary care physician; System, Pcp Not In  Note: This dictation was prepared with Dragon dictation along with smaller phrase technology. Any transcriptional errors that result from this process are unintentional.

## 2018-06-15 NOTE — Progress Notes (Signed)
   06/15/18 1100  Clinical Encounter Type  Visited With Family  Visit Type Initial  Referral From Chaplain  Consult/Referral To Chaplain  Spiritual Encounters  Spiritual Needs Prayer;Emotional  Stress Factors  Patient Stress Factors Other (Comment)  Chaplain followed up with patient's family with Doctor, to give the latest report on patient. Chaplain offered support and comfort.

## 2018-06-15 NOTE — Progress Notes (Signed)
Inpatient Diabetes Program Recommendations  AACE/ADA: New Consensus Statement on Inpatient Glycemic Control (2015)  Target Ranges:  Prepandial:   less than 140 mg/dL      Peak postprandial:   less than 180 mg/dL (1-2 hours)      Critically ill patients:  140 - 180 mg/dL   Lab Results  Component Value Date   GLUCAP 313 (H) 06/15/2018   HGBA1C 9.1 (H) 11/11/2015    Review of Glycemic Control Results for [place] Vance, Alex Vance (MRN [id number]) as of 06/15/2018 10:27  Ref. Range 06/06/2018 17:54 06/15/2018 01:18 06/15/2018 03:53 06/15/2018 07:27  Glucose-Capillary Latest Ref Range: 70 - 99 mg/dL 379 (H) 353 (H) 379 (H) 313 (H)   Diabetes history: DM 2 Outpatient Diabetes medications: Levemir 65 units q HS  Current orders for Inpatient glycemic control:  Novolog moderate q 4 hours Inpatient Diabetes Program Recommendations:   If appropriate, may consider starting ICU glycemic control order set- Phase 2 (IV insulin).   Thanks,  Adah Perl, RN, BC-ADM Inpatient Diabetes Coordinator Pager [phone] (8a-5p)

## 2018-06-15 NOTE — Progress Notes (Signed)
eLink Physician-Brief Progress Note Patient Name: [place] Vance DOB: 04-08-1946 MRN: [id number]   Date of Service  06/15/2018  HPI/Events of Note  73 y.o. male brought to the ED due to unresponsiveness.  He was recently discharged from [facility] after having significant stroke and a long stay pending with the patient being completely aphasic, and nonambulatory.  He had PEG tube placed.  Today he was brought to the ED because he was unresponsive and family found that his glucose was elevated.  Here in the ED evaluation shows a new stroke with early hemorrhagic transformation.  Patient has chronic A. fib and was on Xarelto.  Reversal agents were given in the ED.  Given his mental status he required intubation.  Now intubated and ventilated. PCCM asked to assume care in ICU. VSS  eICU Interventions  No new orders.      Intervention Category Evaluation Type: New Patient Evaluation  [name] 06/15/2018, 1:08 AM

## 2018-06-15 NOTE — Progress Notes (Signed)
Per neurology PA wants CT now. Aware that it has not been 24 hours.

## 2018-06-15 NOTE — Progress Notes (Signed)
Transported patient to and from CT with no issues. 

## 2018-06-15 NOTE — Progress Notes (Signed)
[facility] called [phone]. GCS 5

## 2018-06-15 NOTE — Progress Notes (Signed)
Transported pt to ICU on vent without incident. Pt remains on vent. Report given to ICU RT.

## 2018-06-16 ENCOUNTER — Inpatient Hospital Stay: Payer: Medicare Other

## 2018-06-16 DIAGNOSIS — J9601 Acute respiratory failure with hypoxia: Secondary | ICD-10-CM

## 2018-06-16 DIAGNOSIS — N170 Acute kidney failure with tubular necrosis: Secondary | ICD-10-CM

## 2018-06-16 DIAGNOSIS — N185 Chronic kidney disease, stage 5: Secondary | ICD-10-CM

## 2018-06-16 DIAGNOSIS — L8990 Pressure ulcer of unspecified site, unspecified stage: Secondary | ICD-10-CM

## 2018-06-16 LAB — BLOOD GAS, ARTERIAL
Acid-base deficit: 8.5 mmol/L — ABNORMAL HIGH (ref 0.0–2.0)
BICARBONATE: 17.3 mmol/L — AB (ref 20.0–28.0)
FIO2: 0.4
MECHVT: 500 mL
Mechanical Rate: 16
O2 Saturation: 99.1 %
PEEP/CPAP: 5 cmH2O
Patient temperature: 37
pCO2 arterial: 36 mmHg (ref 32.0–48.0)
pH, Arterial: 7.29 — ABNORMAL LOW (ref 7.350–7.450)
pO2, Arterial: 152 mmHg — ABNORMAL HIGH (ref 83.0–108.0)

## 2018-06-16 LAB — HIV ANTIBODY (ROUTINE TESTING W REFLEX): HIV SCREEN 4TH GENERATION: NONREACTIVE

## 2018-06-16 LAB — COMPREHENSIVE METABOLIC PANEL
ALT: 145 U/L — ABNORMAL HIGH (ref 0–44)
AST: 81 U/L — ABNORMAL HIGH (ref 15–41)
Albumin: 2.3 g/dL — ABNORMAL LOW (ref 3.5–5.0)
Alkaline Phosphatase: 129 U/L — ABNORMAL HIGH (ref 38–126)
BUN: 175 mg/dL — ABNORMAL HIGH (ref 8–23)
CO2: 20 mmol/L — AB (ref 22–32)
Calcium: 7.8 mg/dL — ABNORMAL LOW (ref 8.9–10.3)
Chloride: >130 mmol/L (ref 98–111)
Creatinine, Ser: 3.89 mg/dL — ABNORMAL HIGH (ref 0.61–1.24)
GFR, EST AFRICAN AMERICAN: 17 mL/min — AB (ref >60)
GFR, EST NON AFRICAN AMERICAN: 15 mL/min — AB (ref >60)
Glucose, Bld: 282 mg/dL — ABNORMAL HIGH (ref 70–99)
Potassium: 5.2 mmol/L — ABNORMAL HIGH (ref 3.5–5.1)
SODIUM: 165 mmol/L — AB (ref 135–145)
Total Bilirubin: 1.4 mg/dL — ABNORMAL HIGH (ref 0.3–1.2)
Total Protein: 6 g/dL — ABNORMAL LOW (ref 6.5–8.1)

## 2018-06-16 LAB — CBC
HCT: 34.4 % — ABNORMAL LOW (ref 39.0–52.0)
Hemoglobin: 10.1 g/dL — ABNORMAL LOW (ref 13.0–17.0)
MCH: 24.9 pg — ABNORMAL LOW (ref 26.0–34.0)
MCHC: 29.4 g/dL — ABNORMAL LOW (ref 30.0–36.0)
MCV: 84.7 fL (ref 80.0–100.0)
Platelets: 184 10*3/uL (ref 150–400)
RBC: 4.06 MIL/uL — ABNORMAL LOW (ref 4.22–5.81)
RDW: 20.8 % — ABNORMAL HIGH (ref 11.5–15.5)
WBC: 33.1 10*3/uL — ABNORMAL HIGH (ref 4.0–10.5)
nRBC: 0 % (ref 0.0–0.2)

## 2018-06-16 LAB — PROTIME-INR
INR: 1.4 — AB (ref 0.8–1.2)
Prothrombin Time: 16.6 seconds — ABNORMAL HIGH (ref 11.4–15.2)

## 2018-06-16 LAB — AMMONIA: Ammonia: 13 umol/L (ref 9–35)

## 2018-06-16 LAB — GLUCOSE, CAPILLARY
Glucose-Capillary: 244 mg/dL — ABNORMAL HIGH (ref 70–99)
Glucose-Capillary: 238 mg/dL — ABNORMAL HIGH (ref 70–99)
Glucose-Capillary: 283 mg/dL — ABNORMAL HIGH (ref 70–99)
Glucose-Capillary: 290 mg/dL — ABNORMAL HIGH (ref 70–99)

## 2018-06-16 MED ORDER — SODIUM CHLORIDE 0.9 % IV SOLN
500.0000 mg | Freq: Two times a day (BID) | INTRAVENOUS | Status: DC
  Administered 2018-06-16 – 2018-06-19 (×6): 500 mg via INTRAVENOUS
  Filled 2018-06-16 (×3): qty 500
  Filled 2018-06-16 (×2): qty 0.5
  Filled 2018-06-16: qty 500
  Filled 2018-06-16: qty 0.5
  Filled 2018-06-16: qty 500

## 2018-06-16 MED ORDER — SODIUM CHLORIDE 0.9 % IV SOLN
INTRAVENOUS | Status: DC | PRN
  Administered 2018-06-16: 250 mL via INTRAVENOUS

## 2018-06-16 MED ORDER — FREE WATER
200.0000 mL | Status: DC
  Administered 2018-06-16 – 2018-06-17 (×6): 200 mL

## 2018-06-16 MED ORDER — FENTANYL CITRATE (PF) 100 MCG/2ML IJ SOLN: 50.0000 ug | INTRAMUSCULAR | Status: DC | PRN

## 2018-06-16 MED ORDER — SODIUM BICARBONATE 8.4 % IV SOLN
50.0000 meq | Freq: Once | INTRAVENOUS | Status: AC
  Administered 2018-06-16: 50 meq via INTRAVENOUS
  Filled 2018-06-16: qty 50

## 2018-06-16 MED ORDER — VANCOMYCIN HCL IN DEXTROSE 750-5 MG/150ML-% IV SOLN
750.0000 mg | INTRAVENOUS | Status: DC
  Filled 2018-06-16: qty 150

## 2018-06-16 MED ORDER — MIDAZOLAM HCL 2 MG/2ML IJ SOLN: 1.0000 mg | INTRAMUSCULAR | Status: DC | PRN

## 2018-06-16 MED ORDER — MIDAZOLAM HCL 2 MG/2ML IJ SOLN
1.0000 mg | INTRAMUSCULAR | Status: DC | PRN
  Administered 2018-06-16: 1 mg via INTRAVENOUS
  Filled 2018-06-16: qty 2

## 2018-06-16 NOTE — Progress Notes (Signed)
[place] at [place] NAME: [place] Vance    MR#:  [id number]  DATE OF BIRTH:  06/02/1945  SUBJECTIVE:  CHIEF COMPLAINT:   Chief Complaint  Patient presents with  . unresponsive   Patient currently sedated on the vent.  Levophed drip initiated this morning due to low blood pressure. Updated family members present at bedside  REVIEW OF SYSTEMS:  ROS Unobtainable due to patient being sedated on the vent   DRUG ALLERGIES:   Allergies  Allergen Reactions  . Penicillins Rash    Has patient had a PCN reaction causing immediate rash, facial/tongue/throat swelling, SOB or lightheadedness with hypotension: Yes Has patient had a PCN reaction causing severe rash involving mucus membranes or skin necrosis: No Has patient had a PCN reaction that required hospitalization No Has patient had a PCN reaction occurring within the last 10 years: No If all of the above answers are "NO", then may proceed with Cephalosporin use.   VITALS:  Blood pressure (!) 124/52, pulse (!) 101, temperature 98.2 F (36.8 C), resp. rate (!) 22, height 6\' 1"  (1.854 m), weight 86.6 kg, SpO2 100 %. PHYSICAL EXAMINATION:  Physical Exam  Constitutional: He appears well-developed.  Patient on the vent  HENT:  Head: Normocephalic and atraumatic.  Mouth/Throat: Oropharynx is clear and moist.  Eyes: Pupils are equal, round, and reactive to light. Conjunctivae and EOM are normal. Right eye exhibits no discharge.  Neck: Normal range of motion. Neck supple. No tracheal deviation present.  Cardiovascular: Normal rate, regular rhythm and normal heart sounds.  Respiratory: Effort normal and breath sounds normal. He has no wheezes.  GI: Bowel sounds are normal. There is no abdominal tenderness.  Musculoskeletal:        General: No edema.  Neurological:  Patient on the vent.  Full neuro examination not done at this time  Skin: Skin is warm. He is not diaphoretic. No erythema.    LABORATORY PANEL:  Male CBC Recent Labs  Lab 06/16/18 0604  WBC 33.1*  HGB 10.1*  HCT 34.4*  PLT 184   ------------------------------------------------------------------------------------------------------------------ Chemistries  Recent Labs  Lab 06/15/18 0550 06/16/18 0604  NA 159* 165*  K 4.9 5.2*  CL 130* >130*  CO2 21* 20*  GLUCOSE 412* 282*  BUN 162* 175*  CREATININE 3.26* 3.89*  CALCIUM 7.4* 7.8*  MG 3.3*  --   AST  --  81*  ALT  --  145*  ALKPHOS  --  129*  BILITOT  --  1.4*   RADIOLOGY:  Dg Chest Port 1 View  Result Date: 06/16/2018 CLINICAL DATA:  Acute respiratory failure EXAM: PORTABLE CHEST 1 VIEW COMPARISON:  05/24/2018 FINDINGS: Cardiac shadow is stable. Defibrillator is again noted in satisfactory position. Endotracheal tube and gastric catheter are noted in satisfactory position. The lungs are well aerated bilaterally. No focal infiltrate or sizable effusion is seen. No acute bony abnormality is noted. IMPRESSION: Tubes and lines as described above.  No infiltrate is seen. Electronically Signed   By: Inez Catalina M.D.   On: 06/16/2018 07:35   ASSESSMENT AND PLAN:   1.  Acute hypoxic and hypercapnic respiratory failure Secondary to CVA Patient already intubated on the vent.  Being managed by critical care physician.  2. Severe sepsis secondary to UTI Patient started on IV fluids and Levophed drip had to be initiated this morning.  Continue broad-spectrum IV antibiotics with IV aztreonam and vancomycin.  Patient with worsening leukocytosis. Monitor clinically  3.  Acute kidney injury superimposed on chronic kidney disease stage 3 Follow-up on renal function.  Renal function worsening. Palliative care team already consulted and plans for family meeting to address goals of care already scheduled for tomorrow. Plan is for nephrology involvement if patient wishes to proceed with further aggressive management such as hemodialysis. Currently being  managed by critical care team.  4.  Diabetes mellitus type 2; continue sliding scale insulin coverage.  Monitor blood sugars and adjust regimen  5.  Chronic systolic CHF; stable  6.  Hypertension; fairly controlled  7.  Chronic atrial fibrillation; rate controlled. No anticoagulation due to recent hemorrhagic conversion  8.  Right scrotal hydrocele Seen by urologist.  Low suspicion for intra-scrotal pathology.  To consider repeat CT scan.  Currently unstable as such no intervention planned by urologist.  Felt this is likely chronic.  9.  Recent acute CVA with hemorrhagic conversion Anticoagulation with Xarelto already discontinued.  Reversal agents documented to have been given in the emergency room. Repeat CT scan of the head done recently revealed large subacute partially hemorrhagic left frontal.  Tall temporal lobe infarction with surrounding edema and mild mass-effect upon the left lateral ventricle.  No significant change since yesterday's exam. Being managed by critical care team and neurology service. EEG ordered already   10.  Nutrition Patient status post PEG tube placement following CVA recently at [facility]  11.  Hyponatremia Nursing staff about to initiate water to be administered via NG tube at 200 cc daily 6 hourly.  Being managed by critical care team.  DVT prophylaxis; SCDs   All the records are reviewed and case discussed with Care Management/Social Worker. Management plans discussed with the patient, family and they are in agreement.  CODE STATUS: Full Code  TOTAL TIME TAKING CARE OF THIS PATIENT: 35 minutes.   More than 50% of the time was spent in counseling/coordination of care: YES  POSSIBLE D/C IN 3 DAYS, DEPENDING ON CLINICAL CONDITION.   [place] Vance M.D on 06/16/2018 at 3:45 PM  Between 7am to 6pm - Pager - [phone]  After 6pm go to [email or web address] - Proofreader  [facility] [place] [facility]  Office  [phone]  CC: Primary care  physician; System, Pcp Not In  Note: This dictation was prepared with Dragon dictation along with smaller phrase technology. Any transcriptional errors that result from this process are unintentional.

## 2018-06-16 NOTE — Progress Notes (Signed)
AT this time, patient has multiorgan failure.  The family did NOT want to place vasc cath at this time.  Patient needs aggressive medical therapy to survive.  Palliative will meet family tomorrow. The son of patient wants to pursue comfort care measures but the sisters of the patient have a different opinion.  AT this time, the family may pursue comfort care measures in 24 hrs.  At this time,  Patient remains full code but family does NOT wish to pursue dialysis at this time.  In this situation, there is no reason to obtain blood draws and check FSBS as this patient has very poor chance of meaningful recovery.  Patient is in the dying process.  Case discussed with ICU staff     Corrin Parker, M.D.  [facility] Pulmonary & Critical Care Medicine  Medical Director [place] Director [facility] Cardio-Pulmonary Department

## 2018-06-16 NOTE — Consult Note (Signed)
Pharmacy Antibiotic Note  [place] Vance is a 73 y.o. male admitted on 05/27/2018 with sepsis (unknown source-though nursing noted raw sores on patients scrotal area and back of legs)  Pharmacy has been RE-consulted for Vancomycin dosing.  Plan: Patient received vanc 2g IV load x 1 -- vanc was d/c'd no other doses given, then we were reconsulted. Vancomycin 750 mg IV Q 24 hrs. Goal AUC 400-550. Expected AUC: 497.6 SCr used: 3.26 mg/dL Css 15.7 mcg/mL  Height: 6\' 1"  (185.4 cm) Weight: 190 lb 14.7 oz (86.6 kg) IBW/kg (Calculated) : 79.9  Temp (24hrs), Avg:98.9 F (37.2 C), Min:97.3 F (36.3 C), Max:102 F (38.9 C)  Recent Labs  Lab 05/23/2018 1801 05/26/2018 2130 06/15/18 0550 06/16/18 0604  WBC 23.5*  --  26.3* 33.1*  CREATININE 3.47*  --  3.26*  --   LATICACIDVEN 2.8* 2.4*  --   --     Estimated Creatinine Clearance: 23.8 mL/min (A) (by C-G formula based on SCr of 3.26 mg/dL (H)).    Allergies  Allergen Reactions  . Penicillins Rash    Has patient had a PCN reaction causing immediate rash, facial/tongue/throat swelling, SOB or lightheadedness with hypotension: Yes Has patient had a PCN reaction causing severe rash involving mucus membranes or skin necrosis: No Has patient had a PCN reaction that required hospitalization No Has patient had a PCN reaction occurring within the last 10 years: No If all of the above answers are "NO", then may proceed with Cephalosporin use.    Antimicrobials this admission: 2/24 Aztreonam >> 2/24 Vancomycin >>   Dose adjustments this admission: None  Microbiology results: 2/24 BCx: pending 2/24 UCx: pending   Thank you for allowing pharmacy to be a part of this patient's care.  Tobie Lords, PharmD, BCPS Clinical Pharmacist 06/16/2018 7:14 AM

## 2018-06-16 NOTE — Progress Notes (Signed)
Subjective: Patient remains encephalopathic. Currently intubated and sedated. He is minimally responsive on the left with noxious stimuli. He will grimace, extend left upper and withdraw left lower extremity to noxious stimuli. Family currently at the bedside.  Objective: Current vital signs: BP (!) 122/53   Pulse (!) 25   Temp 98.2 F (36.8 C)   Resp (!) 25   Ht '6\' 1"'$  (1.854 m)   Wt 86.6 kg   SpO2 99%   BMI 25.19 kg/m  Vital signs in last 24 hours: Temp:  [97.3 F (36.3 C)-102 F (38.9 C)] 98.2 F (36.8 C) (02/26 0900) Pulse Rate:  [25-116] 25 (02/26 0900) Resp:  [19-33] 25 (02/26 0900) BP: (79-144)/(25-98) 122/53 (02/26 0900) SpO2:  [81 %-100 %] 99 % (02/26 0900) FiO2 (%):  [30 %-40 %] 30 % (02/26 0800) Weight:  [86.6 kg] 86.6 kg (02/26 0500)  Intake/Output from previous day: 02/25 0701 - 02/26 0700 In: 813.9 [I.V.:163.5; IV Piggyback:650.4] Out: 1060 [Urine:910; Emesis/NG output:150] Intake/Output this shift: Total I/O In: 49.1 [I.V.:49.1] Out: 125 [Urine:125] Nutritional status:  Diet Order            Diet NPO time specified  Diet effective now             Mental Status: Patient does not respond to verbal stimuli.  Grimaces to deep sternal rub.  Does not follow commands.  No verbalizations noted.  Cranial Nerves: II: Patient does not respond confrontation bilaterally, pupils right 2 mm, left 2 mm,and reactive bilaterally III,IV,VI: doll's response present bilaterally. V,VII: corneal reflex present bilaterally  VIII: patient does not respond to verbal stimuli IX,X: gag reflex present, XI: trapezius strength unable to test bilaterally XII: tongue strength unable to test Motor: Extremities flaccid on the right.  No spontaneous or purposeful movements noted on the left. Extends LUE to noxious stimuli. Withdraws LLE to noxious stimuli Sensory: Respond to noxious stimuli in LUE and LLE Deep Tendon Reflexes:  Absent throughout. Plantars: mute  bilaterally Cerebellar: Unable to perform  Lab Results: Basic Metabolic Panel: Recent Labs  Lab 06/17/2018 1801 06/15/18 0550 06/16/18 0604  NA 159* 159* 165*  K 5.7* 4.9 5.2*  CL 128* 130* >130*  CO2 23 21* 20*  GLUCOSE 482* 412* 282*  BUN 162* 162* 175*  CREATININE 3.47* 3.26* 3.89*  CALCIUM 8.0* 7.4* 7.8*  MG  --  3.3*  --     Liver Function Tests: Recent Labs  Lab 06/18/2018 1801 06/16/18 0604  AST 194* 81*  ALT 260* 145*  ALKPHOS 156* 129*  BILITOT 0.4 1.4*  PROT 6.2* 6.0*  ALBUMIN 2.4* 2.3*   No results for input(s): LIPASE, AMYLASE in the last 168 hours. Recent Labs  Lab 06/16/18 0604  AMMONIA 13    CBC: Recent Labs  Lab 06/17/2018 1801 06/15/18 0550 06/16/18 0604  WBC 23.5* 26.3* 33.1*  NEUTROABS 19.8*  --   --   HGB 12.2* 10.9* 10.1*  HCT 40.4 37.0* 34.4*  MCV 82.4 84.3 84.7  PLT 252 192 184    Cardiac Enzymes: No results for input(s): CKTOTAL, CKMB, CKMBINDEX, TROPONINI in the last 168 hours.  Lipid Panel: No results for input(s): CHOL, TRIG, HDL, CHOLHDL, VLDL, LDLCALC in the last 168 hours.  CBG: Recent Labs  Lab 06/15/18 1132 06/15/18 1619 06/15/18 1954 06/16/18 0347 06/16/18 0731  GLUCAP 185* 116* 158* 244* 238*    Microbiology: Results for orders placed or performed during the hospital encounter of 06/17/2018  Urine culture  Status: Abnormal (Preliminary result)   Collection Time: 06/06/2018  5:41 PM  Result Value Ref Range Status   Specimen Description   Final    URINE, RANDOM Performed at [facility], [address] [place], [place] [address]    Special Requests   Final    NONE Performed at [facility], [address] [place], [place] [address]    Culture >=100,000 COLONIES/mL GRAM NEGATIVE RODS (A)  Final   Report Status PENDING  Incomplete  Blood Culture (routine x 2)     Status: None (Preliminary result)   Collection Time: 05/27/2018  6:01 PM  Result Value Ref Range Status   Specimen  Description BLOOD LEFT ANTECUBITAL  Final   Special Requests   Final    BOTTLES DRAWN AEROBIC AND ANAEROBIC Blood Culture adequate volume   Culture   Final    NO GROWTH 2 DAYS Performed at [facility], [address] [place], [place] [address]    Report Status PENDING  Incomplete  Blood Culture (routine x 2)     Status: None (Preliminary result)   Collection Time: 06/05/2018  6:01 PM  Result Value Ref Range Status   Specimen Description BLOOD BLOOD RIGHT WRIST  Final   Special Requests   Final    BOTTLES DRAWN AEROBIC AND ANAEROBIC Blood Culture adequate volume   Culture   Final    NO GROWTH 2 DAYS Performed at [facility], [address], [place], [place] [address]    Report Status PENDING  Incomplete  MRSA PCR Screening     Status: None   Collection Time: 06/15/18 12:50 AM  Result Value Ref Range Status   MRSA by PCR NEGATIVE NEGATIVE Final    Comment:        The GeneXpert MRSA Assay (FDA approved for NASAL specimens only), is one component of a comprehensive MRSA colonization surveillance program. It is not intended to diagnose MRSA infection nor to guide or monitor treatment for MRSA infections. Performed at [facility], [address] [place], [place] [address]     Coagulation Studies: Recent Labs    06/13/2018 1900  LABPROT 16.4*  INR 1.3    Imaging: Ct Head Wo Contrast  Result Date: 06/15/2018 CLINICAL DATA:  73 year old male. Follow-up CVA. Subsequent encounter. EXAM: CT HEAD WITHOUT CONTRAST TECHNIQUE: Contiguous axial images were obtained from the base of the skull through the vertex without intravenous contrast. COMPARISON:  05/24/2018. FINDINGS: Brain: Large subacute partially hemorrhagic left frontal-parietal-temporal lobe infarct with surrounding edema and mild mass-effect upon the left lateral ventricle (with minimal bowing of the septum towards the right) without significant change since yesterday's exam. Remote moderate  size left parietal-occipital lobe infarct with encephalomalacia. Prominent chronic microvascular changes. Global atrophy. No intracranial mass lesion noted on this unenhanced exam. Vascular: Prominent vertebral artery and carotid artery cavernous segment calcifications. Skull: No acute abnormality. Sinuses/Orbits: No acute orbital abnormality. Mild mucosal thickening ethmoid sinus air cells and maxillary sinuses. Minimal mucosal thickening anterior sphenoid sinus air cells. Mastoid air cells and middle ear cavities are clear. Other: Prominent scalp vascular calcifications. IMPRESSION: 1. Large subacute partially hemorrhagic left frontal-parietal-temporal lobe infarct with surrounding edema and mild mass-effect upon the left lateral ventricle (with minimal bowing of the septum towards the right) without significant change since yesterday's exam. 2. Remote moderate size left parietal-occipital lobe infarct with encephalomalacia. 3. Prominent chronic microvascular changes. 4. Global atrophy. 5. Prominent vertebral artery and carotid artery cavernous segment calcifications. 6. Mild  mucosal thickening ethmoid sinus air cells and maxillary sinuses. Minimal mucosal thickening anterior sphenoid sinus air cells. Electronically Signed   By: Genia Del M.D.   On: 06/15/2018 14:46   Ct Head Wo Contrast  Result Date: 06/13/2018 CLINICAL DATA:  Altered mental status. Unresponsive. EXAM: CT HEAD WITHOUT CONTRAST TECHNIQUE: Contiguous axial images were obtained from the base of the skull through the vertex without intravenous contrast. COMPARISON:  None. FINDINGS: Brain: Ill-defined edema centered within the LEFT frontotemporal lobe, with probable extension to the LEFT parietal lobe, highly suspicious for subacute infarction. Scattered hyperdense foci within the edema is most likely hemorrhagic conversion. Associated local mass effect with sulcal effacement. No midline shift or herniation. Additional small old infarcts  within the LEFT frontal lobe and LEFT occipital lobe. Chronic small vessel ischemic changes noted within the bilateral periventricular and subcortical white matter regions. Vascular: Chronic calcified atherosclerotic changes of the large vessels at the skull base. No unexpected hyperdense vessel. Skull: Normal. Negative for fracture or focal lesion. Sinuses/Orbits: No acute finding. Other: None. IMPRESSION: 1. Ill-defined edema centered within the LEFT frontotemporal lobe, highly suspicious for subacute infarction. Scattered hyperdense foci within this presumed edema is compatible with acute hemorrhagic conversion, likely a combination of acute parenchymal and subarachnoid hemorrhage. There is associated local mass effect with sulcal effacement. No midline shift or herniation. 2. Additional chronic/incidental findings detailed above. Critical Value/emergent results were called by telephone at the time of interpretation on 05/26/2018 at 8:10 pm to Dr. [name] , who verbally acknowledged these results. Electronically Signed   By: Franki Cabot M.D.   On: 05/27/2018 20:11   Dg Chest Port 1 View  Result Date: 06/16/2018 CLINICAL DATA:  Acute respiratory failure EXAM: PORTABLE CHEST 1 VIEW COMPARISON:  06/17/2018 FINDINGS: Cardiac shadow is stable. Defibrillator is again noted in satisfactory position. Endotracheal tube and gastric catheter are noted in satisfactory position. The lungs are well aerated bilaterally. No focal infiltrate or sizable effusion is seen. No acute bony abnormality is noted. IMPRESSION: Tubes and lines as described above.  No infiltrate is seen. Electronically Signed   By: Inez Catalina M.D.   On: 06/16/2018 07:35   Dg Chest Portable 1 View  Result Date: 05/30/2018 CLINICAL DATA:  Post intubation.  OG tube placement. EXAM: PORTABLE CHEST 1 VIEW COMPARISON:  05/23/2018 FINDINGS: Endotracheal tube has been placed and is 4 cm above the carina. OG tube tip is in the distal esophagus near  the GE junction. Left AICD is unchanged. Mild cardiomegaly. Right basilar atelectasis or infiltrate. Left lung clear. No effusions or acute bony abnormality. IMPRESSION: Mild cardiomegaly.  Right base atelectasis or infiltrate. Endotracheal tube in expected position. OG tube tip is near the GE junction and could be advanced several cm for positioning into the stomach. Electronically Signed   By: Rolm Baptise M.D.   On: 06/01/2018 18:56   Dg Chest Port 1 View  Result Date: 06/13/2018 CLINICAL DATA:  Unresponsive, fever, atrial fibrillation, hypertension EXAM: PORTABLE CHEST 1 VIEW COMPARISON:  04/03/2018 FINDINGS: Stable cardiomegaly and vascular congestion. Chronic right hemidiaphragm elevation with basilar atelectasis. No definite focal pneumonia, collapse or consolidation. Negative for large effusion or pneumothorax. Trachea midline. Left subclavian defibrillator noted. IMPRESSION: Cardiomegaly with vascular congestion. Basilar atelectasis. No significant interval change. Electronically Signed   By: Jerilynn Mages.  Shick M.D.   On: 05/29/2018 18:41   Dg Abd Portable 1 View  Result Date: 05/31/2018 CLINICAL DATA:  Post intubation.  OG tube placement EXAM: PORTABLE ABDOMEN -  1 VIEW COMPARISON:  None. FINDINGS: OG tube tip is near the GE junction. IMPRESSION: OG tube tip near the GE junction. This could be advanced several cm for positioning into the stomach. Electronically Signed   By: Rolm Baptise M.D.   On: 06/01/2018 18:57   Medications:  I have reviewed the patient's current medications. Prior to Admission:  Medications Prior to Admission  Medication Sig Dispense Refill Last Dose  . acetaminophen (TYLENOL) 325 MG tablet Place 650 mg into feeding tube every 6 (six) hours as needed.   prn at prn  . furosemide (LASIX) 20 MG tablet Take 2 tablets (40 mg total) by mouth daily. (Patient taking differently: Take 80 mg by mouth 2 (two) times daily. ) 4 tablet 0 unknown at unknown  . gabapentin (NEURONTIN) 600 MG  tablet Place 600 mg into feeding tube 3 (three) times daily.    05/24/2018 at 0800  . Insulin Detemir (LEVEMIR FLEXTOUCH) 100 UNIT/ML Pen Inject 65 Units into the skin at bedtime.    06/13/2018 at 2000  . losartan (COZAAR) 25 MG tablet Place 50 mg into feeding tube daily.    unknown at unknown  . XARELTO 20 MG TABS tablet Take 20 mg by mouth daily with supper.    06/06/2018 at 0800   Scheduled: . chlorhexidine gluconate (MEDLINE KIT)  15 mL Mouth Rinse BID  . insulin aspart  0-15 Units Subcutaneous Q4H  . mouth rinse  15 mL Mouth Rinse 10 times per day   Patient seen and examined.  Clinical course and management discussed.  Necessary edits performed.  I agree with the above.  Assessment and plan of care developed and discussed below.    Assessment: Alex Vance is an 73 y.o. 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 CT head showed large subacute partially hemorrhagic left frontal-parietal-temporal infarct likely hemorraghic transformation of prior stroke. Patient found to have multiorgan failure - renal and liver failure likely cause of encephalopathy. EEG performed and showed background slowing otherwise no epileptiform activity noted.  Head CT repeated and shows no progression of hemorrhage.   Conversation had at length with family concerning patient's condition and prognosis.  They wish to proceed further with evaluation for possible dialysis but they still need to get input from all of the children.    Plan: 1. Hold anticoagulation and antiplatelet due to risk of further hemorrhagic transformation of stroke 2. Would consider nephrology involvement once family in agreement  This patient was staffed with Dr. Magda Paganini, Doy Mince who personally evaluated patient, reviewed documentation and agreed with  assessment and plan of care as above.  Rufina Falco, DNP, FNP-BC Board certified Nurse Practitioner Neurology Department   LOS: 2 days   06/16/2018  9:43 AM  Alexis Goodell, MD Neurology [phone]  06/16/2018  12:57 PM

## 2018-06-16 NOTE — Progress Notes (Signed)
Name: Alex Vance MRN: [id number] DOB: [birth year]     CONSULTATION DATE: 05/25/2018  REFERRING MD :  Jannifer Franklin  CHIEF COMPLAINT:  resp failure  STUDIES:  CT head -Ill-defined edema centered within the LEFT frontotemporal lobe, highly suspicious for subacute infarction. Scattered hyperdense foci within this presumed edema is compatible with acute hemorrhagic conversion, likely a combination of acute parenchymal and subarachnoid hemorrhage. There is associated local mass effect with sulcal effacement. No midline shift or herniation.   HISTORY OF PRESENT ILLNESS:   Remains critically ill Multiorgan failure-renal failure and liver failure Resp failure on vent  remains obtunded Prognosis is GRAVE  High  Risk for cardiac arrest     REVIEW OF SYSTEMS  PATIENT IS UNABLE TO PROVIDE COMPLETE REVIEW OF SYSTEM S DUE TO SEVERE CRITICAL ILLNESS AND ENCEPHALOPATHY     VITAL SIGNS: Temp:  [97.3 F (36.3 C)-102 F (38.9 C)] 97.9 F (36.6 C) (02/26 0800) Pulse Rate:  [26-116] 75 (02/26 0800) Resp:  [19-33] 22 (02/26 0800) BP: (79-144)/(25-98) 110/46 (02/26 0800) SpO2:  [81 %-100 %] 100 % (02/26 0800) FiO2 (%):  [30 %-40 %] 30 % (02/26 0800) Weight:  [86.6 kg] 86.6 kg (02/26 0500)  PHYSICAL EXAMINATION:  GENERAL:critically ill appearing, +resp distress HEAD: Normocephalic, atraumatic.  EYES: Pupils equal, round, reactive to light.  No scleral icterus.  MOUTH: Moist mucosal membrane. NECK: Supple. No thyromegaly. No nodules. No JVD.  PULMONARY: +rhonchi, +wheezing CARDIOVASCULAR: S1 and S2. Regular rate and rhythm. No murmurs, rubs, or gallops.  GASTROINTESTINAL: Soft, nontender, -distended. No masses. Positive bowel sounds. No hepatosplenomegaly.  MUSCULOSKELETAL: No swelling, clubbing, or edema.  NEUROLOGIC: obtunded SKIN:intact,warm,dry      CULTURE RESULTS   Recent Results (from the past 240 hour(s))  Blood Culture (routine x 2)     Status: None  (Preliminary result)   Collection Time: 06/16/2018  6:01 PM  Result Value Ref Range Status   Specimen Description BLOOD LEFT ANTECUBITAL  Final   Special Requests   Final    BOTTLES DRAWN AEROBIC AND ANAEROBIC Blood Culture adequate volume   Culture   Final    NO GROWTH 2 DAYS Performed at [facility], [address], [place], [place] [address]    Report Status PENDING  Incomplete  Blood Culture (routine x 2)     Status: None (Preliminary result)   Collection Time: 05/23/2018  6:01 PM  Result Value Ref Range Status   Specimen Description BLOOD BLOOD RIGHT WRIST  Final   Special Requests   Final    BOTTLES DRAWN AEROBIC AND ANAEROBIC Blood Culture adequate volume   Culture   Final    NO GROWTH 2 DAYS Performed at [facility], [address] [place], [place] [address]    Report Status PENDING  Incomplete  MRSA PCR Screening     Status: None   Collection Time: 06/15/18 12:50 AM  Result Value Ref Range Status   MRSA by PCR NEGATIVE NEGATIVE Final    Comment:        The GeneXpert MRSA Assay (FDA approved for NASAL specimens only), is one component of a comprehensive MRSA colonization surveillance program. It is not intended to diagnose MRSA infection nor to guide or monitor treatment for MRSA infections. Performed at [facility], [place],  [address]           IMAGING    Ct Head Wo Contrast  Result Date: 06/15/2018 CLINICAL DATA:  73 year old male. Follow-up CVA. Subsequent  encounter. EXAM: CT HEAD WITHOUT CONTRAST TECHNIQUE: Contiguous axial images were obtained from the base of the skull through the vertex without intravenous contrast. COMPARISON:  05/25/2018. FINDINGS: Brain: Large subacute partially hemorrhagic left frontal-parietal-temporal lobe infarct with surrounding edema and mild mass-effect upon the left lateral ventricle (with minimal bowing of the septum towards the right) without significant change since  yesterday's exam. Remote moderate size left parietal-occipital lobe infarct with encephalomalacia. Prominent chronic microvascular changes. Global atrophy. No intracranial mass lesion noted on this unenhanced exam. Vascular: Prominent vertebral artery and carotid artery cavernous segment calcifications. Skull: No acute abnormality. Sinuses/Orbits: No acute orbital abnormality. Mild mucosal thickening ethmoid sinus air cells and maxillary sinuses. Minimal mucosal thickening anterior sphenoid sinus air cells. Mastoid air cells and middle ear cavities are clear. Other: Prominent scalp vascular calcifications. IMPRESSION: 1. Large subacute partially hemorrhagic left frontal-parietal-temporal lobe infarct with surrounding edema and mild mass-effect upon the left lateral ventricle (with minimal bowing of the septum towards the right) without significant change since yesterday's exam. 2. Remote moderate size left parietal-occipital lobe infarct with encephalomalacia. 3. Prominent chronic microvascular changes. 4. Global atrophy. 5. Prominent vertebral artery and carotid artery cavernous segment calcifications. 6. Mild mucosal thickening ethmoid sinus air cells and maxillary sinuses. Minimal mucosal thickening anterior sphenoid sinus air cells. Electronically Signed   By: Genia Del M.D.   On: 06/15/2018 14:46   Dg Chest Port 1 View  Result Date: 06/16/2018 CLINICAL DATA:  Acute respiratory failure EXAM: PORTABLE CHEST 1 VIEW COMPARISON:  06/02/2018 FINDINGS: Cardiac shadow is stable. Defibrillator is again noted in satisfactory position. Endotracheal tube and gastric catheter are noted in satisfactory position. The lungs are well aerated bilaterally. No focal infiltrate or sizable effusion is seen. No acute bony abnormality is noted. IMPRESSION: Tubes and lines as described above.  No infiltrate is seen. Electronically Signed   By: Inez Catalina M.D.   On: 06/16/2018 07:35         Indwelling Urinary Catheter  continued, requirement due to   Reason to continue Indwelling Urinary Catheter for strict Intake/Output monitoring for hemodynamic instability         Ventilator continued, requirement due to, resp failure    Ventilator Sedation RASS 0 to -2      ASSESSMENT AND PLAN SYNOPSIS  ACUTE RESP FAILURE FROM ACUTE NEW CVA WITH SEVERE ENCEPHALOPATHY with PROGRESSIVE MULTIORGAN FAILURE  Severe Hypoxic and Hypercapnic Respiratory Failure -continue Mechanical Ventilator support -continue Bronchodilator Therapy -Wean Fio2 and PEEP as tolerated -VAP/VENT bundle implementation Unable to protect airway   Renal Failure- -follow chem 7 -follow UO -continue Foley Catheter-assess need Worsening kidney function   NEUROLOGY-acute CVA - intubated  No sedation at this time Follow up neurology recs   CARDIAC ICU monitoring   GI/Nutrition GI PROPHYLAXIS as indicated DIET-->start TF's as tolerated Constipation protocol as indicated    ENDO - ICU hypoglycemic\Hyperglycemia protocol -check FSBS per protocol  ELECTROLYTES -follow labs as needed -replace as needed -pharmacy consultation and following    DVT/GI PRX ordered TRANSFUSIONS AS NEEDED MONITOR FSBS ASSESS the need for LABS as needed   AT this time, wea rewaiting for additional family members to arrive to make final decisions on patient His progressive multiorgan failure in setting of ongoing CVA's makes his chance of meaningful recovery very very low!     Critical Care Time devoted to patient care services described in this note is 34 minutes.   Overall, patient is critically ill, prognosis is guarded.  Patient with Multiorgan  failure and at high risk for cardiac arrest and death.   Prognosis is poor, recommend comfort care measures Corrin Parker, M.D.  [facility]  Medical Director [place] Director [facility] Cardio-Pulmonary Department

## 2018-06-16 NOTE — Progress Notes (Signed)
Palliative care:  Consult received and chart reviewed. Discussed with RN and Dr. Mortimer Fries. Spoke with patient's son, Grayland Ormond, and introduced palliative medicine. Discussed need for goals of care conversation that should involve all decision makers. Grayland Ormond arranged meeting with decision makers for tomorrow AM - 2/27 at 10 am.   Thank you for this consult.  Juel Burrow, DNP, AGNP-C Palliative Medicine Team Team Phone # [phone]  Pager # [phone]  NO CHARGE

## 2018-06-16 NOTE — Progress Notes (Signed)
PHARMACY NOTE:  ANTIMICROBIAL RENAL DOSAGE ADJUSTMENT  Current antimicrobial regimen includes a mismatch between antimicrobial dosage and estimated renal function.  As per policy approved by the Pharmacy & Therapeutics and Medical Executive Committees, the antimicrobial dosage will be adjusted accordingly.   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 male admitted on 06/01/2018 with sepsis (unknown source-though nursing noted raw sores on patients scrotal area and back of legs). Patient is getting intermittent HD. Pharmacy has been consulted for meropenem dosing post intermittent HD.  Current antimicrobial dosage:  Meropenem 1g IV q12h  Indication: Sepsis  Renal Function: 20 mL/min  Estimated Creatinine Clearance: 20 mL/min (A) (by C-G formula based on SCr of 3.89 mg/dL (H)). []      On intermittent HD, scheduled: []      On CRRT    Antimicrobial dosage has been changed to:  Meropenem 500 mg IV q12h.  Additional comments: This is the adjusted dose for his CrCl of 20 mL/min.    Thank you for allowing pharmacy to be a part of this patient's care.  Talor Desrosiers [address], [place] 06/16/2018 11:09 AM

## 2018-06-16 NOTE — ED Notes (Addendum)
NIHSS performed at 1759, error in charting with  Time. See 9824 on ED Narrator with NIHSS scale and result

## 2018-06-16 NOTE — Progress Notes (Signed)
Spoke with pt's son regarding placement of trialysis catheter for possible HD. Son stated he wanted to wait until after the family meeting tomorrow to make a decision. Dr. Mortimer Fries aware of same.

## 2018-06-17 DIAGNOSIS — Z7189 Other specified counseling: Secondary | ICD-10-CM

## 2018-06-17 DIAGNOSIS — I639 Cerebral infarction, unspecified: Secondary | ICD-10-CM

## 2018-06-17 DIAGNOSIS — Z515 Encounter for palliative care: Secondary | ICD-10-CM

## 2018-06-17 LAB — URINE CULTURE: Culture: >=100000 — AB

## 2018-06-17 LAB — BASIC METABOLIC PANEL
BUN: 178 mg/dL — ABNORMAL HIGH (ref 8–23)
CO2: 18 mmol/L — ABNORMAL LOW (ref 22–32)
Calcium: 8.7 mg/dL — ABNORMAL LOW (ref 8.9–10.3)
Chloride: >130 mmol/L (ref 98–111)
Creatinine, Ser: 3.74 mg/dL — ABNORMAL HIGH (ref 0.61–1.24)
GFR calc Af Amer: 18 mL/min — ABNORMAL LOW (ref >60)
GFR calc non Af Amer: 15 mL/min — ABNORMAL LOW (ref >60)
Glucose, Bld: 403 mg/dL — ABNORMAL HIGH (ref 70–99)
Potassium: 4.8 mmol/L (ref 3.5–5.1)
Sodium: 165 mmol/L (ref 135–145)

## 2018-06-17 LAB — TROPONIN I
Troponin I: 0.19 ng/mL (ref <0.03)
Troponin I: 0.18 ng/mL (ref <0.03)

## 2018-06-17 LAB — CBC
HCT: 30.7 % — ABNORMAL LOW (ref 39.0–52.0)
Hemoglobin: 9.3 g/dL — ABNORMAL LOW (ref 13.0–17.0)
MCH: 24.7 pg — ABNORMAL LOW (ref 26.0–34.0)
MCHC: 30.3 g/dL (ref 30.0–36.0)
MCV: 81.6 fL (ref 80.0–100.0)
Platelets: 172 10*3/uL (ref 150–400)
RBC: 3.76 MIL/uL — ABNORMAL LOW (ref 4.22–5.81)
RDW: 20.9 % — ABNORMAL HIGH (ref 11.5–15.5)
WBC: 29.2 10*3/uL — ABNORMAL HIGH (ref 4.0–10.5)
nRBC: 0 % (ref 0.0–0.2)

## 2018-06-17 LAB — MAGNESIUM
Magnesium: 3.6 mg/dL — ABNORMAL HIGH (ref 1.7–2.4)
Magnesium: 3.7 mg/dL — ABNORMAL HIGH (ref 1.7–2.4)

## 2018-06-17 LAB — POTASSIUM: Potassium: 5 mmol/L (ref 3.5–5.1)

## 2018-06-17 LAB — GLUCOSE, CAPILLARY
Glucose-Capillary: 297 mg/dL — ABNORMAL HIGH (ref 70–99)
Glucose-Capillary: 199 mg/dL — ABNORMAL HIGH (ref 70–99)

## 2018-06-17 MED ORDER — CALCIUM GLUCONATE-NACL 1-0.675 GM/50ML-% IV SOLN
1.0000 g | Freq: Once | INTRAVENOUS | Status: AC
  Administered 2018-06-17: 1000 mg via INTRAVENOUS
  Filled 2018-06-17: qty 50

## 2018-06-17 NOTE — Progress Notes (Signed)
[place] [facility] [place] NAME: Alex Vance    MR#:  [id number]  DATE OF BIRTH:  1945-09-16  SUBJECTIVE:  CHIEF COMPLAINT:   Chief Complaint  Patient presents with  . unresponsive   Patient is a 73 year old who was admitted on 05/26/2018 after being found unresponsive.  Recently discharged from [facility] after having significant stroke resulting in patient being aphasic.  Patient status post PEG tube placement at [facility] prior to discharge.  On evaluation in the emergency room patient was found to have evidence of CVA with hemorrhagic transformation.  Patient has history of chronic atrial fibrillation and was on Xarelto prior to presentation.  Reversal agents were given in the emergency room.  Patient was admitted to the ICU.     Patient remains intubated in the ICU.  Palliative care team met with family today and they have decided on initiating withdrawal of care on Saturday once all family members available    REVIEW OF SYSTEMS:  ROS Unobtainable due to patient being sedated on the vent   DRUG ALLERGIES:   Allergies  Allergen Reactions  . Penicillins Rash    Has patient had a PCN reaction causing immediate rash, facial/tongue/throat swelling, SOB or lightheadedness with hypotension: Yes Has patient had a PCN reaction causing severe rash involving mucus membranes or skin necrosis: No Has patient had a PCN reaction that required hospitalization No Has patient had a PCN reaction occurring within the last 10 years: No If all of the above answers are "NO", then may proceed with Cephalosporin use.   VITALS:  Blood pressure 118/64, pulse (!) 105, temperature (!) 97.5 F (36.4 C), temperature source Bladder, resp. rate (!) 22, height 6' 1" (1.854 m), weight 83.4 kg, SpO2 100 %. PHYSICAL EXAMINATION:  Physical Exam  Constitutional: He appears well-developed.  Patient on the vent  HENT:  Head: Normocephalic and atraumatic.  Mouth/Throat:  Oropharynx is clear and moist.  Eyes: Pupils are equal, round, and reactive to light. Conjunctivae and EOM are normal. Right eye exhibits no discharge.  Neck: Normal range of motion. Neck supple. No tracheal deviation present.  Cardiovascular: Normal rate, regular rhythm and normal heart sounds.  Respiratory: Effort normal and breath sounds normal. He has no wheezes.  GI: Bowel sounds are normal. There is no abdominal tenderness.  Musculoskeletal:        General: No edema.  Neurological:  Patient on the vent.  Full neuro examination not done at this time  Skin: Skin is warm. He is not diaphoretic. No erythema.   LABORATORY PANEL:  Male CBC Recent Labs  Lab 06/17/18 0538  WBC 29.2*  HGB 9.3*  HCT 30.7*  PLT 172   ------------------------------------------------------------------------------------------------------------------ Chemistries  Recent Labs  Lab 06/16/18 0604  06/17/18 0538  NA 165*  --  165*  K 5.2*   < > 4.8  CL >130*  --  >130*  CO2 20*  --  18*  GLUCOSE 282*  --  403*  BUN 175*  --  178*  CREATININE 3.89*  --  3.74*  CALCIUM 7.8*  --  8.7*  MG  --    < > 3.7*  AST 81*  --   --   ALT 145*  --   --   ALKPHOS 129*  --   --   BILITOT 1.4*  --   --    < > = values in this interval not displayed.   RADIOLOGY:  No results found. ASSESSMENT  AND PLAN:   1.  Acute hypoxic and hypercapnic respiratory failure Secondary to CVA Patient already intubated on the vent.  Being managed by critical care physician.  2. Severe sepsis secondary to UTI Patient started on IV fluids and Levophed drip had to be initiated .  Continue broad-spectrum IV antibiotics with IV aztreonam and vancomycin initially. Patient currently on meropenem. Being managed by critical care team.  3.  Acute kidney injury superimposed on chronic kidney disease stage 3 Follow-up on renal function.  Renal function worsening recently. Family has decided against hemodialysis.  Plans for withdrawal  of care on Saturday  4.  Diabetes mellitus type 2; continue sliding scale insulin coverage.  Monitor blood sugars and adjust regimen  5.  Chronic systolic CHF; stable  6.  Hypertension; fairly controlled  7.  Chronic atrial fibrillation; rate controlled. No anticoagulation due to recent hemorrhagic conversion  8.  Right scrotal hydrocele Seen by urologist.  Low suspicion for intra-scrotal pathology.  To consider repeat CT scan.  Currently unstable as such no intervention planned by urologist.  Felt this is likely chronic.  9.  Recent acute CVA with hemorrhagic conversion Anticoagulation with Xarelto already discontinued.  Reversal agents documented to have been given in the emergency room. Repeat CT scan of the head done recently revealed large subacute partially hemorrhagic left frontal.  Tall temporal lobe infarction with surrounding edema and mild mass-effect upon the left lateral ventricle.  No significant change since yesterday's exam. Being managed by critical care team and neurology service. Palliative care had meeting with family today.  Family has decided on initiating withdrawal of care on Saturday afternoon once all family members arrive.  No escalation of care.  No hemodialysis.  No labs.  CODE STATUS changed to DNR.  With ultimate goal of keeping patient comfortable  10.  Nutrition Patient status post PEG tube placement following CVA recently at [facility]  11.  Hypernatremia Nursing staff about to initiate water to be administered via NG tube at 200 cc daily 6 hourly.  Being managed by critical care team.  DVT prophylaxis; SCDs   All the records are reviewed and case discussed with Care Management/Social Worker. Management plans discussed with the patient, family and they are in agreement.  CODE STATUS: DNR  TOTAL TIME TAKING CARE OF THIS PATIENT: 37 minutes.   More than 50% of the time was spent in counseling/coordination of care: YES  POSSIBLE D/C IN 3 DAYS, DEPENDING  ON CLINICAL CONDITION.   Jude Ojie M.D on 06/17/2018 at 3:55 PM  Between 7am to 6pm - Pager - [phone]  After 6pm go to [email or web address] - Proofreader  [facility]  Office  [phone]  CC: Primary care physician; System, Pcp Not In  Note: This dictation was prepared with Dragon dictation along with smaller phrase technology. Any transcriptional errors that result from this process are unintentional.

## 2018-06-17 NOTE — Progress Notes (Signed)
   06/17/18 1400  Clinical Encounter Type  Visited With Family  Visit Type Follow-up  Referral From Chaplain  Consult/Referral To Chaplain  Spiritual Encounters  Spiritual Needs Emotional  Chaplain talked to sister Barnetta Chapel and Barnetta Chapel shared that the patient's family decided to put patient on palliative care Saturday. Daiva Huge for praying for them. Barnetta Chapel said it was the best thing to do.

## 2018-06-17 NOTE — Progress Notes (Signed)
CRITICAL CARE NOTE  CC  follow up respiratory failure  SUBJECTIVE Patient remains critically ill Prognosis is guarded On  Vent Multiorgan failure    SIGNIFICANT EVENTS   REVIEW OF SYSTEMS  PATIENT IS UNABLE TO PROVIDE COMPLETE REVIEW OF SYSTEMS DUE TO SEVERE CRITICAL ILLNESS   PHYSICAL EXAMINATION:  GENERAL:critically ill appearing, +resp distress HEAD: Normocephalic, atraumatic.  EYES: Pupils equal, round, reactive to light.  No scleral icterus.  MOUTH: Moist mucosal membrane. NECK: Supple. No thyromegaly. No nodules. No JVD.  PULMONARY: +rhonchi, +wheezing CARDIOVASCULAR: S1 and S2. Regular rate and rhythm. No murmurs, rubs, or gallops.  GASTROINTESTINAL: Soft, nontender, -distended. No masses. Positive bowel sounds. No hepatosplenomegaly.  MUSCULOSKELETAL: No swelling, clubbing, or edema.  NEUROLOGIC: obtunded, GCS<8 SKIN:intact,warm,dry    CULTURE RESULTS   Recent Results (from the past 240 hour(s))  Urine culture     Status: Abnormal (Preliminary result)   Collection Time: 06/07/2018  5:41 PM  Result Value Ref Range Status   Specimen Description   Final    URINE, RANDOM Performed at [facility], [address] [place], [place] [address]    Special Requests   Final    NONE Performed at [facility], [address] [place], [place] [address]    Culture >=100,000 COLONIES/mL ESCHERICHIA COLI (A)  Final   Report Status PENDING  Incomplete  Blood Culture (routine x 2)     Status: None (Preliminary result)   Collection Time: 06/16/2018  6:01 PM  Result Value Ref Range Status   Specimen Description BLOOD LEFT ANTECUBITAL  Final   Special Requests   Final    BOTTLES DRAWN AEROBIC AND ANAEROBIC Blood Culture adequate volume   Culture   Final    NO GROWTH 2 DAYS Performed at [facility], [address], [place], [place] [address]    Report Status PENDING  Incomplete  Blood Culture (routine x 2)     Status: None (Preliminary  result)   Collection Time: 06/02/2018  6:01 PM  Result Value Ref Range Status   Specimen Description BLOOD BLOOD RIGHT WRIST  Final   Special Requests   Final    BOTTLES DRAWN AEROBIC AND ANAEROBIC Blood Culture adequate volume   Culture   Final    NO GROWTH 2 DAYS Performed at [facility], [address], [place], [place] [address]    Report Status PENDING  Incomplete  MRSA PCR Screening     Status: None   Collection Time: 06/15/18 12:50 AM  Result Value Ref Range Status   MRSA by PCR NEGATIVE NEGATIVE Final    Comment:        The GeneXpert MRSA Assay (FDA approved for NASAL specimens only), is one component of a comprehensive MRSA colonization surveillance program. It is not intended to diagnose MRSA infection nor to guide or monitor treatment for MRSA infections. Performed at [facility], [address] [place], [place] [address]   Culture, respiratory (non-expectorated)     Status: None (Preliminary result)   Collection Time: 06/16/18  9:34 AM  Result Value Ref Range Status   Specimen Description   Final    TRACHEAL ASPIRATE Performed at [facility], [address] [place], [place] [address]    Special Requests   Final    Normal Performed at [facility], [address] [place], [place] [address]    Gram Stain   Final    MODERATE WBC PRESENT, PREDOMINANTLY PMN NO ORGANISMS SEEN Performed at  [facility], [place] [address] [place], [place] [address]    Culture PENDING  Incomplete   Report Status PENDING  Incomplete          IMAGING    No results found.    Indwelling Urinary Catheter continued, requirement due to   Reason to continue Indwelling Urinary Catheter for strict Intake/Output monitoring for hemodynamic instability         Ventilator continued, requirement due to, resp failure    Ventilator Sedation RASS 0 to -2     ASSESSMENT AND PLAN SYNOPSIS ACUTE RESP FAILURE FROM ACUTE NEW CVA WITH  SEVERE ENCEPHALOPATHY with PROGRESSIVE MULTIORGAN FAILURE  Severe Hypoxic and Hypercapnic Respiratory Failure -continue Full MV support -continue Bronchodilator Therapy -Wean Fio2 and PEEP as tolerated Obtunded unable to wean from vent Patient is dying     Renal Failure- -follow chem 7 -follow UO -continue Foley Catheter-assess need daily Worsening kidney function   NEUROLOGY No sedation GCS<3   CARDIAC ICU monitoring  ID -continue IV abx as prescibed -follow up cultures  GI/Nutrition GI PROPHYLAXIS as indicated DIET-->TF's as tolerated Constipation protocol as indicated  ENDO - ICU hypoglycemic\Hyperglycemia protocol -check FSBS per protocol   ELECTROLYTES -follow labs as needed -replace as needed -pharmacy consultation and following   DVT/GI PRX ordered TRANSFUSIONS AS NEEDED MONITOR FSBS ASSESS the need for LABS as needed   Critical Care Time devoted to patient care services described in this note is 31 minutes.   Overall, patient is critically ill, prognosis is guarded.  Patient with Multiorgan failure and at high risk for cardiac arrest and death.   Recommend comfort care measures Palliative care to meet with family today  AT this time, wea rewaiting for additional family members to arrive to make final decisions on patient His progressive multiorgan failure in setting of ongoing CVA's makes his chance of meaningful recovery very very low!    Corrin Parker, M.D.  [facility]  Medical Director [place] Director [facility] Cardio-Pulmonary Department

## 2018-06-17 NOTE — Care Management Note (Signed)
Case Management Note  Patient Details  Name: Jaelen Soth MRN: [id number] Date of Birth: [birth year]  Subjective/Objective:      Patient admitted with severe sepsis.  Patient is on the ventilator and sedated.  Palliative has met with the family to establish goals of care.  Plan is to withdraw care on Saturday when all of the family can be present, until that time there will be no escalation of care, but continue the current course. Doran Clay RN BSN Care Management   [phone]               Action/Plan:   Expected Discharge Date:                  Expected Discharge Plan:     In-House Referral:     Discharge planning Services     Post Acute Care Choice:    Choice offered to:     DME Arranged:    DME Agency:     HH Arranged:    HH Agency:     Status of Service:  In process, will continue to follow  If discussed at Long Length of Stay Meetings, dates discussed:    Additional Comments:  Shelbie Hutching, RN 06/17/2018, 11:38 AM

## 2018-06-17 NOTE — Progress Notes (Signed)
Subjective: Patient remains intubated.  Remains unresponsive.    Objective: Current vital signs: BP 118/64 (BP Location: Right Arm)   Pulse (!) 105   Temp (!) 97.5 F (36.4 C) (Bladder)   Resp (!) 22   Ht '6\' 1"'$  (1.854 m)   Wt 83.4 kg   SpO2 100%   BMI 24.26 kg/m  Vital signs in last 24 hours: Temp:  [97.2 F (36.2 C)-98.4 F (36.9 C)] 97.5 F (36.4 C) (02/27 0800) Pulse Rate:  [52-113] 105 (02/27 0800) Resp:  [17-23] 22 (02/27 0800) BP: (111-151)/(48-79) 118/64 (02/27 0800) SpO2:  [99 %-100 %] 100 % (02/27 0808) FiO2 (%):  [28 %-30 %] 28 % (02/27 0808) Weight:  [83.4 kg] 83.4 kg (02/27 0319)  Intake/Output from previous day: 02/26 0701 - 02/27 0700 In: 479.8 [I.V.:261.4; IV Piggyback:218.4] Out: 1450 [Urine:1350; Emesis/NG output:100] Intake/Output this shift: No intake/output data recorded. Nutritional status:  Diet Order            Diet NPO time specified  Diet effective now              Neurologic Exam: Mental Status: Patient does not respond to verbal stimuli.  No response to deep sternal rub.  Does not follow commands.  No verbalizations noted.  Cranial Nerves: II: Patient does not respond confrontation bilaterally, pupils right 4 mm, left 4 mm,and reactive bilaterally III,IV,VI: doll's response absent bilaterally. V,VII: corneal reflex reduced on the right VIII: patient does not respond to verbal stimuli IX,X: gag reflex present, XI: trapezius strength unable to test bilaterally XII: tongue strength unable to test Motor: Patient flaccid throughout   Lab Results: Basic Metabolic Panel: Recent Labs  Lab 06/05/2018 1801 06/15/18 0550 06/16/18 0604 06/17/18 0056 06/17/18 0538  NA 159* 159* 165*  --  165*  K 5.7* 4.9 5.2* 5.0 4.8  CL 128* 130* >130*  --  >130*  CO2 23 21* 20*  --  18*  GLUCOSE 482* 412* 282*  --  403*  BUN 162* 162* 175*  --  178*  CREATININE 3.47* 3.26* 3.89*  --  3.74*  CALCIUM 8.0* 7.4* 7.8*  --  8.7*  MG  --  3.3*  --  3.6*  3.7*    Liver Function Tests: Recent Labs  Lab 05/29/2018 1801 06/16/18 0604  AST 194* 81*  ALT 260* 145*  ALKPHOS 156* 129*  BILITOT 0.4 1.4*  PROT 6.2* 6.0*  ALBUMIN 2.4* 2.3*   No results for input(s): LIPASE, AMYLASE in the last 168 hours. Recent Labs  Lab 06/16/18 0604  AMMONIA 13    CBC: Recent Labs  Lab 06/18/2018 1801 06/15/18 0550 06/16/18 0604 06/17/18 0538  WBC 23.5* 26.3* 33.1* 29.2*  NEUTROABS 19.8*  --   --   --   HGB 12.2* 10.9* 10.1* 9.3*  HCT 40.4 37.0* 34.4* 30.7*  MCV 82.4 84.3 84.7 81.6  PLT 252 192 184 172    Cardiac Enzymes: Recent Labs  Lab 06/17/18 0056 06/17/18 0538  TROPONINI 0.19* 0.18*    Lipid Panel: No results for input(s): CHOL, TRIG, HDL, CHOLHDL, VLDL, LDLCALC in the last 168 hours.  CBG: Recent Labs  Lab 06/16/18 0347 06/16/18 0731 06/16/18 1128 06/16/18 1533 06/17/18 0351  GLUCAP 244* 238* 283* 290* 297*    Microbiology: Results for orders placed or performed during the hospital encounter of 06/04/2018  Urine culture     Status: Abnormal   Collection Time: 05/24/2018  5:41 PM  Result Value Ref Range Status  Specimen Description   Final    URINE, RANDOM Performed at [facility], [address] [place], [place] [address]    Special Requests   Final    NONE Performed at [facility], [address] [place], [place] [address]    Culture (A)  Final    >=100,000 COLONIES/mL ESCHERICHIA COLI Confirmed Extended Spectrum Beta-Lactamase Producer (ESBL).  In bloodstream infections from ESBL organisms, carbapenems are preferred over piperacillin/tazobactam. They are shown to have a lower risk of mortality.    Report Status 06/17/2018 FINAL  Final   Organism ID, Bacteria ESCHERICHIA COLI (A)  Final      Susceptibility   Escherichia coli - MIC*    AMPICILLIN >=32 RESISTANT Resistant     CEFAZOLIN >=64 RESISTANT Resistant     CEFTRIAXONE >=64 RESISTANT Resistant     CIPROFLOXACIN >=4 RESISTANT  Resistant     GENTAMICIN <=1 SENSITIVE Sensitive     IMIPENEM <=0.25 SENSITIVE Sensitive     NITROFURANTOIN <=16 SENSITIVE Sensitive     TRIMETH/SULFA >=320 RESISTANT Resistant     AMPICILLIN/SULBACTAM >=32 RESISTANT Resistant     PIP/TAZO 16 SENSITIVE Sensitive     Extended ESBL POSITIVE Resistant     * >=100,000 COLONIES/mL ESCHERICHIA COLI  Blood Culture (routine x 2)     Status: None (Preliminary result)   Collection Time: 06/13/2018  6:01 PM  Result Value Ref Range Status   Specimen Description BLOOD LEFT ANTECUBITAL  Final   Special Requests   Final    BOTTLES DRAWN AEROBIC AND ANAEROBIC Blood Culture adequate volume   Culture   Final    NO GROWTH 3 DAYS Performed at [facility], [address] [place], [place] [address]    Report Status PENDING  Incomplete  Blood Culture (routine x 2)     Status: None (Preliminary result)   Collection Time: 05/22/2018  6:01 PM  Result Value Ref Range Status   Specimen Description BLOOD BLOOD RIGHT WRIST  Final   Special Requests   Final    BOTTLES DRAWN AEROBIC AND ANAEROBIC Blood Culture adequate volume   Culture   Final    NO GROWTH 3 DAYS Performed at [facility], [address], [place], [place] [address]    Report Status PENDING  Incomplete  MRSA PCR Screening     Status: None   Collection Time: 06/15/18 12:50 AM  Result Value Ref Range Status   MRSA by PCR NEGATIVE NEGATIVE Final    Comment:        The GeneXpert MRSA Assay (FDA approved for NASAL specimens only), is one component of a comprehensive MRSA colonization surveillance program. It is not intended to diagnose MRSA infection nor to guide or monitor treatment for MRSA infections. Performed at [facility], [address] [place], [place] [address]   Culture, respiratory (non-expectorated)     Status: None (Preliminary result)   Collection Time: 06/16/18  9:34 AM  Result Value Ref Range Status   Specimen Description   Final     TRACHEAL ASPIRATE Performed at [facility], [address] [place], [place] [address]    Special Requests   Final    Normal Performed at [facility], [address] [place], [place] [address]    Gram Stain   Final    MODERATE WBC PRESENT, PREDOMINANTLY PMN NO ORGANISMS SEEN Performed at [facility], [place] [address], [place], [place] [address]    Culture PENDING  Incomplete   Report Status PENDING  Incomplete    Coagulation Studies: Recent Labs    06/15/2018 1900 06/16/18 1002  LABPROT 16.4* 16.6*  INR 1.3 1.4*    Imaging: Ct Head Wo Contrast  Result Date: 06/15/2018 CLINICAL DATA:  73 year old male. Follow-up CVA. Subsequent encounter. EXAM: CT HEAD WITHOUT CONTRAST TECHNIQUE: Contiguous axial images were obtained from the base of the skull through the vertex without intravenous contrast. COMPARISON:  06/08/2018. FINDINGS: Brain: Large subacute partially hemorrhagic left frontal-parietal-temporal lobe infarct with surrounding edema and mild mass-effect upon the left lateral ventricle (with minimal bowing of the septum towards the right) without significant change since yesterday's exam. Remote moderate size left parietal-occipital lobe infarct with encephalomalacia. Prominent chronic microvascular changes. Global atrophy. No intracranial mass lesion noted on this unenhanced exam. Vascular: Prominent vertebral artery and carotid artery cavernous segment calcifications. Skull: No acute abnormality. Sinuses/Orbits: No acute orbital abnormality. Mild mucosal thickening ethmoid sinus air cells and maxillary sinuses. Minimal mucosal thickening anterior sphenoid sinus air cells. Mastoid air cells and middle ear cavities are clear. Other: Prominent scalp vascular calcifications. IMPRESSION: 1. Large subacute partially hemorrhagic left frontal-parietal-temporal lobe infarct with surrounding edema and mild mass-effect upon the left lateral ventricle (with minimal bowing  of the septum towards the right) without significant change since yesterday's exam. 2. Remote moderate size left parietal-occipital lobe infarct with encephalomalacia. 3. Prominent chronic microvascular changes. 4. Global atrophy. 5. Prominent vertebral artery and carotid artery cavernous segment calcifications. 6. Mild mucosal thickening ethmoid sinus air cells and maxillary sinuses. Minimal mucosal thickening anterior sphenoid sinus air cells. Electronically Signed   By: Genia Del M.D.   On: 06/15/2018 14:46   Dg Chest Port 1 View  Result Date: 06/16/2018 CLINICAL DATA:  Acute respiratory failure EXAM: PORTABLE CHEST 1 VIEW COMPARISON:  06/15/2018 FINDINGS: Cardiac shadow is stable. Defibrillator is again noted in satisfactory position. Endotracheal tube and gastric catheter are noted in satisfactory position. The lungs are well aerated bilaterally. No focal infiltrate or sizable effusion is seen. No acute bony abnormality is noted. IMPRESSION: Tubes and lines as described above.  No infiltrate is seen. Electronically Signed   By: Inez Catalina M.D.   On: 06/16/2018 07:35    Medications:  I have reviewed the patient's current medications. Scheduled: . chlorhexidine gluconate (MEDLINE KIT)  15 mL Mouth Rinse BID  . mouth rinse  15 mL Mouth Rinse 10 times per day    Assessment/Plan: Patient remains unresponsive.  Family considering comfort care.  Will continue to follow with you as needed.     LOS: 3 days   Alexis Goodell, MD Neurology [phone] 06/17/2018  11:04 AM

## 2018-06-17 NOTE — Consult Note (Signed)
Consultation Note Date: 06/17/2018   Patient Name: Alex Vance  DOB: 1945/08/31  MRN: [id number]  Age / Sex: 73 y.o., male  PCP: System, Pcp Not In Referring Physician: Otila Back, MD  Reason for Consultation: Establishing goals of care, Psychosocial/spiritual support, Terminal Care and Withdrawal of life-sustaining treatment  HPI/Patient Profile: 73 y.o. male  with past medical history of a fib on xarelto, T2DM, HLD, HTN, and nonischemic cardiomyopathy s/p ICD placement in 2008 admitted on 06/07/2018 unresponsive. He was recently discharged from a long hospital stay at [facility] after an ischemic stroke in Jan. Post stroke he was aphasic and nonambulatory. He was sent to [facility] for rehab. He had a PEG tube placed prior to discharge from [facility]. On admission to ED, patient was found to have a new stroke with early hemorrhagic transformation. He required intubation d/t AMS. Also found to have UTI and severe sepsis. Patient has been hypotensive and requiring IV vasopressors. Labs have revealed renal failure and shock liver. Patient has been unable to wean from vent. Patient is unresponsive and on no sedation. PMT consulted for [place].  Clinical Assessment and Goals of Care: I have reviewed medical records including EPIC notes, labs and imaging, received report from RN and Dr. Mortimer Fries, assessed the patient and then met with patient's family  to discuss diagnosis prognosis, [place], EOL wishes, disposition and options.  Patient unable to participate in conversation as he is unresponsive on the ventilator. I met with patient's son, Grayland Ormond, and patient's sister, Lenell Antu, in person. They had patient's daughter, Vito Backers, and patient's other sister, Juliann Pulse, on speaker phone to participate in conversation.  I introduced Palliative Medicine as specialized medical care for people living with serious illness. It focuses on providing relief from the symptoms and stress of  a serious illness. The goal is to improve quality of life for both the patient and the family.  We discussed a brief life review of the patient. They all describe patient as very friendly and a "jokester". Patient is a retired Biomedical scientist. Spending time with his family and going to church has been very important to him.    As far as functional and nutritional status, they tell me of patient's decline since his stroke. He was starting to work with therapy and gain some function; however, several days prior to admission patient had become less interactive. They tell me prior to stroke patient lived alone and independently cared for himself. It is difficult for family to accept his condition now since he was so independent.    We discussed his current illness and what it means in the larger context of his on-going co-morbidities.  Natural disease trajectory and expectations at EOL were discussed. We discussed Mr. Mcfarlan's brain damage, his multiorgan failure, and his dependence of mechanical ventilation.   I attempted to elicit values and goals of care important to the patient.  Family all agree that Mr. Okimoto would find this quality of life unacceptable and would not want life prolonged artificially.   The difference between aggressive medical intervention and comfort care was considered in light of the patient's goals of care. We discussed what continued aggressive care entailed - continuing ventilation, trach, hemodialysis - and then we discussed what focusing on his comfort entailed. We discussed the process of liberation from mechanical ventilation. Family agrees they would like Mr. Cumpston's comfort to be the focus of his care.   Family agrees to withdrawal of care Saturday afternoon when the family can all be together. Family prepared  that patient may pass quickly following liberation from ventilator. Until then, they agree to continue current care including ventilator and vasopressors. They do not  want care escalated - no HD, no labs. They agree to DNR status and understand that if Mr. Tay experiences cardiac arrest before withdrawal of care Saturday he will be allowed to pass naturally.   Dr. Mortimer Fries joined meeting and discussed the above with family as well. All in agreement with plan.   Questions and concerns were addressed. The family was encouraged to call with questions or concerns.   Primary Decision Maker NEXT OF KIN - patient's children Grayland Ormond and Cassandra  SUMMARY OF RECOMMENDATIONS   - withdrawal of care Saturday afternoon by critical care once family arrives - No escalation of care - no HD, no labs - code status changed to DNR - ultimate goal of care is comfort and avoid suffering  Code Status/Advance Care Planning:  DNR   Symptom Management:   None needed -patient unresponsive - suggest push of benzo and dilaudid prior to extubation to ensure comfort  Palliative Prophylaxis:   Frequent Pain Assessment, Oral Care and Turn Reposition  Additional Recommendations (Limitations, Scope, Preferences):  Minimize Medications, No Diagnostics, No Glucose Monitoring, No Hemodialysis, No Lab Draws and No Tracheostomy  Prognosis:   < 2 weeks  Discharge Planning: Anticipated Hospital Death      Primary Diagnoses: Present on Admission: . Severe sepsis ([place]) . Chronic systolic CHF (congestive heart failure) ([place]) . HTN (hypertension) . Type II diabetes mellitus with complication ([place]) . Acute lower UTI . Acute on chronic kidney failure ([place]) . Chronic atrial fibrillation   I have reviewed the medical record, interviewed the patient and family, and examined the patient. The following aspects are pertinent.  Past Medical History:  Diagnosis Date  . Chronic atrial fibrillation    a. on Xarelto  . Diabetes mellitus without complication ([place])   . Hyperlipemia   . Hypertension   . Nonischemic cardiomyopathy [facility])    a. s/p [employer] ICD placement  2008, generator change 11/2013  b. Echo 2015: EF 30-35%   Social History   Socioeconomic History  . Marital status: Single    Spouse name: Not on file  . Number of children: Not on file  . Years of education: Not on file  . Highest education level: Not on file  Occupational History  . Not on file  Social Needs  . Financial resource strain: Not on file  . Food insecurity:    Worry: Not on file    Inability: Not on file  . Transportation needs:    Medical: Not on file    Non-medical: Not on file  Tobacco Use  . Smoking status: Former Research scientist (life sciences)  . Smokeless tobacco: Never Used  Substance and Sexual Activity  . Alcohol use: No  . Drug use: No  . Sexual activity: Not on file  Lifestyle  . Physical activity:    Days per week: Not on file    Minutes per session: Not on file  . Stress: Not on file  Relationships  . Social connections:    Talks on phone: Not on file    Gets together: Not on file    Attends religious service: Not on file    Active member of club or organization: Not on file    Attends meetings of clubs or organizations: Not on file    Relationship status: Not on file  Other Topics Concern  . Not on file  Social History Narrative  . Not on file   Family History  Problem Relation Age of Onset  . Diabetes Father    Scheduled Meds: . chlorhexidine gluconate (MEDLINE KIT)  15 mL Mouth Rinse BID  . mouth rinse  15 mL Mouth Rinse 10 times per day   Continuous Infusions: . sodium chloride Stopped (06/16/18 1242)  . famotidine (PEPCID) IV 20 mg (06/16/18 1205)  . meropenem (MERREM) IV Stopped (06/16/18 2200)  . norepinephrine (LEVOPHED) Adult infusion Stopped (06/16/18 2256)   PRN Meds:.sodium chloride, acetaminophen, fentaNYL (SUBLIMAZE) injection, fentaNYL (SUBLIMAZE) injection, midazolam, midazolam, ondansetron (ZOFRAN) IV Allergies  Allergen Reactions  . Penicillins Rash    Has patient had a PCN reaction causing immediate rash, facial/tongue/throat  swelling, SOB or lightheadedness with hypotension: Yes Has patient had a PCN reaction causing severe rash involving mucus membranes or skin necrosis: No Has patient had a PCN reaction that required hospitalization No Has patient had a PCN reaction occurring within the last 10 years: No If all of the above answers are "NO", then may proceed with Cephalosporin use.   Review of Systems  Unable to perform ROS: Intubated    Physical Exam Constitutional:      Interventions: He is intubated.  Cardiovascular:     Rate and Rhythm: Normal rate. Rhythm irregular.  Pulmonary:     Effort: He is intubated.     Breath sounds: Normal breath sounds.  Abdominal:     Palpations: Abdomen is soft.  Musculoskeletal:     Right lower leg: No edema.     Left lower leg: No edema.  Skin:    General: Skin is warm and dry.  Neurological:     Mental Status: He is unresponsive.     Vital Signs: BP 118/64 (BP Location: Right Arm)   Pulse (!) 105   Temp (!) 97.5 F (36.4 C) (Bladder)   Resp (!) 22   Ht '6\' 1"'$  (1.854 m)   Wt 83.4 kg   SpO2 100%   BMI 24.26 kg/m  Pain Scale: CPOT       SpO2: SpO2: 100 % O2 Device:SpO2: 100 % O2 Flow Rate: .   IO: Intake/output summary:   Intake/Output Summary (Last 24 hours) at 06/17/2018 1124 Last data filed at 06/17/2018 0800 Gross per 24 hour  Intake 388.55 ml  Output 1325 ml  Net -936.45 ml    LBM: Last BM Date: 06/16/18 Baseline Weight: Weight: 90.7 kg Most recent weight: Weight: 83.4 kg     Palliative Assessment/Data: PPS 10%    Time In: 1000 Time Out: 1200 Time Total: 120 minutes Greater than 50%  of this time was spent counseling and coordinating care related to the above assessment and plan.  Juel Burrow, DNP, AGNP-C Palliative Medicine Team [phone] Pager: [phone]

## 2018-06-18 LAB — CULTURE, RESPIRATORY W GRAM STAIN
Culture: NORMAL
Special Requests: NORMAL

## 2018-06-18 LAB — CULTURE, RESPIRATORY

## 2018-06-18 NOTE — Progress Notes (Signed)
CRITICAL CARE NOTE  CC  follow up respiratory failure  SUBJECTIVE Patient remains critically ill Prognosis is guarded On vent  multiorgan failure  Very poor porgnosis  Comfort care in next 24 hrs      REVIEW OF SYSTEMS  PATIENT IS UNABLE TO PROVIDE COMPLETE REVIEW OF SYSTEMS DUE TO SEVERE CRITICAL ILLNESS   PHYSICAL EXAMINATION:  GENERAL:critically ill appearing, +resp distress HEAD: Normocephalic, atraumatic.  EYES: Pupils equal, round, reactive to light.  No scleral icterus.  MOUTH: Moist mucosal membrane. NECK: Supple. No thyromegaly. No nodules. No JVD.  PULMONARY: +rhonchi, +wheezing CARDIOVASCULAR: S1 and S2. Regular rate and rhythm. No murmurs, rubs, or gallops.  GASTROINTESTINAL: Soft, nontender, -distended. No masses. Positive bowel sounds. No hepatosplenomegaly.  MUSCULOSKELETAL: No swelling, clubbing, or edema.  NEUROLOGIC: obtunded, GCS<8 SKIN:intact,warm,dry    CULTURE RESULTS   Recent Results (from the past 240 hour(s))  Urine culture     Status: Abnormal   Collection Time: 06/03/2018  5:41 PM  Result Value Ref Range Status   Specimen Description   Final    URINE, RANDOM Performed at [facility], [address], [place], [place] [address]    Special Requests   Final    NONE Performed at [facility], [address] [place], [place] [address]    Culture (A)  Final    >=100,000 COLONIES/mL ESCHERICHIA COLI Confirmed Extended Spectrum Beta-Lactamase Producer (ESBL).  In bloodstream infections from ESBL organisms, carbapenems are preferred over piperacillin/tazobactam. They are shown to have a lower risk of mortality.    Report Status 06/17/2018 FINAL  Final   Organism ID, Bacteria ESCHERICHIA COLI (A)  Final      Susceptibility   Escherichia coli - MIC*    AMPICILLIN >=32 RESISTANT Resistant     CEFAZOLIN >=64 RESISTANT Resistant     CEFTRIAXONE >=64 RESISTANT Resistant     CIPROFLOXACIN >=4 RESISTANT Resistant      GENTAMICIN <=1 SENSITIVE Sensitive     IMIPENEM <=0.25 SENSITIVE Sensitive     NITROFURANTOIN <=16 SENSITIVE Sensitive     TRIMETH/SULFA >=320 RESISTANT Resistant     AMPICILLIN/SULBACTAM >=32 RESISTANT Resistant     PIP/TAZO 16 SENSITIVE Sensitive     Extended ESBL POSITIVE Resistant     * >=100,000 COLONIES/mL ESCHERICHIA COLI  Blood Culture (routine x 2)     Status: None (Preliminary result)   Collection Time: 05/25/2018  6:01 PM  Result Value Ref Range Status   Specimen Description BLOOD LEFT ANTECUBITAL  Final   Special Requests   Final    BOTTLES DRAWN AEROBIC AND ANAEROBIC Blood Culture adequate volume   Culture   Final    NO GROWTH 4 DAYS Performed at [facility], [address], [place], [place] [address]    Report Status PENDING  Incomplete  Blood Culture (routine x 2)     Status: None (Preliminary result)   Collection Time: 06/03/2018  6:01 PM  Result Value Ref Range Status   Specimen Description BLOOD BLOOD RIGHT WRIST  Final   Special Requests   Final    BOTTLES DRAWN AEROBIC AND ANAEROBIC Blood Culture adequate volume   Culture   Final    NO GROWTH 4 DAYS Performed at [facility], [address], [place],  [address]    Report Status PENDING  Incomplete  MRSA PCR Screening     Status: None   Collection Time: 06/15/18 12:50 AM  Result Value Ref Range Status   MRSA by PCR NEGATIVE  NEGATIVE Final    Comment:        The GeneXpert MRSA Assay (FDA approved for NASAL specimens only), is one component of a comprehensive MRSA colonization surveillance program. It is not intended to diagnose MRSA infection nor to guide or monitor treatment for MRSA infections. Performed at [facility], [address] [place], [place] [address]   Culture, respiratory (non-expectorated)     Status: None (Preliminary result)   Collection Time: 06/16/18  9:34 AM  Result Value Ref Range Status   Specimen Description   Final    TRACHEAL  ASPIRATE Performed at [facility], [address] [place], [place] [address]    Special Requests   Final    Normal Performed at [facility], [address] [place], [place] [address]    Gram Stain   Final    MODERATE WBC PRESENT, PREDOMINANTLY PMN NO ORGANISMS SEEN    Culture   Final    CULTURE REINCUBATED FOR BETTER GROWTH Performed at [facility], [place] [address] [place], [place] [address]    Report Status PENDING  Incomplete          IMAGING      ASSESSMENT AND PLAN SYNOPSIS  ACUTE RESP FAILURE FROM ACUTE NEW CVA WITH SEVERE ENCEPHALOPATHYwith PROGRESSIVE MULTIORGAN FAILURE    Severe Hypoxic and Hypercapnic Respiratory Failure -continue Full MV support -continue Bronchodilator Therapy -Wean Fio2 and PEEP as tolerated  clinical status relayed to family  Updated and notified of patients medical condition-  Progressive multiorgan failure with very low chance of meaningful recovery.  Patient is in dying  Process.  Family understands the situation.  They have consented and agreed to DNR/DNI and would like to proceed with Comfort care measures on Saturday   Renal Failure-most likely due to ATN -follow chem 7 -follow UO -continue Foley Catheter-assess need daily   NEUROLOGY GCS<3 Poor prognosis    CARDIAC ICU monitoring  ID -continue IV abx as prescibed -follow up cultures   ENDO - ICU hypoglycemic\Hyperglycemia protocol -check FSBS per protocol   ELECTROLYTES -follow labs as needed -replace as needed -pharmacy consultation and following   DVT/GI PRX ordered TRANSFUSIONS AS NEEDED MONITOR FSBS ASSESS the need for LABS as needed   His progressive multiorgan failure in setting of ongoing CVA's makes his chance of meaningful recovery very very low!   Critical Care Time devoted to patient care services described in this note is 32 minutes.   Overall, patient is critically ill, prognosis is guarded.   Patient with Multiorgan failure and at high risk for cardiac arrest and death.    Corrin Parker, M.D.  [facility]  Medical Director [place] Director [facility] Cardio-Pulmonary Department

## 2018-06-18 NOTE — Progress Notes (Signed)
Daily Progress Note   Patient Name: Alex Vance       Date: 06/18/2018 DOB: [birth year]  Age: 73 y.o. MRN#: [id number] Attending Physician: Otila Back, MD Primary Care Physician: System, Pcp Not In Admit Date: 06/16/2018  Reason for Consultation/Follow-up: Psychosocial/spiritual support, Terminal Care and Withdrawal of life-sustaining treatment  Subjective: Patient remains unresponsive. No family at bedside.   Length of Stay: 4  Current Medications: Scheduled Meds:  . chlorhexidine gluconate (MEDLINE KIT)  15 mL Mouth Rinse BID  . mouth rinse  15 mL Mouth Rinse 10 times per day    Continuous Infusions: . sodium chloride Stopped (06/16/18 1242)  . famotidine (PEPCID) IV 20 mg (06/18/18 1020)  . meropenem (MERREM) IV Stopped (06/18/18 0912)  . norepinephrine (LEVOPHED) Adult infusion Stopped (06/16/18 2256)    PRN Meds: sodium chloride, acetaminophen, fentaNYL (SUBLIMAZE) injection, fentaNYL (SUBLIMAZE) injection, midazolam, midazolam, ondansetron (ZOFRAN) IV  Physical Exam Constitutional:      General: He is not in acute distress.    Interventions: He is intubated.  HENT:     Head: Normocephalic and atraumatic.  Cardiovascular:     Rate and Rhythm: Tachycardia present. Rhythm irregular.  Pulmonary:     Effort: He is intubated.  Skin:    General: Skin is warm and dry.  Neurological:     Mental Status: He is unresponsive.             Vital Signs: BP 139/71   Pulse (!) 104   Temp 98.6 F (37 C)   Resp (!) 22   Ht _0  (1.854 m)   Wt 85.7 kg   SpO2 100%   BMI 24.93 kg/m  SpO2: SpO2: 100 % O2 Device: O2 Device: Ventilator O2 Flow Rate:    Intake/output summary:   Intake/Output Summary (Last 24 hours) at 06/18/2018 1410 Last data filed at 06/18/2018 1300 Gross per 24  hour  Intake 685.85 ml  Output 300 ml  Net 385.85 ml   LBM: Last BM Date: 06/17/18 Baseline Weight: Weight: 90.7 kg Most recent weight: Weight: 85.7 kg       Palliative Assessment/Data: PPS 10%    Flowsheet Rows     Most Recent Value  Intake Tab  Referral Department  Critical care  Unit at Time of Referral  ICU  Palliative Care Primary Diagnosis  Neurology  Date Notified  06/16/18  Palliative Care Type  New Palliative care  Reason for referral  Clarify Goals of Care  Date of Admission  06/15/18  Date first seen by Palliative Care  06/16/18  # of days Palliative referral response time  0 Day(s)  # of days IP prior to Palliative referral  1  Clinical Assessment  Palliative Performance Scale Score  10%  Psychosocial & Spiritual Assessment  Palliative Care Outcomes  Patient/Family meeting held?  Yes  Who was at the meeting?  son and sister  Palliative Care Outcomes  Improved pain interventions, Clarified goals of care, Provided end of life care assistance, Changed to focus on comfort, Provided psychosocial or spiritual support      Patient Active Problem List   Diagnosis Date Noted  . Cerebrovascular accident (CVA) ([place])   . Palliative care by  specialist   . Goals of care, counseling/discussion   . Pressure injury of skin 06/16/2018  . Severe sepsis ([place]) 06/06/2018  . Acute lower UTI 05/29/2018  . Acute on chronic kidney failure ([place]) 06/11/2018  . Protein-calorie malnutrition, severe 11/20/2015  . CHF (congestive heart failure) ([place])   . Postoperative ileus ([place])   . 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 Dilated cardiomyopathy ([place]) 11/11/2015  . Type II diabetes mellitus with complication ([place]) 37/36/6815  . Chronic systolic CHF (congestive heart failure) ([place]) 11/11/2015  . HTN (hypertension) 11/11/2015  . Chronic atrial fibrillation 11/11/2015    Palliative Care Assessment & Plan   HPI: 73 y.o. male  with  past medical history of a fib on xarelto, T2DM, HLD, HTN, and nonischemic cardiomyopathy s/p ICD placement in 2008 admitted on 06/07/2018 unresponsive. He was recently discharged from a long hospital stay at [facility] after an ischemic stroke in Jan. Post stroke he was aphasic and nonambulatory. He was sent to [facility] for rehab. He had a PEG tube placed prior to discharge from [facility]. On admission to ED, patient was found to have a new stroke with early hemorrhagic transformation. He required intubation d/t AMS. Also found to have UTI and severe sepsis. Patient has been hypotensive and requiring IV vasopressors. Labs have revealed renal failure and shock liver. Patient has been unable to wean from vent. Patient is unresponsive and on no sedation. PMT consulted for [place].  Assessment: Follow up today. Patient remains unresponsive, no distress.  Called and spoke with Alex Vance, son. Provided emotional support. Plans remain for withdrawal of care tomorrow, 2/29, afternoon once all of the family arrives.  Discussed with Alex Vance that patient has an ICD - discussed deactivating it so that patient would not receive shocks if he experienced lethal rhythm. Alex Vance agrees to this after discussion.   Spoke with patient's Alex Vance, [employer]. They are coming to deactivate. Order placed for ICD deactivation.   Recommendations/Plan:  Comfort care, liberation from ventilator tomorrow 2/29 once patient's family arrives in the afternoon  No escalation of care  Continue pressors and ventilator until then  DNR  ICD representative coming 2/28 to deactivate ICD  Goals of Care and Additional Recommendations:  Limitations on Scope of Treatment: Minimize Medications, No Diagnostics, No Glucose Monitoring, No Hemodialysis, No Lab Draws and No Tracheostomy  Code Status:  DNR  Prognosis:   Hours - Days  Discharge Planning:  Anticipated Hospital Death  Care plan was discussed with RN and Dr.  Mortimer Fries  Thank you for allowing the Palliative Medicine Team to assist in the care of this patient.   Total Time 35 minutes Prolonged Time Billed  no       Greater than 50%  of this time was spent counseling and coordinating care related to the above assessment and plan.  Alex Burrow, DNP, [facility] Palliative Medicine Team Team Phone # [phone]  Pager [phone]

## 2018-06-18 NOTE — Plan of Care (Signed)

## 2018-06-18 NOTE — Progress Notes (Signed)
[place] [facility] [place] NAME: Alex Vance    MR#:  [id number]  DATE OF BIRTH:  01-11-1946  SUBJECTIVE:  CHIEF COMPLAINT:   Chief Complaint  Patient presents with  . unresponsive   Patient is a 73 year old who was admitted on 06/15/2018 after being found unresponsive.  Recently discharged from [facility] after having significant stroke resulting in patient being aphasic.  Patient status post PEG tube placement at [facility] prior to discharge.  On evaluation in the emergency room patient was found to have evidence of CVA with hemorrhagic transformation.  Patient has history of chronic atrial fibrillation and was on Xarelto prior to presentation.  Reversal agents were given in the emergency room.  Patient was admitted to the ICU.     Patient remains intubated in the ICU.  Palliative care team met with family today and they have decided on initiating withdrawal of care on Saturday once all family members available    REVIEW OF SYSTEMS:  ROS Unobtainable due to patient being sedated on the vent   DRUG ALLERGIES:   Allergies  Allergen Reactions  . Penicillins Rash    Has patient had a PCN reaction causing immediate rash, facial/tongue/throat swelling, SOB or lightheadedness with hypotension: Yes Has patient had a PCN reaction causing severe rash involving mucus membranes or skin necrosis: No Has patient had a PCN reaction that required hospitalization No Has patient had a PCN reaction occurring within the last 10 years: No If all of the above answers are "NO", then may proceed with Cephalosporin use.   VITALS:  Blood pressure (!) 148/88, pulse 76, temperature 98.8 F (37.1 C), resp. rate (!) 21, height '6\' 1"'$  (1.854 m), weight 85.7 kg, SpO2 100 %. PHYSICAL EXAMINATION:  Physical Exam  Constitutional: He appears well-developed.  Patient on the vent  HENT:  Head: Normocephalic and atraumatic.  Mouth/Throat: Oropharynx is clear and moist.  Eyes:  Pupils are equal, round, and reactive to light. Conjunctivae and EOM are normal. Right eye exhibits no discharge.  Neck: Normal range of motion. Neck supple. No tracheal deviation present.  Cardiovascular: Normal rate, regular rhythm and normal heart sounds.  Respiratory: Effort normal and breath sounds normal. He has no wheezes.  GI: Bowel sounds are normal. There is no abdominal tenderness.  Musculoskeletal:        General: No edema.  Neurological:  Patient on the vent.  Full neuro examination not done at this time  Skin: Skin is warm. He is not diaphoretic. No erythema.   LABORATORY PANEL:  Male CBC Recent Labs  Lab 06/17/18 0538  WBC 29.2*  HGB 9.3*  HCT 30.7*  PLT 172   ------------------------------------------------------------------------------------------------------------------ Chemistries  Recent Labs  Lab 06/16/18 0604  06/17/18 0538  NA 165*  --  165*  K 5.2*   < > 4.8  CL >130*  --  >130*  CO2 20*  --  18*  GLUCOSE 282*  --  403*  BUN 175*  --  178*  CREATININE 3.89*  --  3.74*  CALCIUM 7.8*  --  8.7*  MG  --    < > 3.7*  AST 81*  --   --   ALT 145*  --   --   ALKPHOS 129*  --   --   BILITOT 1.4*  --   --    < > = values in this interval not displayed.   RADIOLOGY:  No results found. ASSESSMENT AND PLAN:  1.  Acute hypoxic and hypercapnic respiratory failure Secondary to CVA Patient already intubated on the vent.  Being managed by critical care physician.  2. Severe sepsis secondary to UTI Patient started on IV fluids and Levophed drip had to be initiated .  Patient was previously on broad-spectrum IV antibiotics with IV aztreonam and vancomycin initially. Patient currently on meropenem. Being managed by critical care team.  3.  Acute kidney injury superimposed on chronic kidney disease stage 3 Renal function worsening recently.  No further lab draws performed. Family has decided against hemodialysis.  Plans for withdrawal of care on  Saturday  4.  Diabetes mellitus type 2; continue sliding scale insulin coverage.  Monitor blood sugars and adjust regimen  5.  Chronic systolic CHF; stable  6.  Hypertension; fairly controlled  7.  Chronic atrial fibrillation; rate controlled. No anticoagulation due to recent hemorrhagic conversion  8.  Right scrotal hydrocele Seen by urologist.  Low suspicion for intra-scrotal pathology.  To consider repeat CT scan.  Currently unstable as such no intervention planned by urologist.  Felt this is likely chronic.  9.  Recent acute CVA with hemorrhagic conversion Anticoagulation with Xarelto already discontinued.  Reversal agents documented to have been given in the emergency room. Repeat CT scan of the head done recently revealed large subacute partially hemorrhagic left frontal.  Tall temporal lobe infarction with surrounding edema and mild mass-effect upon the left lateral ventricle.  No significant change since yesterday's exam. Being managed by critical care team and neurology service. Palliative care had meeting with family today.  Family has decided on initiating withdrawal of care on Saturday afternoon once all family members arrive.  No escalation of care.  No hemodialysis.  No labs.  CODE STATUS changed to DNR.  With ultimate goal of keeping patient comfortable  10.  Nutrition Patient status post PEG tube placement following CVA recently at [facility]  11.  Hypernatremia Nursing staff about to initiate water to be administered via NG tube at 200 cc daily 6 hourly.  Being managed by critical care team.  DVT prophylaxis; SCDs   All the records are reviewed and case discussed with Care Management/Social Worker. Management plans discussed with the patient, family and they are in agreement.  CODE STATUS: DNR  TOTAL TIME TAKING CARE OF THIS PATIENT: 35 minutes.   More than 50% of the time was spent in counseling/coordination of care: YES  POSSIBLE D/C IN 2 DAYS, DEPENDING ON CLINICAL  CONDITION.   Dalanie Kisner M.D on 06/18/2018 at 4:30 PM  Between 7am to 6pm - Pager - [phone]  After 6pm go to [email or web address] - Proofreader  [facility]  Office  [phone]  CC: Primary care physician; System, Pcp Not In  Note: This dictation was prepared with Dragon dictation along with smaller phrase technology. Any transcriptional errors that result from this process are unintentional.

## 2018-06-19 LAB — CULTURE, BLOOD (ROUTINE X 2)
CULTURE: NO GROWTH
Culture: NO GROWTH
Special Requests: ADEQUATE
Special Requests: ADEQUATE

## 2018-06-19 MED ORDER — GLYCOPYRROLATE 1 MG PO TABS
1.0000 mg | ORAL_TABLET | ORAL | Status: DC | PRN
  Filled 2018-06-19: qty 1

## 2018-06-19 MED ORDER — MORPHINE SULFATE (PF) 2 MG/ML IV SOLN: 2.0000 mg | INTRAVENOUS | Status: DC | PRN

## 2018-06-19 MED ORDER — POLYVINYL ALCOHOL 1.4 % OP SOLN
1.0000 [drp] | Freq: Four times a day (QID) | OPHTHALMIC | Status: DC | PRN
  Filled 2018-06-19: qty 15

## 2018-06-19 MED ORDER — DEXTROSE 5 % IV SOLN: INTRAVENOUS | Status: DC

## 2018-06-19 MED ORDER — MORPHINE SULFATE (PF) 4 MG/ML IV SOLN: 4.0000 mg | Freq: Once | INTRAVENOUS | Status: DC

## 2018-06-19 MED ORDER — ACETAMINOPHEN 325 MG PO TABS: 650.0000 mg | ORAL_TABLET | Freq: Four times a day (QID) | ORAL | Status: DC | PRN

## 2018-06-19 MED ORDER — MORPHINE SULFATE (PF) 2 MG/ML IV SOLN
2.0000 mg | INTRAVENOUS | Status: DC | PRN
  Administered 2018-06-20: 2 mg via INTRAVENOUS
  Filled 2018-06-19: qty 1

## 2018-06-19 MED ORDER — DIPHENHYDRAMINE HCL 50 MG/ML IJ SOLN: 25.0000 mg | INTRAMUSCULAR | Status: DC | PRN

## 2018-06-19 MED ORDER — GLYCOPYRROLATE 0.2 MG/ML IJ SOLN
0.2000 mg | INTRAMUSCULAR | Status: DC | PRN
  Filled 2018-06-19: qty 1

## 2018-06-19 MED ORDER — MORPHINE 100MG IN NS 100ML (1MG/ML) PREMIX INFUSION: 0.0000 mg/h | INTRAVENOUS | Status: DC

## 2018-06-19 MED ORDER — MORPHINE BOLUS VIA INFUSION
5.0000 mg | INTRAVENOUS | Status: DC | PRN
  Filled 2018-06-19: qty 5

## 2018-06-19 MED ORDER — MORPHINE SULFATE (PF) 4 MG/ML IV SOLN
INTRAVENOUS | Status: AC
  Administered 2018-06-19: 4 mg
  Filled 2018-06-19: qty 1

## 2018-06-19 MED ORDER — ACETAMINOPHEN 650 MG RE SUPP: 650.0000 mg | Freq: Four times a day (QID) | RECTAL | Status: DC | PRN

## 2018-06-19 NOTE — Progress Notes (Signed)
Family is ready for Comfort care measures Son at bedside updated.  Orders placed.

## 2018-06-19 NOTE — Progress Notes (Signed)
Telephone report called to [facility] on 1C.  Patient transported to 1C via bed by Beverely Low nurse aid

## 2018-06-19 NOTE — Progress Notes (Signed)
Patient extubated to room air per MD order.  Patient changed to comfort care.  Family at bedside.

## 2018-06-19 NOTE — Progress Notes (Signed)
CRITICAL CARE NOTE  CC  follow up respiratory failure  SUBJECTIVE Patient remains critically ill Prognosis is guarded Multiorgan failure  Plan for Comfort care measures today    SIGNIFICANT EVENTS   REVIEW OF SYSTEMS  PATIENT IS UNABLE TO PROVIDE COMPLETE REVIEW OF SYSTEMS DUE TO SEVERE CRITICAL ILLNESS   PHYSICAL EXAMINATION:  GENERAL:critically ill appearing, +resp distress HEAD: Normocephalic, atraumatic.  EYES: Pupils equal, round, reactive to light.  No scleral icterus.  MOUTH: Moist mucosal membrane. NECK: Supple. No thyromegaly. No nodules. No JVD.  PULMONARY: +rhonchi, +wheezing CARDIOVASCULAR: S1 and S2. Regular rate and rhythm. No murmurs, rubs, or gallops.  GASTROINTESTINAL: Soft, nontender, -distended. No masses. Positive bowel sounds. No hepatosplenomegaly.  MUSCULOSKELETAL: No swelling, clubbing, or edema.  NEUROLOGIC: obtunded, GCS<8 SKIN:intact,warm,dry    CULTURE RESULTS   Recent Results (from the past 240 hour(s))  Urine culture     Status: Abnormal   Collection Time: 06/10/2018  5:41 PM  Result Value Ref Range Status   Specimen Description   Final    URINE, RANDOM Performed at [facility], [address], [place], [place] [address]    Special Requests   Final    NONE Performed at [facility], [address] [place], [place] [address]    Culture (A)  Final    >=100,000 COLONIES/mL ESCHERICHIA COLI Confirmed Extended Spectrum Beta-Lactamase Producer (ESBL).  In bloodstream infections from ESBL organisms, carbapenems are preferred over piperacillin/tazobactam. They are shown to have a lower risk of mortality.    Report Status 06/17/2018 FINAL  Final   Organism ID, Bacteria ESCHERICHIA COLI (A)  Final      Susceptibility   Escherichia coli - MIC*    AMPICILLIN >=32 RESISTANT Resistant     CEFAZOLIN >=64 RESISTANT Resistant     CEFTRIAXONE >=64 RESISTANT Resistant     CIPROFLOXACIN >=4 RESISTANT Resistant     GENTAMICIN  <=1 SENSITIVE Sensitive     IMIPENEM <=0.25 SENSITIVE Sensitive     NITROFURANTOIN <=16 SENSITIVE Sensitive     TRIMETH/SULFA >=320 RESISTANT Resistant     AMPICILLIN/SULBACTAM >=32 RESISTANT Resistant     PIP/TAZO 16 SENSITIVE Sensitive     Extended ESBL POSITIVE Resistant     * >=100,000 COLONIES/mL ESCHERICHIA COLI  Blood Culture (routine x 2)     Status: None   Collection Time: 06/17/2018  6:01 PM  Result Value Ref Range Status   Specimen Description BLOOD LEFT ANTECUBITAL  Final   Special Requests   Final    BOTTLES DRAWN AEROBIC AND ANAEROBIC Blood Culture adequate volume   Culture   Final    NO GROWTH 5 DAYS Performed at [facility], [address], [place], [place] [address]    Report Status 06/19/2018 FINAL  Final  Blood Culture (routine x 2)     Status: None   Collection Time: 06/12/2018  6:01 PM  Result Value Ref Range Status   Specimen Description BLOOD BLOOD RIGHT WRIST  Final   Special Requests   Final    BOTTLES DRAWN AEROBIC AND ANAEROBIC Blood Culture adequate volume   Culture   Final    NO GROWTH 5 DAYS Performed at [facility], [address] [place], [place] [address]    Report Status 06/19/2018 FINAL  Final  MRSA PCR Screening     Status: None   Collection Time: 06/15/18 12:50 AM  Result Value Ref Range Status   MRSA by PCR NEGATIVE NEGATIVE Final    Comment:  The GeneXpert MRSA Assay (FDA approved for NASAL specimens only), is one component of a comprehensive MRSA colonization surveillance program. It is not intended to diagnose MRSA infection nor to guide or monitor treatment for MRSA infections. Performed at [facility], [address] [place], [place] [address]   Culture, respiratory (non-expectorated)     Status: None   Collection Time: 06/16/18  9:34 AM  Result Value Ref Range Status   Specimen Description   Final    TRACHEAL ASPIRATE Performed at [facility], [address],  [place], [place] [address]    Special Requests   Final    Normal Performed at [facility], [address] [place], [place] [address]    Gram Stain   Final    MODERATE WBC PRESENT, PREDOMINANTLY PMN NO ORGANISMS SEEN    Culture   Final    FEW Consistent with normal respiratory flora. Performed at [facility], [place] [address] [place], [place] [address]    Report Status 06/18/2018 FINAL  Final          IMAGING    No results found.    Indwelling Urinary Catheter continued, requirement due to   Reason to continue Indwelling Urinary Catheter for strict Intake/Output monitoring for hemodynamic instability         Ventilator continued, requirement due to, resp failure    Ventilator Sedation RASS 0 to -2     ASSESSMENT AND PLAN SYNOPSIS  ACUTE RESP FAILURE FROM ACUTE NEW CVA WITH SEVERE ENCEPHALOPATHYwith PROGRESSIVE MULTIORGAN FAILURE   Severe Hypoxic and Hypercapnic Respiratory Failure -continue Full MV support   Renal Failure -follow chem 7 -follow UO -continue Foley Catheter-assess need daily   NEUROLOGY Severe encephalopathy    CARDIAC ICU monitoring  ID -continue IV abx as prescibed -follow up cultures   ENDO - ICU hypoglycemic\Hyperglycemia protocol -check FSBS per protocol   ELECTROLYTES -follow labs as needed -replace as needed -pharmacy consultation and following   DVT/GI PRX ordered TRANSFUSIONS AS NEEDED MONITOR FSBS ASSESS the need for LABS as needed   clinical status relayed to family  Updated and notified of patients medical condition-  Progressive multiorgan failure with very low chance of meaningful recovery.  Patient is in dying  Process.  Family understands the situation.  They have consented and agreed to DNR/DNI and would like to proceed with Comfort care measures-later today as per family       Critical Care Time devoted to patient care services described in this note is 34 minutes.    Overall, patient is critically ill, prognosis is guarded.  Patient with Multiorgan failure and at high risk for cardiac arrest and death.    Corrin Parker, M.D.  [facility]  Medical Director [place] Director [facility] Cardio-Pulmonary Department

## 2018-06-19 NOTE — Progress Notes (Signed)
I didn't see him today. Noted ICU team orders for comfort care and terminal extubation.

## 2018-06-19 NOTE — Progress Notes (Signed)
Patient was extubated to room air per Dr. Zoila Shutter order.

## 2018-06-20 MED ORDER — ACETAMINOPHEN 650 MG RE SUPP
650.0000 mg | RECTAL | Status: DC | PRN
  Administered 2018-06-20: 22:00:00 650 mg via RECTAL

## 2018-06-20 NOTE — Clinical Social Work Note (Signed)
Clinical Social Work Assessment  Patient Details  Name: Alex Vance MRN: [id number] Date of Birth: 05/16/1945  Date of referral:  06/20/18               Reason for consult:  End of Life/Hospice                Permission sought to share information with:    Permission granted to share information::     Name::        Agency::     Relationship::     Contact Information:     Housing/Transportation Living arrangements for the past 2 months:  Single Family Home Source of Information:  Siblings Patient Interpreter Needed:  None Criminal Activity/Legal Involvement Pertinent to Current Situation/Hospitalization:  No - Comment as needed Significant Relationships:  Siblings, Adult Children Lives with:    Do you feel safe going back to the place where you live?  (patient unable to communicate and family wish hospice home) Need for family participation in patient care:  Yes (Comment)  Care giving concerns:  Patient from home.    Social Worker assessment / plan:  MD contacted CSW to inform that he believes patient is hospice home appropriate and that this is what family desires. CSW spoke with patient's sister in patient's room this morning and provided choice. Patient's sister confirmed that the family's wishes are for [place]. CSW has sent referral to the hospice home intake referral line.   Employment status:  Disabled (Comment on whether or not currently receiving Disability) Insurance information:  Medicare PT Recommendations:    Information / Referral to community resources:     Patient/Family's Response to care:  Patient unable to respond. Patient's sister was very gracious of [place] visit.  Patient/Family's Understanding of and Emotional Response to Diagnosis, Current Treatment, and Prognosis:  Patient's sister is at peace with patient's current plan.   Emotional Assessment Appearance:  Appears older than stated age Attitude/Demeanor/Rapport:   Unresponsive Affect (typically observed):  Unable to Assess Orientation:  (unresponsive ) Alcohol / Substance use:  Not Applicable Psych involvement (Current and /or in the community):  No (Comment)  Discharge Needs  Concerns to be addressed:  Care Coordination Readmission within the last 30 days:  No Current discharge risk:  None Barriers to Discharge:  Hospice Bed not available   Shela Leff, LCSW 06/20/2018, 9:50 AM

## 2018-06-20 NOTE — Progress Notes (Signed)
[place] [facility] [place] NAME: Mcguire Gasparyan    MR#:  [id number]  DATE OF BIRTH:  1945-11-27  SUBJECTIVE:  CHIEF COMPLAINT:   Chief Complaint  Patient presents with  . unresponsive  actively dying. Sister wanted to start TFs and I've explained that goes against his/family wishes for no artificial means of living. She is in agreement to hold off REVIEW OF SYSTEMS:  ROS Unobtainable due to mental status   DRUG ALLERGIES:   Allergies  Allergen Reactions  . Penicillins Rash    Has patient had a PCN reaction causing immediate rash, facial/tongue/throat swelling, SOB or lightheadedness with hypotension: Yes Has patient had a PCN reaction causing severe rash involving mucus membranes or skin necrosis: No Has patient had a PCN reaction that required hospitalization No Has patient had a PCN reaction occurring within the last 10 years: No If all of the above answers are "NO", then may proceed with Cephalosporin use.   VITALS:  Blood pressure 132/69, pulse 95, temperature 97.7 F (36.5 C), resp. rate 20, height 6\' 1"  (1.854 m), weight 83.9 kg, SpO2 95 %. PHYSICAL EXAMINATION:  Physical Exam  Constitutional: He appears well-developed.  HENT:  Head: Normocephalic and atraumatic.  Mouth/Throat: Oropharynx is clear and moist.  Eyes: Pupils are equal, round, and reactive to light. Conjunctivae and EOM are normal. Right eye exhibits no discharge.  Neck: Normal range of motion. Neck supple. No tracheal deviation present.  Cardiovascular: Normal rate, regular rhythm and normal heart sounds.  Respiratory: Effort normal and breath sounds normal. He has no wheezes.  GI: Bowel sounds are normal. There is no abdominal tenderness.  Musculoskeletal:        General: No edema.  Neurological: He is unresponsive.  Unable to evaluate due to current mental status  Skin: Skin is warm. He is not diaphoretic. No erythema.   LABORATORY PANEL:   Male CBC Recent Labs  Lab 06/17/18 0538  WBC 29.2*  HGB 9.3*  HCT 30.7*  PLT 172   ------------------------------------------------------------------------------------------------------------------ Chemistries  Recent Labs  Lab 06/16/18 0604  06/17/18 0538  NA 165*  --  165*  K 5.2*   < > 4.8  CL >130*  --  >130*  CO2 20*  --  18*  GLUCOSE 282*  --  403*  BUN 175*  --  178*  CREATININE 3.89*  --  3.74*  CALCIUM 7.8*  --  8.7*  MG  --    < > 3.7*  AST 81*  --   --   ALT 145*  --   --   ALKPHOS 129*  --   --   BILITOT 1.4*  --   --    < > = values in this interval not displayed.   RADIOLOGY:  No results found. ASSESSMENT AND PLAN:   1.  Acute hypoxic and hypercapnic respiratory failure 2. Severe sepsis secondary to UTI 3.  Acute on chronic kidney disease stage 3 4.  Diabetes mellitus type 2 5.  Chronic systolic CHF 6.  Hypertension 7.  Chronic atrial fibrillation 8.  Right scrotal hydrocele 9.  Recent acute CVA with hemorrhagic conversion 10.  Nutrition: Patient status post PEG tube placement following CVA recently at [facility] 11.  Hypernatremia    All the records are reviewed and case discussed with Care Management/Social Worker. Management plans discussed with the patient, family (sister in law at bedside) and they are in agreement.  CODE STATUS: DNR  TOTAL TIME  TAKING CARE OF THIS PATIENT: 35 minutes.   More than 50% of the time was spent in counseling/coordination of care: YES  POSSIBLE D/C Today if hospice home bed available   Max Sane M.D on 06/20/2018 at 11:30 AM  Between 7am to 6pm - Pager - [phone]  After 6pm go to [email or web address] - Proofreader  [facility]  Office  [phone]  CC: Primary care physician; System, Pcp Not In  Note: This dictation was prepared with Dragon dictation along with smaller phrase technology. Any transcriptional errors that result from this process are unintentional.

## 2018-06-20 NOTE — Progress Notes (Addendum)
  Family Meeting Note  Advance Directive:yes  Today a meeting took place with the Patient and Sister at bedside.  Patient is unable to participate due TX:HFSFSE capacity Comatose   The following clinical team members were present during this meeting:MD  The following were discussed:Patient's diagnosis: 48 y m with below listed medical problems admitted for Acute hypoxic and hypercapnic respiratory failure Secondary to hemorrhagic CVA, Severe sepsis secondary to UTI, Acute on chronic kidney disease stage 3,  Chronic systolic CHF, Hypertension; Chronic atrial fibrillation, Right scrotal hydrocele  Repeat CT scan of the head done after admission revealed large subacute partially hemorrhagic left frontal.  Tall temporal lobe infarction with surrounding edema and mild mass-effect upon the left lateral ventricle.    Family has decided on comfort care.  No escalation of care.  No hemodialysis.  No labs.  CODE STATUS changed to DNR.     Patient actively dying. Comfort care orders in place. Sister at bedside and updated.   Will await CSW/CM to find Hospice Home beds availability. If beds available - he can be released there.  Past Medical History:  Diagnosis Date  . Chronic atrial fibrillation    a. on Xarelto  . Diabetes mellitus without complication ([place])   . Hyperlipemia   . Hypertension   . Nonischemic cardiomyopathy [facility])    a. s/p [employer] ICD placement 2008, generator change 11/2013  b. Echo 2015: EF 30-35%    Patient's progosis: < 2 weeks and Goals for treatment: DNR  Additional follow-up to be provided: Hospice Home if bed available today  Time spent during discussion:16 mins  Max Sane, MD

## 2018-06-20 DEATH — deceased

## 2018-07-21 NOTE — Progress Notes (Signed)
Patient's daughter Vito Backers called back and no family coming.  Patient transported to the morgue.  Clarise Cruz, BSN

## 2018-07-21 NOTE — Progress Notes (Signed)
Patient expired at [facility], verified by this nurse and Stacie Glaze, RN.  Made Dr. Posey Pronto and Lelon Frohlich, [facility] aware.  Notified patient daughter Jayvian Escoe of patient's passing.  Daughter not coming to the hospital and is unsure of which funeral home she wants.  Vito Backers is going to call her brother Kyi to make him aware of patient's death and they plan to discuss funeral arrangements in the morning.  Made her aware to call the [facility] tomorrow with that information.  Vito Backers will call me back if brother plans to come to the hospital.  COPA made aware and referral number is [phone]  Clarise Cruz, BSN

## 2018-07-21 NOTE — Death Summary Note (Signed)
DEATH SUMMARY   Patient Details  Name: Quadre Bristol MRN: [id number] DOB: 1946/01/01  Admission/Discharge Information   Admit Date:  Jun 18, 2018  Date of Death: Date of Death: 25-Jun-2018  Time of Death: Time of Death: 07/10/2022  Length of Stay: 7  Referring Physician: System, Pcp Not In   Reason(s) for Hospitalization  encephalopathy  Diagnoses  Preliminary cause of death: Acute Hemorrhagic CVA Secondary Diagnoses (including complications and co-morbidities):  Principal Problem:   Severe sepsis ([place]) Active Problems:   Type II diabetes mellitus with complication (HCC)   Chronic systolic CHF (congestive heart failure) (HCC)   HTN (hypertension)   Chronic atrial fibrillation   Acute lower UTI   Acute on chronic kidney failure (HCC)   Pressure injury of skin   Cerebrovascular accident (CVA) ([place])   Palliative care by specialist   Goals of care, counseling/discussion   Brief Hospital Course (including significant findings, care, treatment, and services provided and events leading to death)  Efraim Vanallen is a 73 y.o. year old male with past medical history of a fib on xarelto, T2DM, HLD, HTN, and nonischemic cardiomyopathy s/p ICD placement in 2008 admitted on 18-Jun-2018 unresponsive. He was recently discharged from a long hospital stay at [facility] after an ischemic stroke in Jan. Post stroke he was aphasic and nonambulatory. He was sent to [facility] for rehab. He had a PEG tube placed prior to discharge from [facility]. On admission to ED, patient was found to have a new stroke with early hemorrhagic transformation. He required intubation d/t AMS. Also found to have UTI and severe sepsis. Patient had been hypotensive and requiring IV vasopressors when was in ICU. Labs revealed renal failure and shock liver. Patient has been unable to wean from vent. Patient is unresponsive and on no sedation. Family decided for comfort care and Terminal extubation.  1.  Acute hypoxic and hypercapnic  respiratory failure 2.Severe sepsis secondary to UTI 3.  Acute on chronic kidney disease stage 3 4.  Diabetes mellitus type 2 5.  Chronic systolic CHF 6.  Hypertension 7.  Chronic atrial fibrillation 8.  Right scrotal hydrocele 9.  Recent acute CVA with hemorrhagic conversion 10.  Nutrition: Patient status post PEG tube placement following CVA recently at [facility] 11.  Hypernatremia  Pertinent Labs and Studies  Significant Diagnostic Studies Ct Head Wo Contrast  Result Date: 06/15/2018 CLINICAL DATA:  73 year old male. Follow-up CVA. Subsequent encounter. EXAM: CT HEAD WITHOUT CONTRAST TECHNIQUE: Contiguous axial images were obtained from the base of the skull through the vertex without intravenous contrast. COMPARISON:  2018/06/18. FINDINGS: Brain: Large subacute partially hemorrhagic left frontal-parietal-temporal lobe infarct with surrounding edema and mild mass-effect upon the left lateral ventricle (with minimal bowing of the septum towards the right) without significant change since yesterday's exam. Remote moderate size left parietal-occipital lobe infarct with encephalomalacia. Prominent chronic microvascular changes. Global atrophy. No intracranial mass lesion noted on this unenhanced exam. Vascular: Prominent vertebral artery and carotid artery cavernous segment calcifications. Skull: No acute abnormality. Sinuses/Orbits: No acute orbital abnormality. Mild mucosal thickening ethmoid sinus air cells and maxillary sinuses. Minimal mucosal thickening anterior sphenoid sinus air cells. Mastoid air cells and middle ear cavities are clear. Other: Prominent scalp vascular calcifications. IMPRESSION: 1. Large subacute partially hemorrhagic left frontal-parietal-temporal lobe infarct with surrounding edema and mild mass-effect upon the left lateral ventricle (with minimal bowing of the septum towards the right) without significant change since yesterday's exam. 2. Remote moderate size left  parietal-occipital lobe infarct with encephalomalacia. 3. Prominent chronic  microvascular changes. 4. Global atrophy. 5. Prominent vertebral artery and carotid artery cavernous segment calcifications. 6. Mild mucosal thickening ethmoid sinus air cells and maxillary sinuses. Minimal mucosal thickening anterior sphenoid sinus air cells. Electronically Signed   By: Genia Del M.D.   On: 06/15/2018 14:46   Ct Head Wo Contrast  Result Date: 06/10/2018 CLINICAL DATA:  Altered mental status. Unresponsive. EXAM: CT HEAD WITHOUT CONTRAST TECHNIQUE: Contiguous axial images were obtained from the base of the skull through the vertex without intravenous contrast. COMPARISON:  None. FINDINGS: Brain: Ill-defined edema centered within the LEFT frontotemporal lobe, with probable extension to the LEFT parietal lobe, highly suspicious for subacute infarction. Scattered hyperdense foci within the edema is most likely hemorrhagic conversion. Associated local mass effect with sulcal effacement. No midline shift or herniation. Additional small old infarcts within the LEFT frontal lobe and LEFT occipital lobe. Chronic small vessel ischemic changes noted within the bilateral periventricular and subcortical white matter regions. Vascular: Chronic calcified atherosclerotic changes of the large vessels at the skull base. No unexpected hyperdense vessel. Skull: Normal. Negative for fracture or focal lesion. Sinuses/Orbits: No acute finding. Other: None. IMPRESSION: 1. Ill-defined edema centered within the LEFT frontotemporal lobe, highly suspicious for subacute infarction. Scattered hyperdense foci within this presumed edema is compatible with acute hemorrhagic conversion, likely a combination of acute parenchymal and subarachnoid hemorrhage. There is associated local mass effect with sulcal effacement. No midline shift or herniation. 2. Additional chronic/incidental findings detailed above. Critical Value/emergent results were called  by telephone at the time of interpretation on 06/17/2018 at 8:10 pm to Dr. [name] , who verbally acknowledged these results. Electronically Signed   By: Franki Cabot M.D.   On: 05/30/2018 20:11   Dg Chest Port 1 View  Result Date: 06/16/2018 CLINICAL DATA:  Acute respiratory failure EXAM: PORTABLE CHEST 1 VIEW COMPARISON:  05/26/2018 FINDINGS: Cardiac shadow is stable. Defibrillator is again noted in satisfactory position. Endotracheal tube and gastric catheter are noted in satisfactory position. The lungs are well aerated bilaterally. No focal infiltrate or sizable effusion is seen. No acute bony abnormality is noted. IMPRESSION: Tubes and lines as described above.  No infiltrate is seen. Electronically Signed   By: Inez Catalina M.D.   On: 06/16/2018 07:35   Dg Chest Portable 1 View  Result Date: 05/26/2018 CLINICAL DATA:  Post intubation.  OG tube placement. EXAM: PORTABLE CHEST 1 VIEW COMPARISON:  05/31/2018 FINDINGS: Endotracheal tube has been placed and is 4 cm above the carina. OG tube tip is in the distal esophagus near the GE junction. Left AICD is unchanged. Mild cardiomegaly. Right basilar atelectasis or infiltrate. Left lung clear. No effusions or acute bony abnormality. IMPRESSION: Mild cardiomegaly.  Right base atelectasis or infiltrate. Endotracheal tube in expected position. OG tube tip is near the GE junction and could be advanced several cm for positioning into the stomach. Electronically Signed   By: Rolm Baptise M.D.   On: 06/17/2018 18:56   Dg Chest Port 1 View  Result Date: 06/03/2018 CLINICAL DATA:  Unresponsive, fever, atrial fibrillation, hypertension EXAM: PORTABLE CHEST 1 VIEW COMPARISON:  04/03/2018 FINDINGS: Stable cardiomegaly and vascular congestion. Chronic right hemidiaphragm elevation with basilar atelectasis. No definite focal pneumonia, collapse or consolidation. Negative for large effusion or pneumothorax. Trachea midline. Left subclavian defibrillator noted.  IMPRESSION: Cardiomegaly with vascular congestion. Basilar atelectasis. No significant interval change. Electronically Signed   By: Jerilynn Mages.  Shick M.D.   On: 06/17/2018 18:41   Dg Abd Portable 1 View  Result Date: 05/26/2018 CLINICAL DATA:  Post intubation.  OG tube placement EXAM: PORTABLE ABDOMEN - 1 VIEW COMPARISON:  None. FINDINGS: OG tube tip is near the GE junction. IMPRESSION: OG tube tip near the GE junction. This could be advanced several cm for positioning into the stomach. Electronically Signed   By: Rolm Baptise M.D.   On: 06/05/2018 18:57    Microbiology Recent Results (from the past 240 hour(s))  Urine culture     Status: Abnormal   Collection Time: 06/02/2018  5:41 PM  Result Value Ref Range Status   Specimen Description   Final    URINE, RANDOM Performed at [facility], [address], [place], [place] [address]    Special Requests   Final    NONE Performed at [facility], [address] [place], [place] [address]    Culture (A)  Final    >=100,000 COLONIES/mL ESCHERICHIA COLI Confirmed Extended Spectrum Beta-Lactamase Producer (ESBL).  In bloodstream infections from ESBL organisms, carbapenems are preferred over piperacillin/tazobactam. They are shown to have a lower risk of mortality.    Report Status 06/17/2018 FINAL  Final   Organism ID, Bacteria ESCHERICHIA COLI (A)  Final      Susceptibility   Escherichia coli - MIC*    AMPICILLIN >=32 RESISTANT Resistant     CEFAZOLIN >=64 RESISTANT Resistant     CEFTRIAXONE >=64 RESISTANT Resistant     CIPROFLOXACIN >=4 RESISTANT Resistant     GENTAMICIN <=1 SENSITIVE Sensitive     IMIPENEM <=0.25 SENSITIVE Sensitive     NITROFURANTOIN <=16 SENSITIVE Sensitive     TRIMETH/SULFA >=320 RESISTANT Resistant     AMPICILLIN/SULBACTAM >=32 RESISTANT Resistant     PIP/TAZO 16 SENSITIVE Sensitive     Extended ESBL POSITIVE Resistant     * >=100,000 COLONIES/mL ESCHERICHIA COLI  Blood Culture (routine x 2)      Status: None   Collection Time: 06/01/2018  6:01 PM  Result Value Ref Range Status   Specimen Description BLOOD LEFT ANTECUBITAL  Final   Special Requests   Final    BOTTLES DRAWN AEROBIC AND ANAEROBIC Blood Culture adequate volume   Culture   Final    NO GROWTH 5 DAYS Performed at [facility], [address] [place], [place] [address]    Report Status 06/19/2018 FINAL  Final  Blood Culture (routine x 2)     Status: None   Collection Time: 06/01/2018  6:01 PM  Result Value Ref Range Status   Specimen Description BLOOD BLOOD RIGHT WRIST  Final   Special Requests   Final    BOTTLES DRAWN AEROBIC AND ANAEROBIC Blood Culture adequate volume   Culture   Final    NO GROWTH 5 DAYS Performed at [facility], [address] [place], [place] [address]    Report Status 06/19/2018 FINAL  Final  MRSA PCR Screening     Status: None   Collection Time: 06/15/18 12:50 AM  Result Value Ref Range Status   MRSA by PCR NEGATIVE NEGATIVE Final    Comment:        The GeneXpert MRSA Assay (FDA approved for NASAL specimens only), is one component of a comprehensive MRSA colonization surveillance program. It is not intended to diagnose MRSA infection nor to guide or monitor treatment for MRSA infections. Performed at [facility], [address] [place], [place] [address]   Culture, respiratory (non-expectorated)     Status: None   Collection Time: 06/16/18  9:34 AM  Result Value Ref Range Status   Specimen Description   Final    TRACHEAL ASPIRATE Performed at [facility], [address] [place], [place] [address]    Special Requests   Final    Normal Performed at [facility], [address] [place], [place] [address]    Gram Stain   Final    MODERATE WBC PRESENT, PREDOMINANTLY PMN NO ORGANISMS SEEN    Culture   Final    FEW Consistent with normal respiratory flora. Performed at [facility], [place] [address] [place], [place]  [address]    Report Status 06/18/2018 FINAL  Final    Lab Basic Metabolic Panel: Recent Labs  Lab 06/15/18 0550 06/16/18 0604 06/17/18 0056 06/17/18 0538  NA 159* 165*  --  165*  K 4.9 5.2* 5.0 4.8  CL 130* >130*  --  >130*  CO2 21* 20*  --  18*  GLUCOSE 412* 282*  --  403*  BUN 162* 175*  --  178*  CREATININE 3.26* 3.89*  --  3.74*  CALCIUM 7.4* 7.8*  --  8.7*  MG 3.3*  --  3.6* 3.7*   Liver Function Tests: Recent Labs  Lab 06/16/18 0604  AST 81*  ALT 145*  ALKPHOS 129*  BILITOT 1.4*  PROT 6.0*  ALBUMIN 2.3*   No results for input(s): LIPASE, AMYLASE in the last 168 hours. Recent Labs  Lab 06/16/18 0604  AMMONIA 13   CBC: Recent Labs  Lab 06/15/18 0550 06/16/18 0604 06/17/18 0538  WBC 26.3* 33.1* 29.2*  HGB 10.9* 10.1* 9.3*  HCT 37.0* 34.4* 30.7*  MCV 84.3 84.7 81.6  PLT 192 184 172   Cardiac Enzymes: Recent Labs  Lab 06/17/18 0056 06/17/18 0538  TROPONINI 0.19* 0.18*   Sepsis Labs: Recent Labs  Lab 05/23/2018 2130 06/15/18 0550 06/15/18 2215 06/16/18 0604 06/17/18 0538  PROCALCITON  --   --  2.62  --   --   WBC  --  26.3*  --  33.1* 29.2*  LATICACIDVEN 2.4*  --   --   --   --     Procedures/Operations  none   Celinda Dethlefs Manuella Ghazi 07/05/18, 8:02 PM

## 2018-07-21 DEATH — deceased

## 2020-11-26 IMAGING — CT CT ANGIO CHEST
2 of 6 series · 18 of 46 positions shown · IV contrast (APPLIED)
Comparison: None.

CLINICAL DATA: Shortness of breath and lower leg swelling. Elevated
D-dimer.

EXAM:
CT ANGIOGRAPHY CHEST WITH CONTRAST
TECHNIQUE: Multidetector CT imaging of the chest was performed using the
standard protocol during bolus administration of intravenous
contrast. Multiplanar CT image reconstructions and MIPs were
obtained to evaluate the vascular anatomy.
CONTRAST:  75mL OMNIPAQUE IOHEXOL 350 MG/ML SOLN

[Series 7: thins · axial · 0.91mm/px · z∈[-304,-30]mm · 15 of 299 slices shown]
[im 13/299  lung]
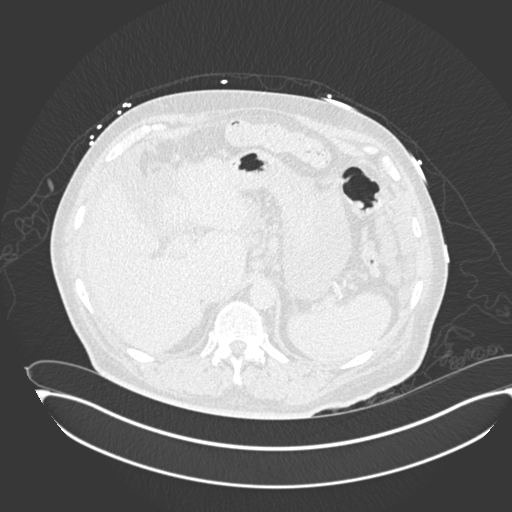
[im 39/299  soft-tissue]
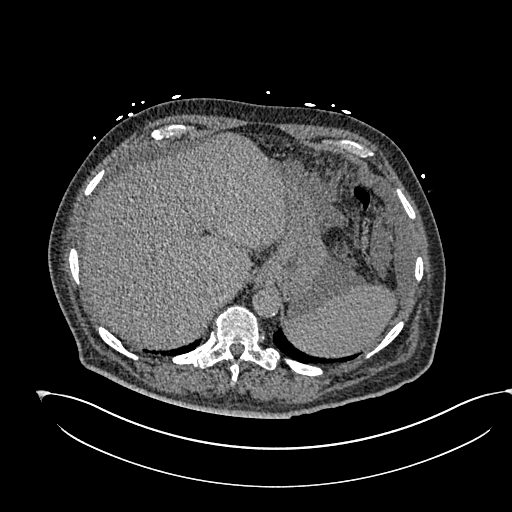
[im 52/299  lung]
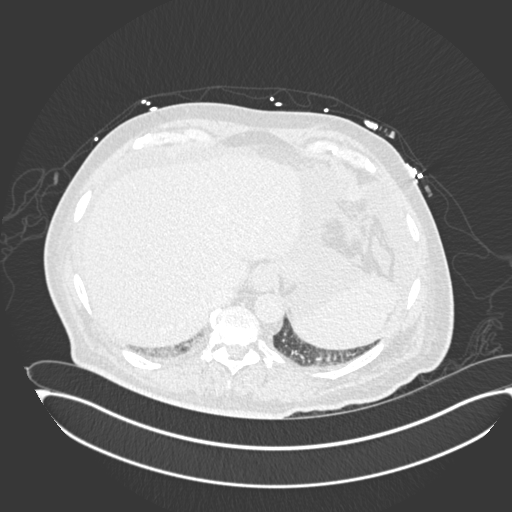
[im 78/299  soft-tissue]
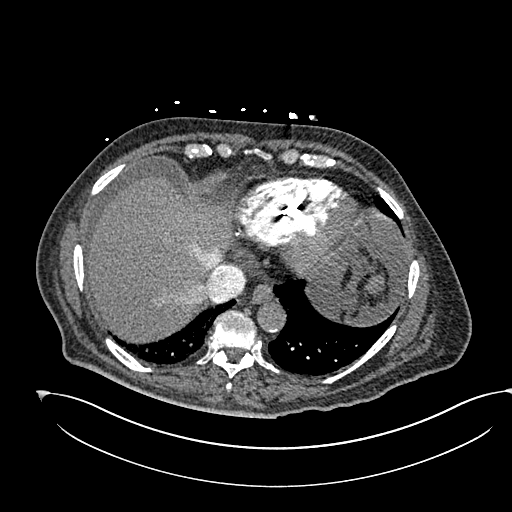
[im 91/299  lung]
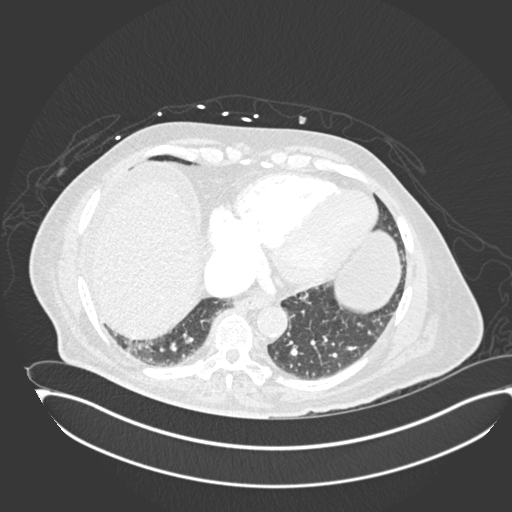
[im 117/299  soft-tissue]
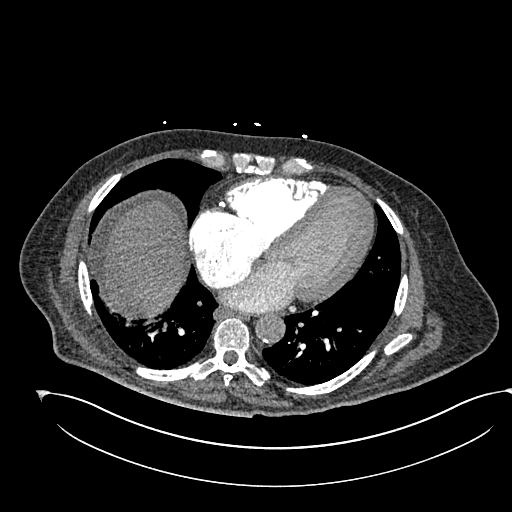
[im 130/299  lung]
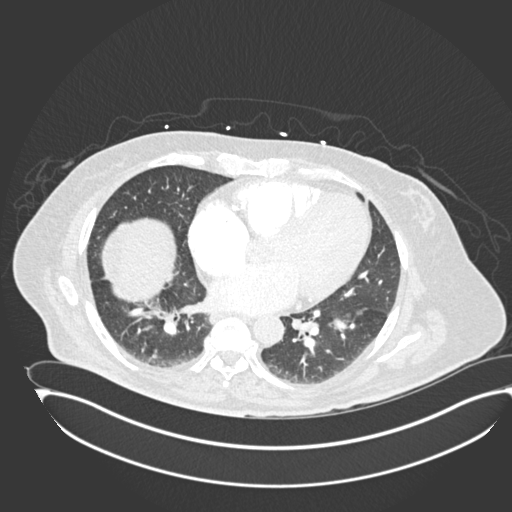
[im 156/299  soft-tissue]
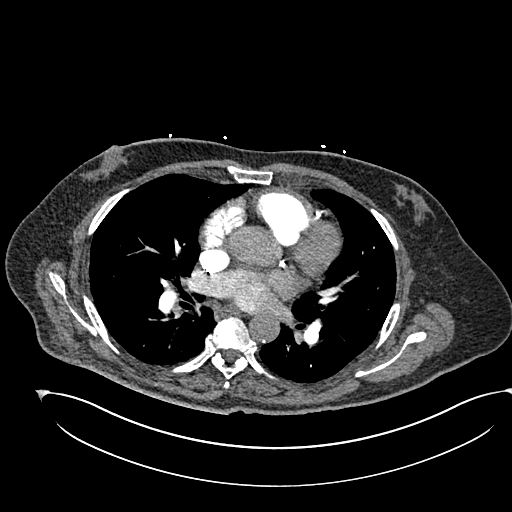
[im 169/299  lung]
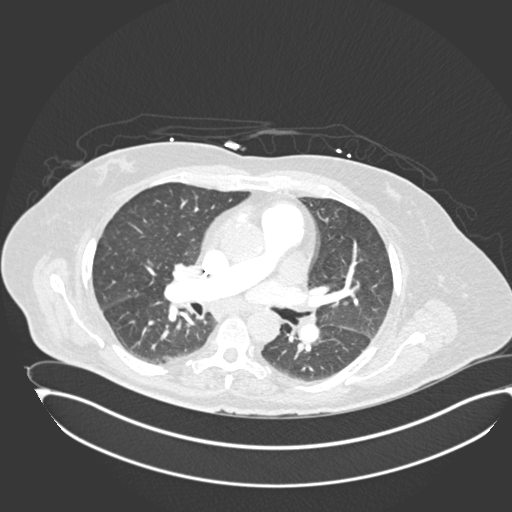
[im 182/299  soft-tissue]
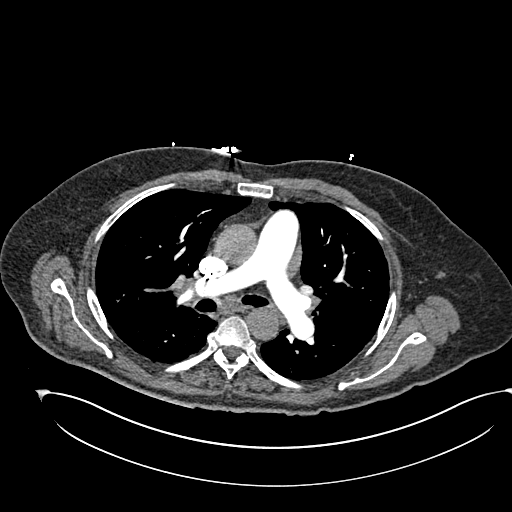
[im 208/299  lung]
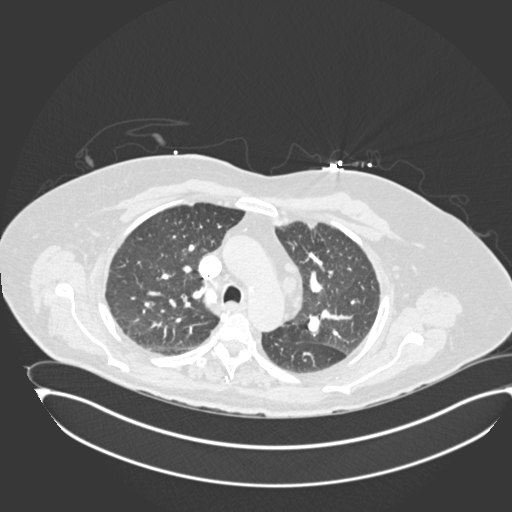
[im 221/299  soft-tissue]
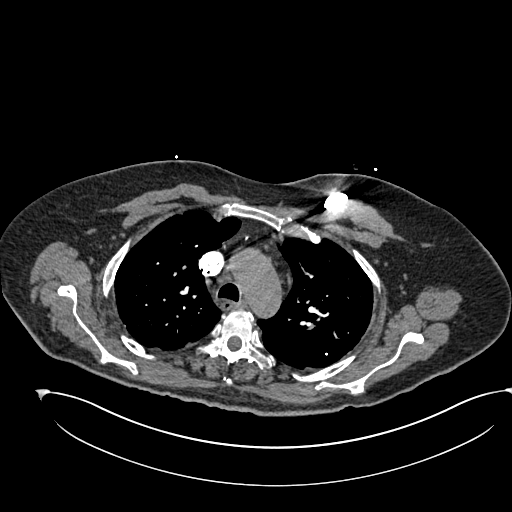
[im 247/299  lung]
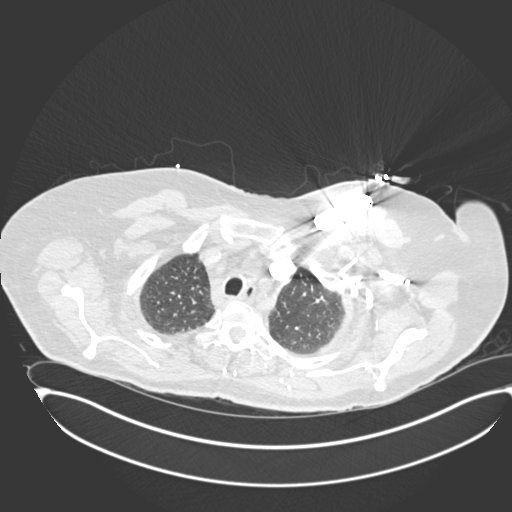
[im 260/299  soft-tissue]
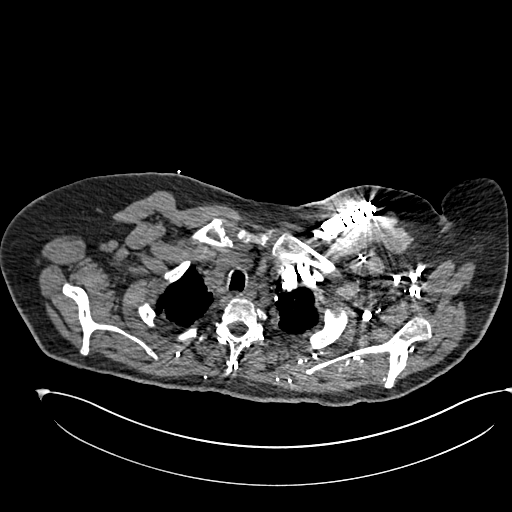
[im 286/299  lung]
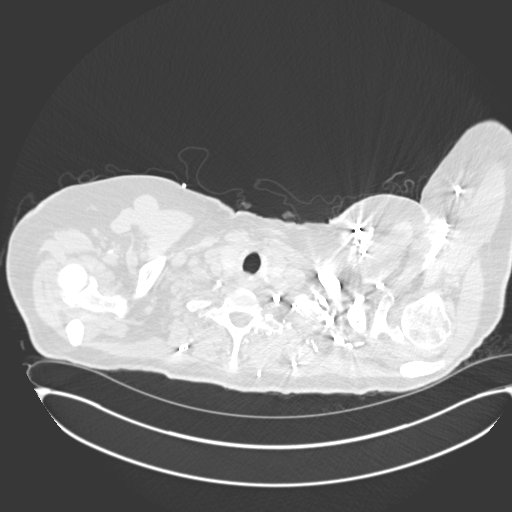

[Series 9: coronal mpr · coronal · 0.58mm/px · 3 of 94 slices shown]
[im 24/94  soft-tissue]
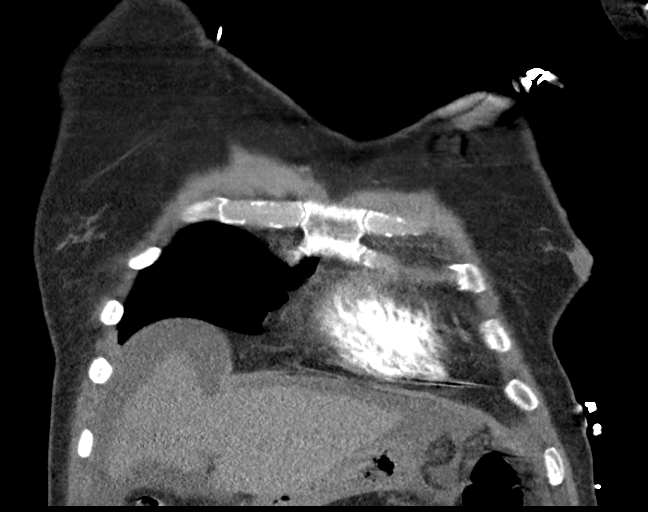
[im 47/94  soft-tissue]
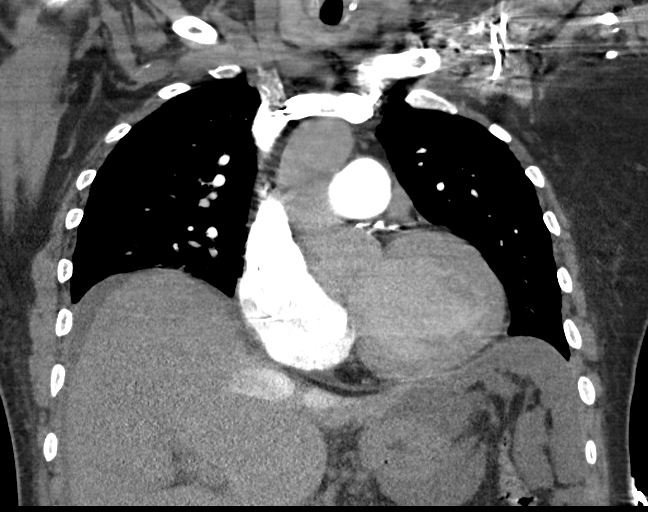
[im 70/94  soft-tissue]
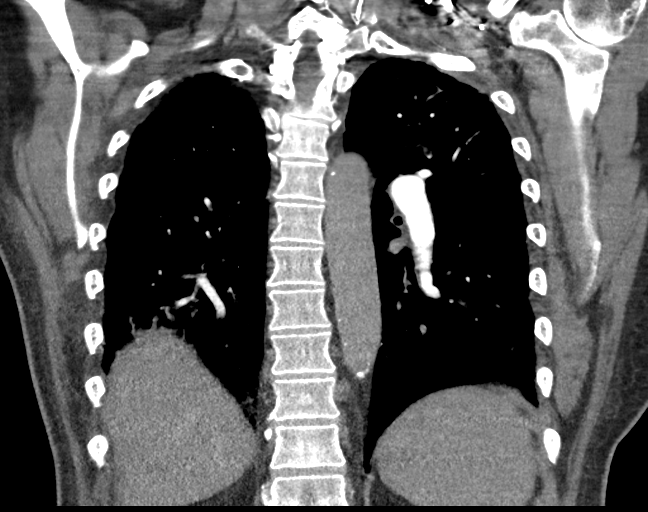

[18 of 46 positions shown; findings below may reference images not displayed]

FINDINGS: Cardiovascular: Satisfactory opacification of the pulmonary arteries
to the segmental level. No central obstructing pulmonary embolus.
There is right atrial and right ventricular enlargement with reflux
of contrast into the IVC and hepatic veins. Aortic atherosclerosis.
Calcification in the left main, lad, left circumflex and RCA noted.

Mediastinum/Nodes: The trachea appears patent and is midline. Normal
appearance of the esophagus. Normal appearance of the thyroid gland.
Prominent mediastinal lymph nodes without adenopathy. No
supraclavicular or axillary adenopathy

Lungs/Pleura: No pleural effusion, airspace consolidation or
atelectasis. No pneumothorax. Stable 2 mm left lower lobe lung
nodule, image 65/8. Anterior right lung nodule measures 3 mm, image
46/8. Not seen previously.

Upper Abdomen: Ascites noted within the upper abdomen.

Musculoskeletal: No chest wall abnormality. No acute or significant
osseous findings.

Review of the MIP images confirms the above findings.
IMPRESSION: 1. No evidence for acute pulmonary embolus.
2. Enlargement of the right heart with reflux of contrast material
into the IVC and hepatic veins. Findings are compatible with passive
venous congestion and may reflect right heart failure.
3. Abdominal ascites, new from previous exam. Etiology
indeterminate. This could be related to right heart failure.
Clinical correlation advised.
4. Aortic atherosclerosis and multi vessel coronary artery
atherosclerotic calcifications including left main disease.
